# Patient Record
Sex: Male | Born: 1960
Health system: Southern US, Community
[De-identification: ages and names within clinical notes are randomized; demographics above are authoritative.]

## PROBLEM LIST (undated history)

## (undated) DIAGNOSIS — R519 Headache, unspecified: Secondary | ICD-10-CM

## (undated) DIAGNOSIS — Z973 Presence of spectacles and contact lenses: Secondary | ICD-10-CM

## (undated) DIAGNOSIS — R229 Localized swelling, mass and lump, unspecified: Secondary | ICD-10-CM

## (undated) DIAGNOSIS — IMO0002 Reserved for concepts with insufficient information to code with codable children: Secondary | ICD-10-CM

## (undated) DIAGNOSIS — R51 Headache: Secondary | ICD-10-CM

## (undated) DIAGNOSIS — Z923 Personal history of irradiation: Secondary | ICD-10-CM

## (undated) HISTORY — PX: WISDOM TOOTH EXTRACTION: SHX21

---
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
## 2009-01-26 ENCOUNTER — Ambulatory Visit: Payer: Self-pay | Admitting: Licensed Clinical Social Worker

## 2009-02-09 ENCOUNTER — Ambulatory Visit: Payer: Self-pay | Admitting: Licensed Clinical Social Worker

## 2009-02-16 ENCOUNTER — Ambulatory Visit: Payer: Self-pay | Admitting: Licensed Clinical Social Worker

## 2009-02-23 ENCOUNTER — Ambulatory Visit: Payer: Self-pay | Admitting: Licensed Clinical Social Worker

## 2009-03-02 ENCOUNTER — Ambulatory Visit: Payer: Self-pay | Admitting: Licensed Clinical Social Worker

## 2009-03-16 ENCOUNTER — Ambulatory Visit: Payer: Self-pay | Admitting: Licensed Clinical Social Worker

## 2017-03-20 DIAGNOSIS — C109 Malignant neoplasm of oropharynx, unspecified: Secondary | ICD-10-CM

## 2017-03-20 HISTORY — DX: Malignant neoplasm of oropharynx, unspecified: C10.9

## 2017-06-20 DIAGNOSIS — H524 Presbyopia: Secondary | ICD-10-CM | POA: Diagnosis not present

## 2017-10-26 ENCOUNTER — Encounter: Payer: Self-pay | Admitting: Family Medicine

## 2017-10-26 ENCOUNTER — Ambulatory Visit: Payer: BLUE CROSS/BLUE SHIELD | Admitting: Family Medicine

## 2017-10-26 VITALS — BP 120/76 | HR 71 | Temp 97.6°F | Resp 12 | Ht 71.0 in | Wt 174.2 lb

## 2017-10-26 DIAGNOSIS — Z0000 Encounter for general adult medical examination without abnormal findings: Secondary | ICD-10-CM | POA: Diagnosis not present

## 2017-10-26 DIAGNOSIS — J358 Other chronic diseases of tonsils and adenoids: Secondary | ICD-10-CM | POA: Diagnosis not present

## 2017-10-26 DIAGNOSIS — R590 Localized enlarged lymph nodes: Secondary | ICD-10-CM

## 2017-10-26 LAB — CBC WITH DIFFERENTIAL/PLATELET
Basophils Absolute: 0 10*3/uL (ref 0.0–0.1)
Basophils Relative: 0.4 % (ref 0.0–3.0)
Eosinophils Absolute: 0.1 10*3/uL (ref 0.0–0.7)
Eosinophils Relative: 0.9 % (ref 0.0–5.0)
HCT: 45.2 % (ref 39.0–52.0)
Hemoglobin: 15.3 g/dL (ref 13.0–17.0)
Lymphocytes Relative: 18.8 % (ref 12.0–46.0)
Lymphs Abs: 1.1 10*3/uL (ref 0.7–4.0)
MCHC: 33.7 g/dL (ref 30.0–36.0)
MCV: 85 fl (ref 78.0–100.0)
Monocytes Absolute: 0.4 10*3/uL (ref 0.1–1.0)
Monocytes Relative: 7.2 % (ref 3.0–12.0)
Neutro Abs: 4.4 10*3/uL (ref 1.4–7.7)
Neutrophils Relative %: 72.7 % (ref 43.0–77.0)
Platelets: 180 10*3/uL (ref 150.0–400.0)
RBC: 5.32 Mil/uL (ref 4.22–5.81)
RDW: 13.8 % (ref 11.5–15.5)
WBC: 6.1 10*3/uL (ref 4.0–10.5)

## 2017-10-26 LAB — POCT RAPID STREP A (OFFICE): Rapid Strep A Screen: NEGATIVE

## 2017-10-26 MED ORDER — AMOXICILLIN 500 MG PO CAPS: 500.0000 mg | ORAL_CAPSULE | Freq: Two times a day (BID) | ORAL | 0 refills | Status: AC

## 2017-10-26 NOTE — Patient Instructions (Addendum)
A few things to remember from today's visit:   Cervical adenopathy - Plan: amoxicillin (AMOXIL) 500 MG capsule, CBC with Differential/Platelet  Tonsillar erythema - Plan: POC Rapid Strep A, Culture, Group A Strep   Please be sure medication list is accurate. If a new problem present, please set up appointment sooner than planned today.

## 2017-10-26 NOTE — Progress Notes (Signed)
HPI:   Thomas Mccall is a 57 y.o. male, who is here today to establish care.  Former PCP: N/A Last preventive routine visit: Has not had one in many years.  Chronic medical problems: Otherwise healthy.Denies Hx of DM,HLD,or HTN. He takes no medications.   Concerns today: "Lump" in right side of neck, which he noted about 2 weeks ago. It has been the same size since he noted lesion. He states that he has not had significant discomfort. Initially it was mildly tender.  He thinks he may have some sore throat 2 weeks ago but has resolved.  He denies fever, chills, night sweats, changes in appetite, stridor, wheezing, cough, or abnormal weight loss.  He has not taken OTC medication.    Review of Systems  Constitutional: Negative for appetite change, chills, fatigue and fever.  HENT: Negative for congestion, ear pain, mouth sores, nosebleeds, trouble swallowing and voice change.   Eyes: Negative for photophobia and pain.  Respiratory: Negative for cough, shortness of breath and wheezing.   Cardiovascular: Negative for chest pain and leg swelling.  Gastrointestinal: Negative for abdominal pain, nausea and vomiting.       No changes in bowel habits.   Genitourinary: Negative for decreased urine volume and hematuria.  Musculoskeletal: Negative for gait problem, myalgias and neck pain.  Skin: Negative for rash and wound.  Allergic/Immunologic: Negative for environmental allergies.  Neurological: Negative for syncope, weakness and headaches.  Hematological: Positive for adenopathy. Does not bruise/bleed easily.      No current outpatient medications on file prior to visit.   No current facility-administered medications on file prior to visit.      History reviewed. No pertinent past medical history. No Known Allergies  Family History  Problem Relation Age of Onset  . Heart attack Maternal Grandmother   . Heart attack Maternal Grandfather   . Heart  attack Paternal Grandmother   . Heart attack Paternal Grandfather     Social History   Socioeconomic History  . Marital status: Married    Spouse name: Not on file  . Number of children: Not on file  . Years of education: Not on file  . Highest education level: Not on file  Occupational History  . Not on file  Social Needs  . Financial resource strain: Not on file  . Food insecurity:    Worry: Not on file    Inability: Not on file  . Transportation needs:    Medical: Not on file    Non-medical: Not on file  Tobacco Use  . Smoking status: Never Smoker  . Smokeless tobacco: Never Used  Substance and Sexual Activity  . Alcohol use: Not Currently  . Drug use: Never  . Sexual activity: Yes    Partners: Female  Lifestyle  . Physical activity:    Days per week: Not on file    Minutes per session: Not on file  . Stress: Not on file  Relationships  . Social connections:    Talks on phone: Not on file    Gets together: Not on file    Attends religious service: Not on file    Active member of club or organization: Not on file    Attends meetings of clubs or organizations: Not on file    Relationship status: Not on file  Other Topics Concern  . Not on file  Social History Narrative  . Not on file    Vitals:   10/26/17 1419  BP: 120/76  Pulse: 71  Resp: 12  Temp: 97.6 F (36.4 C)  SpO2: 96%    Body mass index is 24.3 kg/m.   Physical Exam  Nursing note and vitals reviewed. Constitutional: He is oriented to person, place, and time. He appears well-developed and well-nourished. He does not appear ill. No distress.  HENT:  Head: Normocephalic and atraumatic.  Right Ear: Tympanic membrane, external ear and ear canal normal.  Left Ear: Tympanic membrane, external ear and ear canal normal.  Nose: Rhinorrhea present. Right sinus exhibits no maxillary sinus tenderness and no frontal sinus tenderness. Left sinus exhibits no maxillary sinus tenderness and no frontal  sinus tenderness.  Mouth/Throat: Uvula is midline and mucous membranes are normal. No posterior oropharyngeal edema or tonsillar abscesses.    Right tonsil hypertrophic, erythematous, and with exudate.  Eyes: Pupils are equal, round, and reactive to light. Conjunctivae are normal.  Neck: No tracheal tenderness present. No tracheal deviation present.    Cardiovascular: Normal rate and regular rhythm.  No murmur heard. Respiratory: Effort normal and breath sounds normal. No stridor. No respiratory distress.  GI: Soft. He exhibits no mass. There is no hepatomegaly. There is no tenderness.  Musculoskeletal: He exhibits no edema.  Lymphadenopathy:       Head (right side): Submandibular adenopathy present. No preauricular and no posterior auricular adenopathy present.       Head (left side): No submandibular, no preauricular and no posterior auricular adenopathy present.    He has no cervical adenopathy.  About 3-3.5 cm nodular lesion in right submandibular area. Lesion is not very mobile, not tender. No skin erythema or thrush.   Neurological: He is alert and oriented to person, place, and time. He has normal strength. Gait normal.  Skin: Skin is warm. No rash noted. No erythema.  Psychiatric:  Flat affect,well groomed, good eye contact.     ASSESSMENT AND PLAN:   Thomas Mccall was seen today for establish care and mass.  Diagnoses and all orders for this visit:  Lab Results  Component Value Date   WBC 6.1 10/26/2017   HGB 15.3 10/26/2017   HCT 45.2 10/26/2017   MCV 85.0 10/26/2017   PLT 180.0 10/26/2017    Cervical adenopathy  Possible etiologies discussed, infectious vs neoplastic process.  Because right tonsillar changes, most likely related to infection. We discussed options: Abx treatment and monitoring Vs abx + neck imaging. He prefers to hold on neck CT for now. Some side effects of abx discussed.   -     amoxicillin (AMOXIL) 500 MG capsule; Take 1 capsule (500 mg  total) by mouth 2 (two) times daily for 10 days.  -     CBC with Differential/Platelet  Tonsillar erythema  Rapid strep here in office negative, it was sent for Cx. Will treat as strep tonsillitis. Further recommendations will be given according to lab results.  -     POC Rapid Strep A -     Culture, Group A Strep  Healthcare maintenance  He has not had colon cancer screening anf vaccines are not up to date. He is not interested in colonoscopy for now. Recommend scheduling a CPE.     Betty G. Martinique, MD  [facility]. [place] office.

## 2017-10-28 LAB — CULTURE, GROUP A STREP
MICRO NUMBER:: 90945870
SPECIMEN QUALITY:: ADEQUATE

## 2017-11-05 ENCOUNTER — Other Ambulatory Visit (HOSPITAL_COMMUNITY): Payer: Self-pay | Admitting: Otolaryngology

## 2017-11-05 DIAGNOSIS — R220 Localized swelling, mass and lump, head: Secondary | ICD-10-CM | POA: Diagnosis not present

## 2017-11-05 DIAGNOSIS — R591 Generalized enlarged lymph nodes: Secondary | ICD-10-CM | POA: Diagnosis not present

## 2017-11-05 DIAGNOSIS — J358 Other chronic diseases of tonsils and adenoids: Secondary | ICD-10-CM

## 2017-11-07 ENCOUNTER — Ambulatory Visit (HOSPITAL_COMMUNITY)
Admission: RE | Admit: 2017-11-07 | Discharge: 2017-11-07 | Disposition: A | Discharge status: Home / Self Care | Payer: BLUE CROSS/BLUE SHIELD | Source: Ambulatory Visit | Attending: Otolaryngology | Admitting: Otolaryngology

## 2017-11-07 DIAGNOSIS — R590 Localized enlarged lymph nodes: Secondary | ICD-10-CM | POA: Insufficient documentation

## 2017-11-07 DIAGNOSIS — J358 Other chronic diseases of tonsils and adenoids: Secondary | ICD-10-CM

## 2017-11-07 DIAGNOSIS — D104 Benign neoplasm of tonsil: Secondary | ICD-10-CM | POA: Diagnosis not present

## 2017-11-07 DIAGNOSIS — R220 Localized swelling, mass and lump, head: Secondary | ICD-10-CM

## 2017-11-07 MED ORDER — IOHEXOL 300 MG/ML  SOLN
100.0000 mL | Freq: Once | INTRAMUSCULAR | Status: AC | PRN
  Administered 2017-11-07: 75 mL via INTRAVENOUS

## 2017-11-08 ENCOUNTER — Other Ambulatory Visit: Payer: Self-pay | Admitting: Otolaryngology

## 2017-11-09 ENCOUNTER — Other Ambulatory Visit: Payer: Self-pay

## 2017-11-09 ENCOUNTER — Encounter (HOSPITAL_COMMUNITY): Payer: Self-pay | Admitting: *Deleted

## 2017-11-09 NOTE — Progress Notes (Signed)
Pt denies SOB, chest pain, and being under the care of a cardiologist. Pt denies having a stress test, echo and cardiac cath. Pt denies having an EKG and chest x ray within the last year. Pt made aware to stop taking Aspirin, vitamins, fish oil and herbal medications. Do not take any NSAIDs ie: Ibuprofen, Advil, Naproxen (Aleve), Motrin, BC and Goody Powder. Pt verbalized understanding of all pre-op instructions.

## 2017-11-12 ENCOUNTER — Ambulatory Visit (HOSPITAL_COMMUNITY): Payer: BLUE CROSS/BLUE SHIELD | Admitting: Certified Registered Nurse Anesthetist

## 2017-11-12 ENCOUNTER — Ambulatory Visit (HOSPITAL_COMMUNITY)
Admission: RE | Admit: 2017-11-12 | Discharge: 2017-11-12 | Disposition: A | Discharge status: Home / Self Care | Payer: BLUE CROSS/BLUE SHIELD | Source: Ambulatory Visit | Attending: Otolaryngology | Admitting: Otolaryngology

## 2017-11-12 ENCOUNTER — Encounter (HOSPITAL_COMMUNITY)
Admission: RE | Disposition: A | Discharge status: Home / Self Care | Payer: Self-pay | Source: Ambulatory Visit | Attending: Otolaryngology

## 2017-11-12 ENCOUNTER — Other Ambulatory Visit: Payer: Self-pay

## 2017-11-12 ENCOUNTER — Encounter (HOSPITAL_COMMUNITY): Payer: Self-pay | Admitting: Surgery

## 2017-11-12 ENCOUNTER — Other Ambulatory Visit: Payer: Self-pay | Admitting: Otolaryngology

## 2017-11-12 DIAGNOSIS — Z8249 Family history of ischemic heart disease and other diseases of the circulatory system: Secondary | ICD-10-CM | POA: Diagnosis not present

## 2017-11-12 DIAGNOSIS — J359 Chronic disease of tonsils and adenoids, unspecified: Secondary | ICD-10-CM | POA: Diagnosis present

## 2017-11-12 DIAGNOSIS — J358 Other chronic diseases of tonsils and adenoids: Secondary | ICD-10-CM | POA: Diagnosis not present

## 2017-11-12 DIAGNOSIS — C099 Malignant neoplasm of tonsil, unspecified: Secondary | ICD-10-CM | POA: Diagnosis not present

## 2017-11-12 HISTORY — DX: Reserved for concepts with insufficient information to code with codable children: IMO0002

## 2017-11-12 HISTORY — DX: Presence of spectacles and contact lenses: Z97.3

## 2017-11-12 HISTORY — DX: Headache: R51

## 2017-11-12 HISTORY — DX: Localized swelling, mass and lump, unspecified: R22.9

## 2017-11-12 HISTORY — PX: DIRECT LARYNGOSCOPY: SHX5326

## 2017-11-12 HISTORY — DX: Headache, unspecified: R51.9

## 2017-11-12 LAB — HEMOGLOBIN: Hemoglobin: 15.4 g/dL (ref 13.0–17.0)

## 2017-11-12 SURGERY — LARYNGOSCOPY, DIRECT
Anesthesia: General | Laterality: Right

## 2017-11-12 MED ORDER — EPINEPHRINE HCL (NASAL) 0.1 % NA SOLN
NASAL | Status: AC
  Filled 2017-11-12: qty 30

## 2017-11-12 MED ORDER — PROPOFOL 10 MG/ML IV BOLUS
INTRAVENOUS | Status: AC
  Filled 2017-11-12: qty 20

## 2017-11-12 MED ORDER — PROPOFOL 10 MG/ML IV BOLUS
INTRAVENOUS | Status: DC | PRN
  Administered 2017-11-12: 200 mg via INTRAVENOUS

## 2017-11-12 MED ORDER — ONDANSETRON HCL 4 MG/2ML IJ SOLN
INTRAMUSCULAR | Status: DC | PRN
  Administered 2017-11-12: 4 mg via INTRAVENOUS

## 2017-11-12 MED ORDER — CHLORHEXIDINE GLUCONATE CLOTH 2 % EX PADS: 6.0000 | MEDICATED_PAD | Freq: Once | CUTANEOUS | Status: DC

## 2017-11-12 MED ORDER — PHENYLEPHRINE 40 MCG/ML (10ML) SYRINGE FOR IV PUSH (FOR BLOOD PRESSURE SUPPORT)
PREFILLED_SYRINGE | INTRAVENOUS | Status: AC
  Filled 2017-11-12: qty 10

## 2017-11-12 MED ORDER — MIDAZOLAM HCL 2 MG/2ML IJ SOLN
INTRAMUSCULAR | Status: AC
  Filled 2017-11-12: qty 2

## 2017-11-12 MED ORDER — ROCURONIUM BROMIDE 50 MG/5ML IV SOSY
PREFILLED_SYRINGE | INTRAVENOUS | Status: AC
  Filled 2017-11-12: qty 5

## 2017-11-12 MED ORDER — SUGAMMADEX SODIUM 500 MG/5ML IV SOLN
INTRAVENOUS | Status: AC
  Filled 2017-11-12: qty 5

## 2017-11-12 MED ORDER — MIDAZOLAM HCL 5 MG/5ML IJ SOLN
INTRAMUSCULAR | Status: DC | PRN
  Administered 2017-11-12: 2 mg via INTRAVENOUS

## 2017-11-12 MED ORDER — PHENYLEPHRINE HCL 10 MG/ML IJ SOLN
INTRAMUSCULAR | Status: AC
  Filled 2017-11-12: qty 1

## 2017-11-12 MED ORDER — FENTANYL CITRATE (PF) 250 MCG/5ML IJ SOLN
INTRAMUSCULAR | Status: AC
  Filled 2017-11-12: qty 5

## 2017-11-12 MED ORDER — CELECOXIB 200 MG PO CAPS
ORAL_CAPSULE | ORAL | Status: AC
  Administered 2017-11-12: 200 mg via ORAL
  Filled 2017-11-12: qty 1

## 2017-11-12 MED ORDER — SUCCINYLCHOLINE CHLORIDE 20 MG/ML IJ SOLN
INTRAMUSCULAR | Status: DC | PRN
  Administered 2017-11-12: 100 mg via INTRAVENOUS

## 2017-11-12 MED ORDER — DEXAMETHASONE SODIUM PHOSPHATE 10 MG/ML IJ SOLN
INTRAMUSCULAR | Status: DC | PRN
  Administered 2017-11-12: 10 mg via INTRAVENOUS

## 2017-11-12 MED ORDER — ACETAMINOPHEN 500 MG PO TABS
1000.0000 mg | ORAL_TABLET | Freq: Once | ORAL | Status: AC
  Administered 2017-11-12: 1000 mg via ORAL

## 2017-11-12 MED ORDER — HYDROMORPHONE HCL 1 MG/ML IJ SOLN: 0.2500 mg | INTRAMUSCULAR | Status: DC | PRN

## 2017-11-12 MED ORDER — OXYCODONE HCL 5 MG/5ML PO SOLN: 5.0000 mg | Freq: Once | ORAL | Status: DC | PRN

## 2017-11-12 MED ORDER — SUCCINYLCHOLINE CHLORIDE 200 MG/10ML IV SOSY
PREFILLED_SYRINGE | INTRAVENOUS | Status: AC
  Filled 2017-11-12: qty 10

## 2017-11-12 MED ORDER — ACETAMINOPHEN 160 MG/5ML PO SOLN: 960.0000 mg | Freq: Once | ORAL | Status: AC

## 2017-11-12 MED ORDER — 0.9 % SODIUM CHLORIDE (POUR BTL) OPTIME
TOPICAL | Status: DC | PRN
  Administered 2017-11-12: 1000 mL

## 2017-11-12 MED ORDER — LACTATED RINGERS IV SOLN
INTRAVENOUS | Status: DC
  Administered 2017-11-12: 11:00:00 via INTRAVENOUS

## 2017-11-12 MED ORDER — OXYMETAZOLINE HCL 0.05 % NA SOLN
NASAL | Status: AC
  Filled 2017-11-12: qty 15

## 2017-11-12 MED ORDER — PROMETHAZINE HCL 25 MG/ML IJ SOLN: 6.2500 mg | INTRAMUSCULAR | Status: DC | PRN

## 2017-11-12 MED ORDER — CELECOXIB 200 MG PO CAPS
200.0000 mg | ORAL_CAPSULE | Freq: Once | ORAL | Status: AC | PRN
  Administered 2017-11-12: 200 mg via ORAL

## 2017-11-12 MED ORDER — FENTANYL CITRATE (PF) 100 MCG/2ML IJ SOLN
INTRAMUSCULAR | Status: DC | PRN
  Administered 2017-11-12: 100 ug via INTRAVENOUS

## 2017-11-12 MED ORDER — ONDANSETRON HCL 4 MG/2ML IJ SOLN
INTRAMUSCULAR | Status: AC
  Filled 2017-11-12: qty 2

## 2017-11-12 MED ORDER — OXYCODONE HCL 5 MG PO TABS: 5.0000 mg | ORAL_TABLET | Freq: Once | ORAL | Status: DC | PRN

## 2017-11-12 MED ORDER — EPINEPHRINE HCL (NASAL) 0.1 % NA SOLN
NASAL | Status: DC | PRN
  Administered 2017-11-12: 1 mL via TOPICAL

## 2017-11-12 MED ORDER — EPHEDRINE 5 MG/ML INJ
INTRAVENOUS | Status: AC
  Filled 2017-11-12: qty 10

## 2017-11-12 MED ORDER — DEXAMETHASONE SODIUM PHOSPHATE 10 MG/ML IJ SOLN
INTRAMUSCULAR | Status: AC
  Filled 2017-11-12: qty 2

## 2017-11-12 MED ORDER — LIDOCAINE 2% (20 MG/ML) 5 ML SYRINGE
INTRAMUSCULAR | Status: AC
  Filled 2017-11-12: qty 25

## 2017-11-12 MED ORDER — ACETAMINOPHEN 500 MG PO TABS
ORAL_TABLET | ORAL | Status: AC
  Administered 2017-11-12: 1000 mg via ORAL
  Filled 2017-11-12: qty 2

## 2017-11-12 MED ORDER — LIDOCAINE HCL (CARDIAC) PF 100 MG/5ML IV SOSY
PREFILLED_SYRINGE | INTRAVENOUS | Status: DC | PRN
  Administered 2017-11-12: 25 mg via INTRAVENOUS
  Administered 2017-11-12: 25 mg via INTRATRACHEAL

## 2017-11-12 SURGICAL SUPPLY — 20 items
CANISTER SUCT 3000ML PPV (MISCELLANEOUS) ×2 IMPLANT
COVER BACK TABLE 60X90IN (DRAPES) ×2 IMPLANT
COVER MAYO STAND STRL (DRAPES) ×2 IMPLANT
CRADLE DONUT ADULT HEAD (MISCELLANEOUS) IMPLANT
DRAPE HALF SHEET 40X57 (DRAPES) ×2 IMPLANT
DRSG TELFA 3X8 NADH (GAUZE/BANDAGES/DRESSINGS) ×2 IMPLANT
GAUZE 4X4 16PLY RFD (DISPOSABLE) IMPLANT
GLOVE ECLIPSE 7.5 STRL STRAW (GLOVE) ×2 IMPLANT
GUARD TEETH (MISCELLANEOUS) ×2 IMPLANT
KIT BASIN OR (CUSTOM PROCEDURE TRAY) ×2 IMPLANT
KIT TURNOVER KIT B (KITS) ×2 IMPLANT
NEEDLE HYPO 25GX1X1/2 BEV (NEEDLE) IMPLANT
NS IRRIG 1000ML POUR BTL (IV SOLUTION) ×2 IMPLANT
PAD ARMBOARD 7.5X6 YLW CONV (MISCELLANEOUS) ×4 IMPLANT
PATTIES SURGICAL .5 X3 (DISPOSABLE) ×2 IMPLANT
SOLUTION ANTI FOG 6CC (MISCELLANEOUS) IMPLANT
SPECIMEN JAR SMALL (MISCELLANEOUS) IMPLANT
SURGILUBE 2OZ TUBE FLIPTOP (MISCELLANEOUS) IMPLANT
TOWEL OR 17X24 6PK STRL BLUE (TOWEL DISPOSABLE) ×4 IMPLANT
TUBE CONNECTING 12X1/4 (SUCTIONS) ×2 IMPLANT

## 2017-11-12 NOTE — Anesthesia Preprocedure Evaluation (Signed)
Anesthesia Evaluation  Patient identified by MRN, date of birth, ID band Patient awake    Reviewed: Allergy & Precautions, NPO status , Patient's Chart, lab work & pertinent test results  Airway Mallampati: II  TM Distance: >3 FB Neck ROM: Full    Dental no notable dental hx.    Pulmonary neg pulmonary ROS,    Pulmonary exam normal breath sounds clear to auscultation       Cardiovascular negative cardio ROS Normal cardiovascular exam Rhythm:Regular Rate:Normal     Neuro/Psych  Headaches, negative psych ROS   GI/Hepatic negative GI ROS, Neg liver ROS,   Endo/Other  negative endocrine ROS  Renal/GU negative Renal ROS     Musculoskeletal negative musculoskeletal ROS (+)   Abdominal   Peds  Hematology negative hematology ROS (+)   Anesthesia Other Findings  Right tonsil mass  Reproductive/Obstetrics                             Anesthesia Physical Anesthesia Plan  ASA: II  Anesthesia Plan: General   Post-op Pain Management:    Induction: Intravenous  PONV Risk Score and Plan: 2 and Ondansetron, Dexamethasone, Midazolam and Treatment may vary due to age or medical condition  Airway Management Planned: Oral ETT  Additional Equipment:   Intra-op Plan:   Post-operative Plan: Extubation in OR  Informed Consent: I have reviewed the patients History and Physical, chart, labs and discussed the procedure including the risks, benefits and alternatives for the proposed anesthesia with the patient or authorized representative who has indicated his/her understanding and acceptance.   Dental advisory given  Plan Discussed with: CRNA  Anesthesia Plan Comments:         Anesthesia Quick Evaluation

## 2017-11-12 NOTE — Transfer of Care (Signed)
Immediate Anesthesia Transfer of Care Note  Patient: Thomas Mccall  Procedure(s) Performed: DIRECT LARYNGOSCOPY (Right )  Patient Location: PACU  Anesthesia Type:General  Level of Consciousness: awake, alert , oriented and patient cooperative  Airway & Oxygen Therapy: Patient Spontanous Breathing and Patient connected to nasal cannula oxygen  Post-op Assessment: Report given to RN and Post -op Vital signs reviewed and stable  Post vital signs: Reviewed and stable  Last Vitals:  Vitals Value Taken Time  BP 124/76 11/12/2017 12:09 PM  Temp    Pulse 82 11/12/2017 12:13 PM  Resp 28 11/12/2017 12:13 PM  SpO2 94 % 11/12/2017 12:13 PM  Vitals shown include unvalidated device data.  Last Pain:  Vitals:   11/12/17 0915  TempSrc:   PainSc: 0-No pain      Patients Stated Pain Goal: 4 (03/88/82 8003)  Complications: No apparent anesthesia complications

## 2017-11-12 NOTE — Op Note (Signed)
Preop/postop diagnosis: Right tonsil and neck mass Procedure: Direct laryngoscopy with biopsy of right tonsil Anesthesia: Gen. Estimated : Less than  10 mL Indications: 57 year old with a right tonsil/neck mass. CT scan showed a 2-1/2 cm mass in the tonsil along with a 3.9 cm mass in the right neck. All this is consistent with a carcinoma. He was informed risks and benefits of the procedure and options were discussed all questions are answered and consent was obtained.  Procedure: Patient is taken the operating room placed in the supine addition after general endotracheal tube anesthesia was examined with the Dedo scope. The hypopharynx and larynx were without evidence of any lesions or masses.The post cricoid area did not appear to have any lesions. Lateral pharyngeal wall was clear. The base of tongue did not appear to have any mass. The left tonsil looked within normal limits. The right tonsil had a obvious irregular exophytic mass that was bloody apparently injured from the intubation. The biopsy was taken from the superior pole and medial pole of the tonsil and sent for pathology. Bleeding was controlled with a adrenaline soaked pledget.there was good hemostasis. The patient was awake and brought to recovery in stable condition counts correct      t

## 2017-11-12 NOTE — Anesthesia Procedure Notes (Signed)
Procedure Name: Intubation Date/Time: 11/12/2017 11:33 AM Performed by: Shirlyn Goltz, CRNA Pre-anesthesia Checklist: Patient identified, Emergency Drugs available, Suction available and Patient being monitored Patient Re-evaluated:Patient Re-evaluated prior to induction Oxygen Delivery Method: Circle system utilized Preoxygenation: Pre-oxygenation with 100% oxygen Induction Type: IV induction Ventilation: Mask ventilation without difficulty and Oral airway inserted - appropriate to patient size Laryngoscope Size: Mac and 4 Grade View: Grade II Tube type: Oral Tube size: 7.0 mm Number of attempts: 1 Airway Equipment and Method: Stylet and LTA kit utilized Placement Confirmation: ETT inserted through vocal cords under direct vision,  positive ETCO2 and breath sounds checked- equal and bilateral Secured at: 24 cm Tube secured with: Tape Dental Injury: Teeth and Oropharynx as per pre-operative assessment

## 2017-11-12 NOTE — H&P (Signed)
Thomas Mccall is an 57 y.o. male.   Chief Complaint: right neck mass HPI: hx of mass in the right neck, he has treated with abx and has a persistent, he is here for biosy  Past Medical History:  Diagnosis Date  . Headache   . Mass    right tonsil  . Wears glasses     Past Surgical History:  Procedure Laterality Date  . TONSILLECTOMY AND ADENOIDECTOMY  1970  . WISDOM TOOTH EXTRACTION      Family History  Problem Relation Age of Onset  . Heart attack Maternal Grandmother   . Heart attack Maternal Grandfather   . Heart attack Paternal Grandmother   . Heart attack Paternal Grandfather    Social History:  reports that he has never smoked. He has never used smokeless tobacco. He reports that he drank alcohol. He reports that he does not use drugs.  Allergies: No Known Allergies  No medications prior to admission.    Results for orders placed or performed during the hospital encounter of 11/12/17 (from the past 48 hour(s))  Hemoglobin     Status: None   Collection Time: 11/12/17  9:29 AM  Result Value Ref Range   Hemoglobin 15.4 13.0 - 17.0 g/dL    Comment: Performed at [facility], [place] [address] [place], [place] [address]   No results found.  Review of Systems  Constitutional: Negative.   HENT: Negative.   Eyes: Negative.   Respiratory: Negative.   Cardiovascular: Negative.   Skin: Negative.     Blood pressure 137/81, pulse 72, temperature 97.9 F (36.6 C), temperature source Oral, resp. rate 18, height 5\' 10"  (1.778 m), weight 77.1 kg, SpO2 99 %. Physical Exam  Constitutional: He appears well-developed and well-nourished.  HENT:  Head: Normocephalic and atraumatic.  Nose: Nose normal.  Eyes: Pupils are equal, round, and reactive to light. Conjunctivae are normal.  Neck: Normal range of motion. Neck supple.  Cardiovascular: Normal rate.  Respiratory: Effort normal.  GI: Soft.  Musculoskeletal: Normal range of motion.  Lymphadenopathy:    He has  cervical adenopathy.     Assessment/Plan Right tonsil/neck mass- discussed direct laryngoscopy and ready to proceed  Melissa Montane, MD 11/12/2017, 11:04 AM

## 2017-11-12 NOTE — Anesthesia Postprocedure Evaluation (Signed)
Anesthesia Post Note  Patient: Thomas Mccall  Procedure(s) Performed: DIRECT LARYNGOSCOPY (Right )     Patient location during evaluation: PACU Anesthesia Type: General Level of consciousness: awake and alert Pain management: pain level controlled Vital Signs Assessment: post-procedure vital signs reviewed and stable Respiratory status: spontaneous breathing, nonlabored ventilation, respiratory function stable and patient connected to nasal cannula oxygen Cardiovascular status: blood pressure returned to baseline and stable Postop Assessment: no apparent nausea or vomiting Anesthetic complications: no    Last Vitals:  Vitals:   11/12/17 1255 11/12/17 1333  BP: 125/77 128/81  Pulse: 66 66  Resp: 18 17  Temp: 37 C   SpO2: 98% 98%    Last Pain:  Vitals:   11/12/17 1333  TempSrc:   PainSc: 0-No pain                 Hadlei Stitt P Ezana Hubbert

## 2017-11-13 ENCOUNTER — Encounter (HOSPITAL_COMMUNITY): Payer: Self-pay | Admitting: Otolaryngology

## 2017-11-13 DIAGNOSIS — R220 Localized swelling, mass and lump, head: Secondary | ICD-10-CM | POA: Diagnosis not present

## 2017-11-13 DIAGNOSIS — R591 Generalized enlarged lymph nodes: Secondary | ICD-10-CM | POA: Diagnosis not present

## 2017-11-16 ENCOUNTER — Telehealth: Payer: Self-pay | Admitting: *Deleted

## 2017-11-16 NOTE — Telephone Encounter (Signed)
Oncology Nurse Navigator Documentation  Rec'd forwarded call from pt's wife.    She indicated her husband rec'd diagnosis of HPV positive tonsil cancer from ENT Janace Hoard, was told yesterday by his office referral to [facility] being issued.  I answered her questions re tmt, prognosis.  She indicated husband saw dentist last week for first time in many years, was told needed root canal, deep cleaning.  I explained role of pre-radiotherapy dental evaluation, explained will be scheduled in conjunction with pending appt with Dr. Isidore Moos.  She acknowledged PET to be scheduled by Dr. Janace Hoard' office.  I explained RadOnc and MedOnc appts to be scheduled s/p PET, I encouraged consideration of [facility] to optimize scheduling.  She indicated she was going to follow-up with Dr. Janace Hoard' office.  She voiced understanding I will be tracking referral progress.  I provided my contact information, encouraged her/husband to call me. She voiced appreciation for information and availability of direct contact.  Gayleen Orem, RN, BSN Head & Neck Oncology Nurse [place] at [place] [phone]

## 2017-11-20 ENCOUNTER — Telehealth: Payer: Self-pay | Admitting: *Deleted

## 2017-11-20 NOTE — Telephone Encounter (Signed)
Oncology Nurse Navigator Documentation  Pt wife called indicating 9/10 0930 appt scheduled earlier conflicts with appt same day with endodontist to discuss root canal.  I provided 9/13 0730 appt, informed her appt with Dental Medicine to be scheduled same morning.  I explained above appts contingent on PET completion.  She indicated pre-authorization for PET has been requested for [facility] for more timely scheduling.  I indicated appt with Medical Oncology to be scheduled upon confirmation of PET appt.  Gayleen Orem, RN, BSN Head & Neck Oncology Nurse [place] at [place] [phone]

## 2017-11-21 ENCOUNTER — Other Ambulatory Visit: Payer: Self-pay | Admitting: Radiation Oncology

## 2017-11-21 ENCOUNTER — Encounter: Payer: BLUE CROSS/BLUE SHIELD | Admitting: Family Medicine

## 2017-11-21 DIAGNOSIS — C090 Malignant neoplasm of tonsillar fossa: Secondary | ICD-10-CM

## 2017-11-21 DIAGNOSIS — M899 Disorder of bone, unspecified: Secondary | ICD-10-CM

## 2017-11-21 NOTE — Progress Notes (Signed)
Head and Neck Cancer Location of Tumor / Histology:  11/12/17 Diagnosis Tonsil, biopsy, Right - INVASIVE SQUAMOUS CELL CARCINOMA. SEE NOTE. Immunohistochemical stain for p16 is diffusely positive  Patient presented months ago with symptoms of: He reported a sore throat for 3 months and right neck mass to Dr. Redmond Baseman on 10/30/17.    Biopsies of right tonsil revealed: Invasive squamous cell carcinoma  Nutrition Status Yes No Comments  Weight changes? [x]  []  10 pounds per wife  Swallowing concerns? []  [x]    PEG? []  [x]     Referrals Yes No Comments  Social Work? []  [x]    Dentistry? []  []    Swallowing therapy? []  []    Nutrition? []  []    Med/Onc? [x]  []  Dr. Maylon Peppers 12/05/17   Safety Issues Yes No Comments  Prior radiation? []  [x]    Pacemaker/ICD? []  [x]    Possible current pregnancy? []  [x]    Is the patient on methotrexate? []  [x]     Tobacco/Marijuana/Snuff/ETOH use: He has never smoked. He does not drink alcohol.   Past/Anticipated interventions by otolaryngology, if any:  11/12/17 Preop/postop diagnosis: Right tonsil and neck mass Procedure: Direct laryngoscopy with biopsy of right tonsil Dr. Janace Hoard  Past/Anticipated interventions by medical oncology, if any:  12/05/17 Dr. Maylon Peppers    Current Complaints / other details:  Pt presents today for initial consult with Dr. Isidore Moos for Radiation Oncology. Pt is accompanied by wife. Pt offered SW consult; pt declined. Pt is anxious for upcoming treatment, and adjusting to illness.  Loma Sousa, RN BSN

## 2017-11-22 ENCOUNTER — Other Ambulatory Visit: Payer: Self-pay | Admitting: *Deleted

## 2017-11-22 ENCOUNTER — Encounter: Payer: Self-pay | Admitting: Hematology

## 2017-11-22 ENCOUNTER — Telehealth: Payer: Self-pay | Admitting: *Deleted

## 2017-11-22 DIAGNOSIS — C099 Malignant neoplasm of tonsil, unspecified: Secondary | ICD-10-CM

## 2017-11-22 NOTE — Telephone Encounter (Signed)
Oncology Nurse Navigator Documentation  Confirmed via email appt dates/times for 9/11 PET, 9/13 Dental Medicine and 9/16 Dr. Maylon Peppers, [place].  Gayleen Orem, RN, BSN Head & Neck Oncology Nurse [place] at [place] [phone]

## 2017-11-23 ENCOUNTER — Telehealth: Payer: Self-pay | Admitting: *Deleted

## 2017-11-23 ENCOUNTER — Telehealth: Payer: Self-pay | Admitting: Hematology

## 2017-11-23 NOTE — Telephone Encounter (Signed)
Oncology Nurse Navigator Documentation  Notified patient and wife via email his MedOnc appt rescheduled for 9/18 1:00 from 9/16 1:00.  Acknowledgement  Received.  Gayleen Orem, RN, BSN Head & Neck Oncology Nurse [place] at [place] [phone]

## 2017-11-23 NOTE — Telephone Encounter (Signed)
Pt's appointment w/Dr. Maylon Peppers has been rescheduled to 9/18 at 1pm.

## 2017-11-27 ENCOUNTER — Telehealth: Payer: Self-pay | Admitting: *Deleted

## 2017-11-27 ENCOUNTER — Ambulatory Visit: Payer: BLUE CROSS/BLUE SHIELD | Admitting: Radiation Oncology

## 2017-11-27 NOTE — Telephone Encounter (Signed)
Oncology Nurse Navigator Documentation  Called [name] to confirm understanding of 10:00 arrival/location for tomorrow morning's PET at [facility].   We discussed also Friday's appts starting 0730/0800 Dr. Isidore Moos, 1000 Dr. Enrique Sack. I answered his questions re likely treatment plan. I encouraged him to call me with questions/concerns.  Gayleen Orem, RN, BSN Head & Neck Oncology Nurse [place] at [place] [phone]

## 2017-11-28 ENCOUNTER — Ambulatory Visit
Admission: RE | Admit: 2017-11-28 | Discharge: 2017-11-28 | Disposition: A | Discharge status: Home / Self Care | Payer: BLUE CROSS/BLUE SHIELD | Source: Ambulatory Visit | Attending: Radiation Oncology | Admitting: Radiation Oncology

## 2017-11-28 ENCOUNTER — Telehealth: Payer: Self-pay | Admitting: *Deleted

## 2017-11-28 DIAGNOSIS — C090 Malignant neoplasm of tonsillar fossa: Secondary | ICD-10-CM | POA: Diagnosis not present

## 2017-11-28 DIAGNOSIS — M899 Disorder of bone, unspecified: Secondary | ICD-10-CM | POA: Diagnosis present

## 2017-11-28 LAB — GLUCOSE, CAPILLARY: Glucose-Capillary: 89 mg/dL (ref 70–99)

## 2017-11-28 MED ORDER — FLUDEOXYGLUCOSE F - 18 (FDG) INJECTION
8.8100 | Freq: Once | INTRAVENOUS | Status: AC | PRN
  Administered 2017-11-28: 8.81 via INTRAVENOUS

## 2017-11-28 NOTE — Telephone Encounter (Signed)
Oncology Nurse Navigator Documentation  Called pt and his wife with results of today's PET, reported confirmation of tonsil and single L neck LN involvement, no evidence of metastatic disease elsewhere.  The voiced appreciation for call.  Gayleen Orem, RN, BSN Head & Neck Oncology Nurse [place] at [place] [phone]

## 2017-11-29 NOTE — Telephone Encounter (Signed)
A user error has taken place: encounter opened in error, closed for administrative reasons.

## 2017-11-30 ENCOUNTER — Ambulatory Visit
Admission: RE | Admit: 2017-11-30 | Discharge: 2017-11-30 | Disposition: A | Discharge status: Home / Self Care | Payer: BLUE CROSS/BLUE SHIELD | Source: Ambulatory Visit | Attending: Radiation Oncology | Admitting: Radiation Oncology

## 2017-11-30 ENCOUNTER — Encounter: Payer: Self-pay | Admitting: *Deleted

## 2017-11-30 ENCOUNTER — Other Ambulatory Visit: Payer: Self-pay

## 2017-11-30 ENCOUNTER — Ambulatory Visit (HOSPITAL_COMMUNITY): Payer: Self-pay | Admitting: Dentistry

## 2017-11-30 ENCOUNTER — Encounter: Payer: Self-pay | Admitting: Radiation Oncology

## 2017-11-30 ENCOUNTER — Telehealth: Payer: Self-pay | Admitting: *Deleted

## 2017-11-30 ENCOUNTER — Encounter (HOSPITAL_COMMUNITY): Payer: Self-pay | Admitting: Dentistry

## 2017-11-30 ENCOUNTER — Other Ambulatory Visit: Payer: Self-pay | Admitting: Hematology

## 2017-11-30 VITALS — BP 124/77 | HR 67 | Temp 97.8°F

## 2017-11-30 VITALS — BP 122/80 | HR 63 | Temp 97.8°F | Resp 18 | Ht 70.0 in | Wt 173.2 lb

## 2017-11-30 DIAGNOSIS — M264 Malocclusion, unspecified: Secondary | ICD-10-CM

## 2017-11-30 DIAGNOSIS — K08409 Partial loss of teeth, unspecified cause, unspecified class: Secondary | ICD-10-CM

## 2017-11-30 DIAGNOSIS — M278 Other specified diseases of jaws: Secondary | ICD-10-CM

## 2017-11-30 DIAGNOSIS — M270 Developmental disorders of jaws: Secondary | ICD-10-CM

## 2017-11-30 DIAGNOSIS — K06010 Localized gingival recession, unspecified: Secondary | ICD-10-CM

## 2017-11-30 DIAGNOSIS — Z01818 Encounter for other preprocedural examination: Secondary | ICD-10-CM

## 2017-11-30 DIAGNOSIS — C090 Malignant neoplasm of tonsillar fossa: Secondary | ICD-10-CM | POA: Insufficient documentation

## 2017-11-30 DIAGNOSIS — J392 Other diseases of pharynx: Secondary | ICD-10-CM

## 2017-11-30 DIAGNOSIS — M2632 Excessive spacing of fully erupted teeth: Secondary | ICD-10-CM

## 2017-11-30 DIAGNOSIS — K036 Deposits [accretions] on teeth: Secondary | ICD-10-CM

## 2017-11-30 DIAGNOSIS — F40232 Fear of other medical care: Secondary | ICD-10-CM

## 2017-11-30 DIAGNOSIS — K0530 Chronic periodontitis, unspecified: Secondary | ICD-10-CM

## 2017-11-30 MED ORDER — SODIUM FLUORIDE 1.1 % DT CREA: TOPICAL_CREAM | DENTAL | 99 refills | Status: DC

## 2017-11-30 MED FILL — SF 5000 PLUS CREAM: 1.1 | 30 days supply | Qty: 51 | Fill #0

## 2017-11-30 NOTE — Patient Instructions (Signed)

## 2017-11-30 NOTE — Progress Notes (Signed)
Radiation Oncology         [phone] ________________________________  Initial outpatient Consultation  Name: Thomas Mccall MRN: [id number]  Date: 11/30/2017  DOB: 01-Oct-1960  [id number], Malka So, MD  Melissa Montane, MD   REFERRING PHYSICIAN: Melissa Montane, MD  DIAGNOSIS:    ICD-10-CM   1. Malignant neoplasm of tonsillar fossa ([place]) C09.0 Ambulatory referral to ENT  Cancer Staging Malignant neoplasm of tonsillar fossa ([place]) Staging form: Pharynx - HPV-Mediated Oropharynx, AJCC 8th Edition - Clinical stage from 11/30/2017: Stage I (cT2, cN1, cM0, p16+) - Signed by Eppie Gibson, MD on 11/30/2017   CHIEF COMPLAINT: Here to discuss management of tonsillar cancer  HISTORY OF PRESENT ILLNESS::Thomas Mccall is a 57 y.o. male who presented with sore throat for 3 months and right neck mass to Dr. Redmond Baseman on 10/30/2017.  Subsequently, the patient saw Dr. Janace Hoard. Biopsy of right tonsil on 11/12/2017 revealed: Invasive squamous cell carcinoma. Immunohistochemical stain for p16 is diffusely positive.  Pertinent imaging thus far includes CT Neck performed on 11/07/2017 revealing right tonsillar mass measuring up to 2.6 cm, highly concerning for squamous cell carcinoma. Also seen was an enlarged homogenous right level 2 node measuring up to 3.9 cm, consistent with metastatic disease. Adjacent right level 3 lymph node measures 1.1 cm. Lucency at C4 on the left is likely degenerative but metastatic disease is not excluded.    As well as PET scan performed on 11/28/2017 revealing right tonsillar fossa mass is intensely hypermetabolic compatible with primary neoplasm. Solitary enlarged and hypermetabolic right level 2 cervical lymph node compatible with metastatic adenopathy. No evidence for FDG avid hypermetabolic distant metastasis.  I personally reviewed his images.  Swallowing issues, if any: None. Although the patient does note a recent itching feeling with swallowing but he is not sure if this  is just him being hyper-aware of his cancer. Patient states this feeling does not affect his ability to swallow.  Weight Changes: The patient's wife reports 10 lbs weight loss. However the patient states he was actively trying to lose weight.  Pain status: He denies any pain at this time.   Tobacco history, if any: None  ETOH abuse, if any: None  Prior cancers, if any: None  PREVIOUS RADIATION THERAPY: No  PAST MEDICAL HISTORY:  has a past medical history of Headache, Mass, and Wears glasses.    PAST SURGICAL HISTORY: Past Surgical History:  Procedure Laterality Date  . DIRECT LARYNGOSCOPY Right 11/12/2017   Procedure: DIRECT LARYNGOSCOPY;  Surgeon: Melissa Montane, MD;  Location: [facility];  Service: ENT;  Laterality: Right;  Direct laryngoscopy and biopy of right tonsil mass  . TONSILLECTOMY AND ADENOIDECTOMY  1970  . WISDOM TOOTH EXTRACTION      FAMILY HISTORY: family history includes Heart attack in his maternal grandfather, maternal grandmother, paternal grandfather, and paternal grandmother.  SOCIAL HISTORY:  reports that he has never smoked. He has never used smokeless tobacco. He reports that he drank alcohol. He reports that he does not use drugs. Lives in [place]. Works with [employer]. Has two children, a 91 and 18 year-old.   ALLERGIES: Patient has no known allergies.  MEDICATIONS:  Current Outpatient Medications  Medication Sig Dispense Refill  . sodium fluoride (PREVIDENT 5000 PLUS) 1.1 % CREA dental cream Apply cream to tooth brush. Brush teeth for 2 minutes. Spit out excess. DO NOT rinse afterwards. Repeat nightly. 1 Tube prn   No current facility-administered medications for this encounter.     REVIEW OF SYSTEMS:  A 10+ POINT REVIEW OF SYSTEMS WAS OBTAINED including neurology, dermatology, psychiatry, cardiac, respiratory, lymph, extremities, GI, GU, Musculoskeletal, constitutional, breasts, reproductive, HEENT.  All pertinent positives are noted in the HPI.  All  others are negative.  PHYSICAL EXAM:  height is 5\' 10"  (1.778 m) and weight is 178 lb 6.4 oz (80.9 kg). His oral temperature is 97.8 F (36.6 C). His blood pressure is 122/80 and his pulse is 63. His respiration is 18 and oxygen saturation is 99%.   General: Alert and oriented, appears anxious. Accompanied by his wife. HEENT: Head is normocephalic. Extraocular movements are intact. Oropharynx is notable for missing molar in left upper jaw and the socket has not completely filled in. He has some metal fillings in his lower molars. He has an exudative mass arising from the right tonsil that I would estimate to be 3 cm in size. Neck: Neck is notable for a mass in level 2 of the right neck which I would estimate to be about 3.5 cm. No other masses palpated in the neck. No skin involvement noted in the neck by his cancer. Heart: Regular in rate and rhythm with no murmurs, rubs, or gallops. Chest: Clear to auscultation bilaterally, with no rhonchi, wheezes, or rales. Abdomen: Soft, nontender, nondistended, with no rigidity or guarding. Extremities: No cyanosis or edema. Lymphatics: see Neck Exam Skin: No concerning lesions. Musculoskeletal: symmetric strength and muscle tone throughout. Neurologic: Cranial nerves II through XII are grossly intact. No obvious focalities. Speech is fluent. Coordination is intact. Psychiatric: Judgment and insight are intact. Affect is appropriate.  ECOG = 0  0 - Asymptomatic (Fully active, able to carry on all predisease activities without restriction)  1 - Symptomatic but completely ambulatory (Restricted in physically strenuous activity but ambulatory and able to carry out work of a light or sedentary nature. For example, light housework, office work)  2 - Symptomatic, <50% in bed during the day (Ambulatory and capable of all self care but unable to carry out any work activities. Up and about more than 50% of waking hours)  3 - Symptomatic, >50% in bed, but not  bedbound (Capable of only limited self-care, confined to bed or chair 50% or more of waking hours)  4 - Bedbound (Completely disabled. Cannot carry on any self-care. Totally confined to bed or chair)  5 - Death   Eustace Pen MM, Creech RH, Tormey DC, et al. [id number]). "Toxicity and response criteria of the [facility]". [place] Oncol. 5 (6): 649-55   LABORATORY DATA:  Lab Results  Component Value Date   WBC 6.1 10/26/2017   HGB 15.4 11/12/2017   HCT 45.2 10/26/2017   MCV 85.0 10/26/2017   PLT 180.0 10/26/2017   CMP  No results found for: NA, K, CL, CO2, GLUCOSE, BUN, CREATININE, CALCIUM, PROT, ALBUMIN, AST, ALT, ALKPHOS, BILITOT, GFRNONAA, GFRAA    No results found for: TSH   RADIOGRAPHY: Ct Soft Tissue Neck W Contrast  Result Date: 11/07/2017 CLINICAL DATA:  Tonsillar mass. EXAM: CT NECK WITH CONTRAST TECHNIQUE: Multidetector CT imaging of the neck was performed using the standard protocol following the bolus administration of intravenous contrast. CONTRAST:  31mL OMNIPAQUE IOHEXOL 300 MG/ML  SOLN COMPARISON:  None. FINDINGS: Pharynx and larynx: A right tonsillar mass measures 2.2 x 1.5 x 2.6 cm. There is partial effacement of the parapharyngeal fat. Tongue base is otherwise within normal limits. The valleculae is clear. Epiglottis is normal. Hypopharynx is within normal limits. Vocal cords are midline  and symmetric. Salivary glands: The submandibular and parotid glands are within normal limits bilaterally. Thyroid: Normal Lymph nodes: Enlarged homogeneous right level 2 lymph node measures 3.9 x 2.8 x 2.2 cm. Adjacent 1.1 cm node is present just below the dominant node. No significant left-sided adenopathy is present. Vascular: No significant stenosis or vascular lesion is present. Limited intracranial: Within normal limits. Visualized orbits: Globes and orbits are within normal limits. Mastoids and visualized paranasal sinuses: The paranasal sinuses and mastoid air cells  are clear. Skeleton: Vertebral body heights alignment are normal. Endplate sclerotic changes are present C5-6 and C6-7. 5 mm lucent lesion subjacent to the superior endplate of C4 on the left raises some concern for metastatic disease to the bone. Upper chest: The lung apices are clear. Focal nodule, mass, or airspace disease is present. Thoracic inlet is within normal limits. Aorta is unremarkable. IMPRESSION: 1. Right tonsillar mass measures up to 2.6 cm, highly concerning for a squamous cell carcinoma. 2. Enlarged homogeneous right level 2 lymph node measures up to 3.9 cm, consistent with metastatic disease. 3. Adjacent right level 3 lymph node measures 1.1 cm. 4. Lucency at C4 on the left is likely degenerative. Metastatic disease is not excluded. Please correlate with findings on PET scan if used for further evaluation. Electronically Signed   By: San Morelle M.D.   On: 11/07/2017 17:00   Nm Pet Image Initial (pi) Skull Base To Thigh  Result Date: 11/28/2017 CLINICAL DATA:  Initial treatment strategy for right tonsillar fossa neoplasm. EXAM: NUCLEAR MEDICINE PET SKULL BASE TO THIGH TECHNIQUE: 8.81 mCi F-18 FDG was injected intravenously. Full-ring PET imaging was performed from the skull base to thigh after the radiotracer. CT data was obtained and used for attenuation correction and anatomic localization. Fasting blood glucose: 89 mg/dl COMPARISON:  None. FINDINGS: Mediastinal blood pool activity: SUV max 2.92 NECK: Hypermetabolic lesion within the right tonsillar fossa measures approximately 2.6 cm and has an SUV max equal to 11.03. Enlarged and hypermetabolic right level 2 lymph node measures 2.4 cm and has an SUV max of 10.14. No additional hypermetabolic cervical lymph nodes identified. Incidental CT findings: none CHEST: No hypermetabolic mediastinal or hilar nodes. No suspicious pulmonary nodules on the CT scan. No suspicious pulmonary nodules or masses identified. Incidental CT findings:  none ABDOMEN/PELVIS: No abnormal hypermetabolic activity within the liver, pancreas, adrenal glands, or spleen. No hypermetabolic lymph nodes in the abdomen or pelvis. Incidental CT findings: none SKELETON: No focal hypermetabolic activity to suggest skeletal metastasis. Incidental CT findings: none IMPRESSION: 1. Right tonsillar fossa mass is intensely hypermetabolic compatible with primary neoplasm. 2. Solitary enlarged and hypermetabolic right level 2 cervical lymph node compatible with metastatic adenopathy. 3. No evidence for FDG avid hypermetabolic distant metastasis. Electronically Signed   By: Kerby Moors M.D.   On: 11/28/2017 15:05      IMPRESSION/PLAN: This is a delightful patient with head and neck cancer - right tonsil, HPV+, Stage I.  We discussed the possibility of TORS surgery, understanding that adjuvant RT may still be needed.  This might, however, cure him without adjuvant RT, and be likely to help him avoid chemotherapy.  Or, he could undergo definitive RT with concurrent chemotherapy. I acknowledged that RT alone might cure him, but be less likely to cure him than ChRT or surgery +/- RT  We discussed that the need for a PEG tube would be almost definite with ChRT vs less likely with TORS +/- RT.  We discussed the potential risks, benefits,  and side effects of radiotherapy. We talked in detail about acute and late effects. We discussed that some of the most bothersome acute effects may be mucositis, dysgeusia, salivary changes, skin irritation, hair loss, dehydration, weight loss and fatigue. We talked about late effects which include but are not necessarily limited to dysphagia, hypothyroidism, nerve injury, spinal cord injury, xerostomia, trismus, and neck edema. No guarantees of treatment were given. A consent form was signed and placed in the patient's medical record. The patient is enthusiastic about proceeding with treatment. I look forward to participating in the patient's  care.    The patient is interested in a referral to discuss surgical options at [facility]. Referral made.  Simulation (treatment planning) will take place pending surgical decision.  We also discussed that the treatment of head and neck cancer is a multidisciplinary process to maximize treatment outcomes and quality of life. For this reasons the following referrals have been or will be made:  The patient is scheduled to meet with Dr. Maylon Peppers on 12/05/2017.  Medical oncology to discuss chemotherapy   The patient is scheduled to meet with Dr. Enrique Sack later today.  Dentistry for dental evaluation, possible extractions in the radiation fields, and /or advice on reducing risk of cavities, osteoradionecrosis, or other oral issues.  __________________________________________   Eppie Gibson, MD  This document serves as a record of services personally performed by Eppie Gibson, MD. It was created on her behalf by Arlyce Harman, a trained medical scribe. The creation of this record is based on the scribe's personal observations and the provider's statements to them. This document has been checked and approved by the attending provider.

## 2017-11-30 NOTE — Progress Notes (Signed)
Oncology Nurse Navigator Documentation  Met with Thomas Mccall during initial consult with Dr. Isidore Moos.  [name].   . Further introduced myself as his Navigator, explained my role as a member of the Care Team.   . Provided New Patient Information packet, discussed contents: o Contact information for physician(s), myself, other members of the Care Team. o Advance Directive information ([place] blue pamphlet with LCSW contact info); provided them [facility] ADL booklet. o Fall Prevention Patient Safety Plan o Appointment Guideline o [facility] map with highlight of [place] o SLP information sheet o Symptom Management Clinic information . Provided introductory explanation of radiation treatment including SIM planning and purpose of Aquaplast head and shoulder mask, showed them example.   . Provided and discussed education handouts for PEG and PAC.  Marland Kitchen They voiced understanding of her discussion of RT and likelihood of concurrent chemotherapy, expected SEs. . They voiced understanding TORS is a viable option for his dx of Stage I disease, agreed to referral to [facility] by Dr. Isidore Moos. . Discussed opportunity to attend [facility] to meet with other members of clinical team, indicated he will be scheduled pending decision re surgery. Escorted them to Dental Medicine following consult. I encouraged them to contact me with questions/concerns as treatments/procedures begin.  They verbalized understanding of information provided.    Gayleen Orem, RN, BSN Head & Neck Oncology Nurse [place] [facility] [place] [phone]

## 2017-11-30 NOTE — Progress Notes (Signed)
DENTAL CONSULTATION  Date of Consultation:  11/30/2017 Patient Name:   Thomas Mccall Date of Birth:   [birth year] Medical Record Number: [id number]  VITALS: BP 124/77 (BP Location: Right Arm)   Pulse 67   Temp 97.8 F (36.6 C)   CHIEF COMPLAINT: The patient was referred by Dr. Isidore Moos for a dental consultation.  HPI: Thomas Mccall is a 57 year old male recently diagnosed with squamous cell carcinoma of the right tonsillar fossa. Patient with possible TORS procedure, radiation therapy, and chemotherapy. Patient is therefore seen as part of a medically necessary pre-chemotherapy radiation therapy dental protocol examination.  The patient currently denies acute toothaches, swellings, or abscesses. Patient was seen by his primary dentist, Dr. Jetty Duhamel, for initial examination, radiographs, and scaling and root planing of the right side on 11/21/2017. Patient then had scaling and root planing of the left side of his mouth on 11/27/2017 along with an occlusal buccal resin on tooth #31. Patient was also seen by an oral surgeon, Dr. Olena Heckle, for extraction of tooth #14. Prior to that, the patient had not been seen by a dentist for '"a long time". Patient denies having partial dentures. Patient indicates that he is a dental and needle phobic.    PROBLEM LIST: Patient Active Problem List   Diagnosis Date Noted  . Malignant neoplasm of tonsillar fossa ([place]) 11/30/2017    Priority: High    PMH: Past Medical History:  Diagnosis Date  . Headache   . Mass    right tonsil  . Wears glasses     PSH: Past Surgical History:  Procedure Laterality Date  . DIRECT LARYNGOSCOPY Right 11/12/2017   Procedure: DIRECT LARYNGOSCOPY;  Surgeon: Melissa Montane, MD;  Location: [place];  Service: ENT;  Laterality: Right;  Direct laryngoscopy and biopy of right tonsil mass  . TONSILLECTOMY AND ADENOIDECTOMY  1970  . WISDOM TOOTH EXTRACTION      ALLERGIES: No Known Allergies  MEDICATIONS: Current  Outpatient Medications  Medication Sig Dispense Refill  . sodium fluoride (PREVIDENT 5000 PLUS) 1.1 % CREA dental cream Apply cream to tooth brush. Brush teeth for 2 minutes. Spit out excess. DO NOT rinse afterwards. Repeat nightly. 1 Tube prn   No current facility-administered medications for this visit.     LABS: Lab Results  Component Value Date   WBC 6.1 10/26/2017   HGB 15.4 11/12/2017   HCT 45.2 10/26/2017   MCV 85.0 10/26/2017   PLT 180.0 10/26/2017   No results found for: NA, K, CL, CO2, GLUCOSE, BUN, CREATININE, CALCIUM, GFRNONAA, GFRAA No results found for: INR, PROTIME No results found for: PTT  SOCIAL HISTORY: Social History   Socioeconomic History  . Marital status: Married    Spouse name: Not on file  . Number of children: Not on file  . Years of education: Not on file  . Highest education level: Not on file  Occupational History  . Not on file  Social Needs  . Financial resource strain: Not on file  . Food insecurity:    Worry: Not on file    Inability: Not on file  . Transportation needs:    Medical: Not on file    Non-medical: Not on file  Tobacco Use  . Smoking status: Never Smoker  . Smokeless tobacco: Never Used  Substance and Sexual Activity  . Alcohol use: Not Currently  . Drug use: Never  . Sexual activity: Yes    Partners: Female  Lifestyle  . Physical activity:    Days  per week: Not on file    Minutes per session: Not on file  . Stress: Not on file  Relationships  . Social connections:    Talks on phone: Not on file    Gets together: Not on file    Attends religious service: Not on file    Active member of club or organization: Not on file    Attends meetings of clubs or organizations: Not on file    Relationship status: Not on file  . Intimate partner violence:    Fear of current or ex partner: Not on file    Emotionally abused: Not on file    Physically abused: Not on file    Forced sexual activity: Not on file  Other Topics  Concern  . Not on file  Social History Narrative  . Not on file    FAMILY HISTORY: Family History  Problem Relation Age of Onset  . Heart attack Maternal Grandmother   . Heart attack Maternal Grandfather   . Heart attack Paternal Grandmother   . Heart attack Paternal Grandfather     REVIEW OF SYSTEMS: Reviewed with the patient as per History of present illness. Psych: Patient denies having dental phobia.  DENTAL HISTORY: CHIEF COMPLAINT: The patient was referred by Dr. Isidore Moos for a dental consultation.  HPI: Thomas Mccall is a 57 year old male recently diagnosed with squamous cell carcinoma of the right tonsillar fossa. Patient with possible TORS procedure, radiation therapy, and chemotherapy. Patient is therefore seen as part of a medically necessary pre-chemotherapy radiation therapy dental protocol examination.  The patient currently denies acute toothaches, swellings, or abscesses. Patient was seen by his primary dentist, Dr. Jetty Duhamel, for initial examination, radiographs, and scaling and root planing of the right side on 11/21/2017. Patient then had scaling and root planing of the left side of his mouth on 11/27/2017 along with an occlusal buccal resin on tooth #31. Patient was also seen by an oral surgeon, Dr. Olena Heckle, for extraction of tooth #14. Prior to that, the patient had not been seen by a dentist for '"a long time". Patient denies having partial dentures. Patient indicates that he is a dental and needle phobic.     DENTAL EXAMINATION: GENERAL:  The patient is a well-developed, well-nourished male in no acute distress. HEAD AND NECK:  Patient has right neck lymphadenopathy. There is no left neck lymphadenopathy palpated. Patient denies having acute TMJ symptoms  The patient has a maximum interincisal opening of 40 mm. INTRAORAL EXAM: patient has normal saliva. Patient has bilateral, multilobular mandibular lingual tori. The patient has maxillary left and maxillary  right buccal exostoses in the area of tooth numbers 1 through 5, and 12 through 16  There is no evidence of oral abscess formation.  There is evidence of a healing extraction site #14 that is healing in by secondary intention. DENTITION: patient is missing tooth numbers 1, 14, 16, 17, and 32. There are multiple diastemas noted. PERIODONTAL: The patient has chronic periodontitis with plaque accumulations, gingival recession,and incipient mandibular anterior tooth mobility. There is incipient to moderate bone loss noted. DENTAL CARIES/SUBOPTIMAL RESTORATIONS: no obvious dental caries are noted. ENDODONTIC:  The patient denies acute pulpitis symptoms. I do not see any evidence of periapical pathology. CROWN AND BRIDGE:  There are no crown or bridge restorations noted. PROSTHODONTIC:  There are no partial dentures. OCCLUSION:  The patient has a poor occlusal scheme secondary to missing teeth, malpositioned teeth, and lack of replacement of all missing teeth with dental  prostheses.  RADIOGRAPHIC INTERPRETATION: An orthopantogram was taken today and supplemented with 6 upper periapical radiographs. There are multiple missing teeth. Multiple diastemas are noted. Multiple malpositioned teeth are noted.there is incipient to moderate bone loss noted. Multiple amalgam restorations are noted. There are no obvious periapical radiolucencies noted. There is evidence of recent extraction the area #14.   ASSESSMENTS: 1. Squamous cell carcinoma of the right tonsillar fossa 2. Pre-chemoradiation therapy dental protocol  3. Chronic periodontitis of bone loss 4. Accretions 5. Gingival recession 6. Mandibular anterior incipient tooth mobility 7. Bilateral mandibular lingual tori 8. Bilateral maxillary buccal exostoses in the posterior premolar/molar areas 9. Multiple missing teeth 10. Multiple diastemas 11. Significant anterior overjet 12. Poor occlusal scheme and malocclusion 13. Dental phobia and needle  phobia   PLAN/RECOMMENDATIONS: 1. I discussed the risks, benefits, and complications of various treatment options with the patient and his wife in relationship to his medical and dental conditions, anticipated TORS procedure, and possible chemotherapy and radiation therapy. We discussed various treatment options to include no treatment, multiple extractions with alveoloplasty of tooth numbers 2 and 31, pre-prosthetic surgery as indicated, periodontal therapy, dental restorations, root canal therapy, crown and bridge therapy, implant therapy, and replacement of missing teeth as indicated. We also discussed fabrication of fluoride trays and scatter protection devices.The patient currently wishes to think about whether he wishes to proceed with referral to an oral surgeon for extraction of tooth numbers 2 and 31 until he has further consultation with the medical oncologist and ENT surgeon at [facility].  We also decided not to proceed with impressions today for future fabrication of fluoride trays and scatter protection devices due to the extensive exostoses and mandibular tori and the risk for significant trauma to these bony areas with impression procedures. I , therefore, prescribed PreviDent 5000 Plus to use on the toothbrush at bedtime. This prescription was sent [facility] at the request of the patient.  The patient and wife will contact dental medicine if they wish to be referred to an oral surgeon for extraction procedures.   2. Discussion of findings with medical team and coordination of future medical and dental care as needed.  I spent in excess of  120 minutes during the conduct of this consultation and >50% of this time involved direct face-to-face encounter for counseling and/or coordination of the patient's care.    Lenn Cal, DDS

## 2017-11-30 NOTE — Telephone Encounter (Signed)
Oncology Nurse Navigator Documentation  Returned call to Mr. Splawn, explained referral to [facility] was placed by Dr. Isidore Moos today, I will follow-up on Tuesday if he has not received call Monday.  He voiced understanding.  Gayleen Orem, RN, BSN Head & Neck Oncology Nurse [place] at [place] [phone]

## 2017-12-01 ENCOUNTER — Other Ambulatory Visit: Payer: Self-pay | Admitting: Hematology

## 2017-12-03 ENCOUNTER — Ambulatory Visit: Payer: Self-pay | Admitting: Hematology

## 2017-12-04 ENCOUNTER — Telehealth: Payer: Self-pay | Admitting: *Deleted

## 2017-12-04 NOTE — Telephone Encounter (Signed)
A user error has taken place: encounter opened in error, closed for administrative reasons.

## 2017-12-04 NOTE — Telephone Encounter (Signed)
Oncology Nurse Navigator Documentation  Spoke with Amy and then Helene Kelp [facility] Patient Referral, coordinated consult with Dr. Macky Lower for this Friday, 9/20 10:30.  Called patient to inform, he voiced understanding.  Later called [facility], spoke with Varney Biles, requested slides for 8/26 biopsy be sent to [facility] per Amy; arranged with Cecille Rubin [place] Secretary, to fax documentation (path report, CT and PET reports, Dr. Pearlie Oyster recent consult note) to [facility]; spoke with [facility], [facility], requested faxing of Dr. Janace Hoard' recent notes to [facility]; emailed [facility] with request for recent CT Neck and staging PET to be pushed to [facility] [employer].  Gayleen Orem, RN, BSN Head & Neck Oncology Nurse [place] at [place] [phone]

## 2017-12-05 ENCOUNTER — Inpatient Hospital Stay: Payer: BLUE CROSS/BLUE SHIELD | Attending: Hematology | Admitting: Hematology

## 2017-12-05 ENCOUNTER — Encounter: Payer: Self-pay | Admitting: Hematology

## 2017-12-05 ENCOUNTER — Telehealth: Payer: Self-pay | Admitting: Hematology

## 2017-12-05 ENCOUNTER — Encounter: Payer: Self-pay | Admitting: *Deleted

## 2017-12-05 DIAGNOSIS — C090 Malignant neoplasm of tonsillar fossa: Secondary | ICD-10-CM | POA: Insufficient documentation

## 2017-12-05 DIAGNOSIS — Z79899 Other long term (current) drug therapy: Secondary | ICD-10-CM | POA: Diagnosis not present

## 2017-12-05 DIAGNOSIS — C770 Secondary and unspecified malignant neoplasm of lymph nodes of head, face and neck: Secondary | ICD-10-CM | POA: Diagnosis not present

## 2017-12-05 NOTE — Assessment & Plan Note (Addendum)
Patient's oncologic history was reviewed extensively and collaborated with the patient. I discussed with the patient at length about the rationale for surgery versus chemoradiation.  In addition, I explained to the patient benefits and some of the common toxicities associated with radiation and chemoradiation, such as mucositis,  xerostomia, risk of infection, and dehydration. Given the patient's young age, absence of significant comorbidities, and early stage tonsillar cancer, upfront surgery followed by adjuvant radiation or chemoradiation as indicated, most likely offers the best chance of long-term disease control. Patient has an appointment scheduled with Dr. Owens Shark of ENT at [facility] on December 07, 2017.  Based on the decision, we will determine the next step (upfront surgery vs. Definitive chemoradiation).

## 2017-12-05 NOTE — Progress Notes (Signed)
Oncology Nurse Navigator Documentation  Met with patient during initial consult with Dr. Maylon Peppers. [name].  Previously met with them during 9/13 consult with Dr. Isidore Moos. They voiced understanding of his overview of chemoRT vs TORS with possibility of adjuvant RT. They reiterated preference for TORS pending further conversation with Dr. Vicie Mutters [facility] this Friday.  They agreed to call me s/p appt to provide update re tmt decision.    Gayleen Orem, RN, BSN Head & Neck Oncology Nurse [place] at [place] [phone]

## 2017-12-05 NOTE — Telephone Encounter (Signed)
No return appt to be scheduled yet. Rick (navigator) to coordinate follow-up pending surgical evaluation at [facility]. Per 9/18 los

## 2017-12-05 NOTE — Progress Notes (Signed)
[place] NOTE  Patient Care Team: Martinique, Betty G, MD as PCP - General (Family Medicine) Eppie Gibson, MD as Attending Physician (Radiation Oncology) Tish Men, MD as Consulting Physician (Hematology) Leota Sauers, RN as Oncology Nurse Navigator (Oncology)  ASSESSMENT & PLAN:  Malignant neoplasm of tonsillar fossa [facility]) Patient's oncologic history was reviewed extensively and collaborated with the patient. I discussed with the patient at length about the rationale for surgery versus chemoradiation.  In addition, I explained to the patient benefits and some of the common toxicities associated with radiation and chemoradiation, such as mucositis,  xerostomia, risk of infection, and dehydration. Given the patient's young age, absence of significant comorbidities, and early stage tonsillar cancer, upfront surgery followed by adjuvant radiation or chemoradiation as indicated, most likely offers the best chance of long-term disease control. Patient has an appointment scheduled with Dr. Owens Shark of ENT at [facility] on December 07, 2017.  Based on the decision, we will determine the next step (upfront surgery vs. Definitive chemoradiation).   A total of more than 60 minutes were spent face-to-face with the patient during this encounter and over half of that time was spent on counseling and coordination of care as outlined above.   All questions were answered. The patient knows to call the clinic with any problems, questions or concerns.  Return to clinic appt not yet scheduled, pending surgical evaluation at [facility], MD 12/05/2017 2:29 PM   CHIEF COMPLAINTS/PURPOSE OF CONSULTATION:  "I am here for my recently diagnosed tonsil cancer"  HISTORY OF PRESENTING ILLNESS:  Thomas Mccall 57 y.o. male is here because of Stage I, HPV positive tonsillar carcinoma.   Patient reports that he first noticed a scratchy throat and a small lump under his right  chin in July 2019, which he thought was due to a strep throat.  However after 2 months, the sore throat continued to persist and he also felt that the lump under the right chin had gotten slightly larger, which prompted him to seek further care.  Patient currently reports intermittent sore throat, but denies any fever, chill, weight loss, night sweats, dysphagia, or odynophagia.  He has an appointment with Dr. Owens Shark of ENT at [facility] on Friday.  I have reviewed his chart and materials related to his cancer extensively and collaborated history with the patient. Summary of oncologic history is as follows:   Malignant neoplasm of tonsillar fossa ([place])   10/30/2017 Miscellaneous    Presented to Dr. Redmond Baseman with 3 months of R neck mass    11/07/2017 Imaging    CT neck w/ contrast:  1. Right tonsillar mass measures up to 2.6 cm, highly concerning for a squamous cell carcinoma. 2. Enlarged homogeneous right level 2 lymph node measures up to 3.9 cm, consistent with metastatic disease. 3. Adjacent right level 3 lymph node measures 1.1 cm. 4. Lucency at C4 on the left is likely degenerative. Metastatic disease is not excluded. Please correlate with findings on PET scan if used for further evaluation.    11/12/2017 Procedure    R tonsillar biopsy by Dr. Janace Hoard    11/12/2017 Pathology Results    Accession: [id number] Invasive squamous cell carcinoma; p16 diffusely positive    11/28/2017 Imaging    PET scan: 1. Right tonsillar fossa mass is intensely hypermetabolic compatible with primary neoplasm. 2. Solitary enlarged and hypermetabolic right level 2 cervical lymph node compatible with metastatic adenopathy. 3. No evidence for FDG avid  hypermetabolic distant metastasis.    11/30/2017 Cancer Staging    Staging form: Pharynx - HPV-Mediated Oropharynx, AJCC 8th Edition - Clinical stage from 11/30/2017: Stage I (cT2, cN1, cM0, p16+) - Signed by Eppie Gibson, MD on 11/30/2017     MEDICAL HISTORY:   Past Medical History:  Diagnosis Date  . Headache   . Mass    right tonsil  . Wears glasses     SURGICAL HISTORY: Past Surgical History:  Procedure Laterality Date  . DIRECT LARYNGOSCOPY Right 11/12/2017   Procedure: DIRECT LARYNGOSCOPY;  Surgeon: Melissa Montane, MD;  Location: [place];  Service: ENT;  Laterality: Right;  Direct laryngoscopy and biopy of right tonsil mass  . TONSILLECTOMY AND ADENOIDECTOMY  1970  . WISDOM TOOTH EXTRACTION      SOCIAL HISTORY: Social History   Socioeconomic History  . Marital status: Married    Spouse name: Not on file  . Number of children: Not on file  . Years of education: Not on file  . Highest education level: Not on file  Occupational History  . Not on file  Social Needs  . Financial resource strain: Not on file  . Food insecurity:    Worry: Not on file    Inability: Not on file  . Transportation needs:    Medical: Not on file    Non-medical: Not on file  Tobacco Use  . Smoking status: Never Smoker  . Smokeless tobacco: Never Used  Substance and Sexual Activity  . Alcohol use: Not Currently  . Drug use: Never  . Sexual activity: Yes    Partners: Female  Lifestyle  . Physical activity:    Days per week: Not on file    Minutes per session: Not on file  . Stress: Not on file  Relationships  . Social connections:    Talks on phone: Not on file    Gets together: Not on file    Attends religious service: Not on file    Active member of club or organization: Not on file    Attends meetings of clubs or organizations: Not on file    Relationship status: Not on file  . Intimate partner violence:    Fear of current or ex partner: Not on file    Emotionally abused: Not on file    Physically abused: Not on file    Forced sexual activity: Not on file  Other Topics Concern  . Not on file  Social History Narrative  . Not on file    FAMILY HISTORY: Family History  Problem Relation Age of Onset  . Heart attack Maternal  Grandmother   . Heart attack Maternal Grandfather   . Heart attack Paternal Grandmother   . Heart attack Paternal Grandfather     ALLERGIES:  has No Known Allergies.  MEDICATIONS:  Current Outpatient Medications  Medication Sig Dispense Refill  . sodium fluoride (PREVIDENT 5000 PLUS) 1.1 % CREA dental cream Apply cream to tooth brush. Brush teeth for 2 minutes. Spit out excess. DO NOT rinse afterwards. Repeat nightly. 1 Tube prn   No current facility-administered medications for this visit.     REVIEW OF SYSTEMS:   Constitutional: ( - ) fevers, ( - )  chills , ( - ) night sweats Eyes: ( - ) blurriness of vision, ( - ) double vision, ( - ) watery eyes Respiratory: ( - ) cough, ( - ) dyspnea, ( - ) wheezes Cardiovascular: ( - ) palpitation, ( - ) chest discomfort, ( - )  lower extremity swelling Gastrointestinal:  ( - ) nausea, ( - ) heartburn, ( - ) change in bowel habits Skin: ( - ) abnormal skin rashes Neurological: ( - ) numbness, ( - ) tingling, ( - ) new weaknesses Behavioral/Psych: ( - ) mood change, ( - ) new changes  All other systems were reviewed with the patient and are negative.  PHYSICAL EXAMINATION: ECOG PERFORMANCE STATUS: 0 - Asymptomatic  Vitals:   12/05/17 1302  BP: 125/82  Pulse: 73  Resp: 18  Temp: 97.9 F (36.6 C)  SpO2: 100%   Filed Weights   12/05/17 1302  Weight: 171 lb 8 oz (77.8 kg)    GENERAL: alert, no distress and comfortable SKIN: skin color, texture, turgor are normal, no rashes or significant lesions EYES: conjunctiva are pink and non-injected, sclera clear OROPHARYNX: no exudate, no erythema; lips, buccal mucosa, and tongue normal  NECK: supple, non-tender LYMPH:  ~3x2 cm, firm, mobile Level II cervical LN in the right neck, non-tender; no other cervical or axillary lymphadenopathy LUNGS: clear to auscultation and percussion with normal breathing effort HEART: regular rate & rhythm and no murmurs and no lower extremity edema ABDOMEN:  soft, non-tender, non-distended, normal bowel sounds Musculoskeletal: no cyanosis of digits and no clubbing  PSYCH: alert & oriented x 3, fluent speech NEURO: no focal motor/sensory deficits  LABORATORY DATA:  I have reviewed the data as listed Lab Results  Component Value Date   WBC 6.1 10/26/2017   HGB 15.4 11/12/2017   HCT 45.2 10/26/2017   MCV 85.0 10/26/2017   PLT 180.0 10/26/2017   No results found for: NA, K, CL, CO2  RADIOGRAPHIC STUDIES: I have personally reviewed the radiological images as listed and agreed with the findings in the report. Ct Soft Tissue Neck W Contrast  Result Date: 11/07/2017 CLINICAL DATA:  Tonsillar mass. EXAM: CT NECK WITH CONTRAST TECHNIQUE: Multidetector CT imaging of the neck was performed using the standard protocol following the bolus administration of intravenous contrast. CONTRAST:  51mL OMNIPAQUE IOHEXOL 300 MG/ML  SOLN COMPARISON:  None. FINDINGS: Pharynx and larynx: A right tonsillar mass measures 2.2 x 1.5 x 2.6 cm. There is partial effacement of the parapharyngeal fat. Tongue base is otherwise within normal limits. The valleculae is clear. Epiglottis is normal. Hypopharynx is within normal limits. Vocal cords are midline and symmetric. Salivary glands: The submandibular and parotid glands are within normal limits bilaterally. Thyroid: Normal Lymph nodes: Enlarged homogeneous right level 2 lymph node measures 3.9 x 2.8 x 2.2 cm. Adjacent 1.1 cm node is present just below the dominant node. No significant left-sided adenopathy is present. Vascular: No significant stenosis or vascular lesion is present. Limited intracranial: Within normal limits. Visualized orbits: Globes and orbits are within normal limits. Mastoids and visualized paranasal sinuses: The paranasal sinuses and mastoid air cells are clear. Skeleton: Vertebral body heights alignment are normal. Endplate sclerotic changes are present C5-6 and C6-7. 5 mm lucent lesion subjacent to the superior  endplate of C4 on the left raises some concern for metastatic disease to the bone. Upper chest: The lung apices are clear. Focal nodule, mass, or airspace disease is present. Thoracic inlet is within normal limits. Aorta is unremarkable. IMPRESSION: 1. Right tonsillar mass measures up to 2.6 cm, highly concerning for a squamous cell carcinoma. 2. Enlarged homogeneous right level 2 lymph node measures up to 3.9 cm, consistent with metastatic disease. 3. Adjacent right level 3 lymph node measures 1.1 cm. 4. Lucency at C4 on the  left is likely degenerative. Metastatic disease is not excluded. Please correlate with findings on PET scan if used for further evaluation. Electronically Signed   By: San Morelle M.D.   On: 11/07/2017 17:00   Nm Pet Image Initial (pi) Skull Base To Thigh  Result Date: 11/28/2017 CLINICAL DATA:  Initial treatment strategy for right tonsillar fossa neoplasm. EXAM: NUCLEAR MEDICINE PET SKULL BASE TO THIGH TECHNIQUE: 8.81 mCi F-18 FDG was injected intravenously. Full-ring PET imaging was performed from the skull base to thigh after the radiotracer. CT data was obtained and used for attenuation correction and anatomic localization. Fasting blood glucose: 89 mg/dl COMPARISON:  None. FINDINGS: Mediastinal blood pool activity: SUV max 2.92 NECK: Hypermetabolic lesion within the right tonsillar fossa measures approximately 2.6 cm and has an SUV max equal to 11.03. Enlarged and hypermetabolic right level 2 lymph node measures 2.4 cm and has an SUV max of 10.14. No additional hypermetabolic cervical lymph nodes identified. Incidental CT findings: none CHEST: No hypermetabolic mediastinal or hilar nodes. No suspicious pulmonary nodules on the CT scan. No suspicious pulmonary nodules or masses identified. Incidental CT findings: none ABDOMEN/PELVIS: No abnormal hypermetabolic activity within the liver, pancreas, adrenal glands, or spleen. No hypermetabolic lymph nodes in the abdomen or pelvis.  Incidental CT findings: none SKELETON: No focal hypermetabolic activity to suggest skeletal metastasis. Incidental CT findings: none IMPRESSION: 1. Right tonsillar fossa mass is intensely hypermetabolic compatible with primary neoplasm. 2. Solitary enlarged and hypermetabolic right level 2 cervical lymph node compatible with metastatic adenopathy. 3. No evidence for FDG avid hypermetabolic distant metastasis. Electronically Signed   By: Kerby Moors M.D.   On: 11/28/2017 15:05    PATHOLOGY: I have reviewed the pathology reports as listed above in the oncologic history.

## 2017-12-07 ENCOUNTER — Telehealth: Payer: Self-pay | Admitting: *Deleted

## 2017-12-07 NOTE — Telephone Encounter (Signed)
Oncology Nurse Navigator Documentation  Received call from Mr. Mehring wife following this morning's surgical consultation with Dr. Vicie Mutters, [facility]  He has decided against surgery and wishes to move forward with chemoRT including tooth extractions per recent consult with Dr. Enrique Sack.  I indicated I would update Drs. Erasmo Downer and [place].  Gayleen Orem, RN, BSN Head & Neck Oncology Nurse [place] at [place] [phone]

## 2017-12-07 NOTE — Telephone Encounter (Signed)
Oncology Nurse Navigator Documentation  In context of pt's decision relayed this morning not to have surgery, called him to inform him Dr. Maylon Peppers wants to schedule 9/25 appt to discuss chemo, PAC/PEG. Offered several options, he stated preference for 2:30.  I indicated he will be notified of lab appt to be conducted prior to this appt.  He voiced understanding of information provided.  Gayleen Orem, RN, BSN Head & Neck Oncology Nurse [place] at [place] [phone]

## 2017-12-10 ENCOUNTER — Telehealth: Payer: Self-pay | Admitting: *Deleted

## 2017-12-10 ENCOUNTER — Other Ambulatory Visit: Payer: Self-pay | Admitting: Radiation Oncology

## 2017-12-10 ENCOUNTER — Telehealth: Payer: Self-pay | Admitting: Hematology

## 2017-12-10 ENCOUNTER — Other Ambulatory Visit: Payer: Self-pay | Admitting: Hematology

## 2017-12-10 DIAGNOSIS — C090 Malignant neoplasm of tonsillar fossa: Secondary | ICD-10-CM

## 2017-12-10 NOTE — Telephone Encounter (Signed)
Oncology Nurse Navigator Documentation  Spoke with Mr. Yanke, provided him 0800 arrival for tomorrow morning's H&N [facility], explained procedures for registration and arrival to Radiation Waiting.  He voiced understanding.  Gayleen Orem, RN, BSN Head & Neck Oncology Nurse [place] at [place] [phone]

## 2017-12-10 NOTE — Telephone Encounter (Signed)
Scheduled appt per 9/23 los - Patient is aware of apt date and time.

## 2017-12-11 ENCOUNTER — Ambulatory Visit: Payer: BLUE CROSS/BLUE SHIELD

## 2017-12-11 ENCOUNTER — Encounter: Payer: Self-pay | Admitting: *Deleted

## 2017-12-11 ENCOUNTER — Ambulatory Visit
Admission: RE | Admit: 2017-12-11 | Discharge: 2017-12-11 | Disposition: A | Discharge status: Home / Self Care | Payer: BLUE CROSS/BLUE SHIELD | Source: Ambulatory Visit | Attending: Radiation Oncology | Admitting: Radiation Oncology

## 2017-12-11 ENCOUNTER — Other Ambulatory Visit: Payer: Self-pay

## 2017-12-11 ENCOUNTER — Inpatient Hospital Stay: Payer: BLUE CROSS/BLUE SHIELD | Admitting: Nutrition

## 2017-12-11 ENCOUNTER — Ambulatory Visit: Payer: BLUE CROSS/BLUE SHIELD | Attending: Radiation Oncology | Admitting: Physical Therapy

## 2017-12-11 DIAGNOSIS — C090 Malignant neoplasm of tonsillar fossa: Secondary | ICD-10-CM | POA: Insufficient documentation

## 2017-12-11 DIAGNOSIS — R1310 Dysphagia, unspecified: Secondary | ICD-10-CM | POA: Diagnosis present

## 2017-12-11 DIAGNOSIS — Z510 Encounter for antineoplastic radiation therapy: Secondary | ICD-10-CM

## 2017-12-11 DIAGNOSIS — Z9189 Other specified personal risk factors, not elsewhere classified: Secondary | ICD-10-CM

## 2017-12-11 DIAGNOSIS — R2689 Other abnormalities of gait and mobility: Secondary | ICD-10-CM

## 2017-12-11 DIAGNOSIS — C099 Malignant neoplasm of tonsil, unspecified: Secondary | ICD-10-CM

## 2017-12-11 NOTE — Patient Instructions (Signed)
SWALLOWING EXERCISES Do these until 6 months after your last day of radiation, then 2 times per week afterwards  1. Effortful Swallows - Press your tongue against the roof of your mouth for 3 seconds, then squeeze          the muscles in your neck while you swallow your saliva or a sip of water - Repeat 15-20 times, 2-3 times a day, and use whenever you eat or drink  2. Masako Swallow - swallow with your tongue sticking out - Stick tongue out past your teeth and gently bite tongue with your teeth - Swallow, while holding your tongue with your teeth - Repeat 15-20 times, 2-3 times a day *use a wet spoon if your mouth gets dry*  3. Pitch Raise - Repeat "he", once per second in as high of a pitch as you can - Repeat 20 times, 2-3 times a day  4. Shaker Exercise - head lift - Lie flat on your back in your bed or on a couch without pillows - Raise your head and look at your feet - KEEP YOUR SHOULDERS DOWN - HOLD FOR 45-60 SECONDS, then lower your head back down - Repeat 3 times, 2-3 times a day  5. Mendelsohn Maneuver - "half swallow" exercise - Start to swallow, and keep your Adam's apple up by squeezing hard with the            muscles of the throat - Hold the squeeze for 5-7 seconds and then relax - Repeat 15 times, 2-3 times a day *use a wet spoon if your mouth gets dry*  6. Breath Hold - Say "HUH!" loudly, then hold your breath for 3 seconds at your voice box - Repeat 20 times, 2-3 times a day  7. Chin pushback - Place your fist UNDER your chin near your neck, and push back with your fist for 5 seconds - Repeat 10 times, 2-3 times a day

## 2017-12-11 NOTE — Therapy (Signed)
[place], [place], [address] Phone: [phone]   Fax:  [phone]  Physical Therapy Evaluation  Patient Details  Name: Thomas Mccall MRN: [id number] Date of Birth: 13-Feb-1961 Referring Provider: Dr. Eppie Gibson   Encounter Date: 12/11/2017  PT End of Session - 12/11/17 0958    Visit Number  1    Number of Visits  1    PT Start Time  0840    PT Stop Time  0908    PT Time Calculation (min)  28 min    Activity Tolerance  Patient tolerated treatment well    Behavior During Therapy  [facility] for tasks assessed/performed       Past Medical History:  Diagnosis Date  . Headache   . Mass    right tonsil  . Wears glasses     Past Surgical History:  Procedure Laterality Date  . DIRECT LARYNGOSCOPY Right 11/12/2017   Procedure: DIRECT LARYNGOSCOPY;  Surgeon: Melissa Montane, MD;  Location: [place];  Service: ENT;  Laterality: Right;  Direct laryngoscopy and biopy of right tonsil mass  . TONSILLECTOMY AND ADENOIDECTOMY  1970  . WISDOM TOOTH EXTRACTION      There were no vitals filed for this visit.   Subjective Assessment - 12/11/17 0913    Subjective  "It's a lot of information to take in. I figure if I'm quiet, I'll hear more of it."    Patient is accompained by:  Family member   wife, Amy   Pertinent History  Diagnosis is right tonsil squamous cell carcinoma, stage I (cT2, cN1, cM0, p16+). Had TORS consult with [facility] surgeon, but decided against that. Will have concurrent chemoRT: 7 weeks RT to tumor and bilateral neck and high dose cisplatin q 21 days.  Still pending tooth extractions and CT SIM, so RT treatment not yet scheduled.    Patient Stated Goals  get info from all clinic providers    Currently in Pain?  No/denies         [facility] PT Assessment - 12/11/17 0001      Assessment   Medical Diagnosis  stage I right tonsil squamous cell carcinoma    Referring Provider  Dr. Eppie Gibson    Onset Date/Surgical Date  11/18/17   approx.   Hand Dominance  Right    Prior Therapy  none      Precautions   Precautions  Other (comment)    Precaution Comments  active cancer currently      Restrictions   Weight Bearing Restrictions  No      Balance Screen   Has the patient fallen in the past 6 months  No    Has the patient had a decrease in activity level because of a fear of falling?   No    Is the patient reluctant to leave their home because of a fear of falling?   No      Home Environment   Living Environment  Private residence    Living Arrangements  Spouse/significant other    Type of [place]  Two level      Prior Function   Level of Independence  Independent    Vocation Requirements  works in IT   wife is Marine scientist at [employer]  no regular exercise      Cognition   Overall Cognitive Status  Within Functional Limits for tasks assessed    Attention  Focused      Observation/Other Assessments   Observations  Both patient and wife appear very stoic/flat affect      Coordination   Gross Motor Movements are Fluid and Coordinated  Yes      Functional Tests   Functional tests  Sit to Stand      Sit to Stand   Comments  11 times in 30 seconds, poor for his age      Posture/Postural Control   Posture/Postural Control  No significant limitations      ROM / Strength   AROM / PROM / Strength  AROM      AROM   Overall AROM Comments  neck and shoulder AROM all WFL; right neck mass is visible with movement of his neck      Ambulation/Gait   Ambulation/Gait  Yes    Ambulation/Gait Assistance  7: Independent        LYMPHEDEMA/ONCOLOGY QUESTIONNAIRE - 12/11/17 0921      Type   Cancer Type  right tonsil squamous cell, stage I, p16+      Treatment   Active Chemotherapy Treatment  --   will have concurrent chemorad   Active Radiation Treatment  --   will have 7 weeks; no start date  yet     Lymphedema Assessments   Lymphedema  Assessments  Head and Neck      Head and Neck   4 cm superior to sternal notch around neck  39.2 cm    6 cm superior to sternal notch around neck  38.5 cm    8 cm superior to sternal notch around neck  38.7 cm    Other  10 cm. superior to sternal notch, 40.7             Objective measurements completed on examination: See above findings.              PT Education - 12/11/17 0958    Education Details  neck ROM, posture, breathing, walking or other exercise, CURE article on staying active, "Why exercise?" flyer, PT and lymphedema info    Person(s) Educated  Patient;Spouse    Methods  Explanation;Handout    Comprehension  Verbalized understanding              Head and Neck Clinic Goals - 12/11/17 1008      Patient will be able to verbalize understanding of a home exercise program for cervical range of motion, posture, and walking.    Status  Achieved      Patient will be able to verbalize understanding of proper sitting and standing posture.    Status  Achieved      Patient will be able to verbalize understanding of lymphedema risk and availability of treatment for this condition.    Status  Achieved         Plan - 12/11/17 0959    Clinical Impression Statement  Pt. with new diagnosis of right tonsil cancer who will have chemoradiation starting sometime soon. He and his wife were both very stoic during our session today. He made reference to the large amount of information he is given at his appointments. He has good posture and neck ROM currently, with right neck mass visible when he performs active neck ROM. He scored poorly for his age on 3 second sit to stand. He does have a significant risk for lymphedema with RT planned to tumor bed and bilateral neck. He doesnot do regular exercise.  History and Personal Factors relevant to plan of care:  none    Clinical Presentation  Evolving    Clinical Presentation due to:  about to start  chemoRT    Clinical Decision Making  Low    Rehab Potential  Excellent    PT Frequency  One time visit    PT Treatment/Interventions  Patient/family education    PT Next Visit Plan  No follow-up planned, but patient is at high risk for lymphedema so will need follow-up should this develop.    PT Home Exercise Plan  neck ROM, walking    Consulted and Agree with Plan of Care  Patient       Patient will benefit from skilled therapeutic intervention in order to improve the following deficits and impairments:  Other (comment), Decreased mobility, Decreased knowledge of precautions(high risk for lymphedema)  Visit Diagnosis: Tonsil cancer ([place]) - Plan: PT plan of care cert/re-cert  At risk for lymphedema - Plan: PT plan of care cert/re-cert  Other abnormalities of gait and mobility - Plan: PT plan of care cert/re-cert     Problem List Patient Active Problem List   Diagnosis Date Noted  . Malignant neoplasm of tonsillar fossa ([place]) 11/30/2017    SALISBURY,DONNA 12/11/2017, 10:10 AM  [place], [place], [address] Phone: [phone]   Fax:  [phone]  Name: Gerhard Rappaport MRN: [id number] Date of Birth: [birth year]  Serafina Royals, PT 12/11/17 10:10 AM

## 2017-12-11 NOTE — Progress Notes (Signed)
Oncology Nurse Navigator Documentation  Met with Thomas Mccall upon his arrival for H&N [place].  [name].  Provided verbal and written overview of [place], the clinicians who will be seeing him, encouraged him to ask questions during his time with them.  He was seen by Nutrition, SLP, PT and SW.  Spoke with him at end of [facility], addressed questions.  Acknowledged their expressed interest relayed by SW G[place]a [place] to have mentors, indicated I will follow-up with them.  Indicated I will be coordinating baseline audiology for later this week/early next week.  I encouraged them to contact me with needs/concerns, noted I will be joining them tomorrow during appt with Dr. Maylon Peppers.  Gayleen Orem, RN, BSN Head & Neck Oncology [place] at [place] [phone]

## 2017-12-11 NOTE — Therapy (Signed)
[facility] [address] [place], [place], [address] Phone: [phone]   Fax:  [phone]  Speech Language Pathology Evaluation  Patient Details  Name: Thomas Mccall MRN: [id number] Date of Birth: July 07, 1960 Referring Provider: Eppie Gibson, MD   Encounter Date: 12/11/2017  End of Session - 12/11/17 1123    Visit Number  1    Number of Visits  7    Date for SLP Re-Evaluation  06/11/18    SLP Start Time  0945    SLP Stop Time   8756    SLP Time Calculation (min)  50 min    Activity Tolerance  Patient tolerated treatment well       Past Medical History:  Diagnosis Date  . Headache   . Mass    right tonsil  . Wears glasses     Past Surgical History:  Procedure Laterality Date  . DIRECT LARYNGOSCOPY Right 11/12/2017   Procedure: DIRECT LARYNGOSCOPY;  Surgeon: Melissa Montane, MD;  Location: [facility];  Service: ENT;  Laterality: Right;  Direct laryngoscopy and biopy of right tonsil mass  . TONSILLECTOMY AND ADENOIDECTOMY  1970  . WISDOM TOOTH EXTRACTION      There were no vitals filed for this visit.  Subjective Assessment - 12/11/17 0952    Subjective  Pt denies issues with swallowing currently. Irritation/discomfort with sparkling water was ID'd.     Patient is accompained by:  Family member   wife        SLP Evaluation [facility] - 12/11/17 0953      SLP Visit Information   SLP Received On  12/11/17    Referring Provider  Eppie Gibson, MD    Onset Date  June 2019    Medical Diagnosis  tonsillar CA      Pain Assessment   Currently in Pain?  No/denies      General Information   HPI  Pt with rt tonsillar SCC with initial TORS consult, Pt decided against surgical option and therefore chemo/RT will begin after pt has extractions on rt side. PEG placement is planned, as pt will have cisplatin every 3 weeks.    Behavioral/Cognition  Pt with very flat affect today. Remarked three times to tell SLP that his  cancer surprised him completely.       Prior Functional Status   Cognitive/Linguistic Baseline  Within functional limits      Cognition   Overall Cognitive Status  Within Functional Limits for tasks assessed      Auditory Comprehension   Overall Auditory Comprehension  Appears within functional limits for tasks assessed      Verbal Expression   Overall Verbal Expression  Appears within functional limits for tasks assessed      Oral Motor/Sensory Function   Overall Oral Motor/Sensory Function  Appears within functional limits for tasks assessed    Labial ROM  --    Velum  Within Functional Limits      Motor Speech   Overall Motor Speech  Appears within functional limits for tasks assessed    Phonation  Low vocal intensity        Pt currently tolerates regular diet and thin liquids. Pt with adequate dentition and cough/throat clear. POs: Pt ate [place] sandwich and drank H2O without overt s/s aspiration. Pt reported he could not chew on lt side due to recent extraction on upper molar. Thyroid elevation appeared adequate, and swallows appeared timely. Oral residue noted as minimal/WNL. Pt's swallow deemed  WNL at this time.   Because data states the risk for dysphagia during and after radiation treatment is high due to undergoing radiation and chemo tx, SLP taught pt about the possibility of reduced/limited ability for PO intake during rad tx. SLP encouraged pt to ingest POs and/or completing HEP shortly after administration of pain meds.   SLP educated pt re: changes to swallowing musculature after rad tx, and why adherence to dysphagia HEP provided today and PO consumption was necessary to inhibit muscle fibrosis following rad tx. Pt demonstrated understanding of these things to SLP.    SLP then developed a HEP for pt and pt was instructed how to perform exercises involving lingual, vocal, and pharyngeal strengthening. SLP performed each exercise and pt return demonstrated each exercise.  SLP ensured pt performance was correct prior to moving on to next exercise. Pt was instructed to complete this program 2-3 times a day,until 6 months after his last rad tx, then x2 a week after that.                SLP Education - 12/11/17 1122    Education Details  late effects head/neck radiation on swallow function, HEP procedure    Person(s) Educated  Patient;Spouse    Methods  Explanation;Demonstration;Verbal cues;Handout    Comprehension  Verbalized understanding;Need further instruction;Returned demonstration;Verbal cues required       SLP Short Term Goals - 12/11/17 1141      SLP SHORT TERM GOAL #1   Title  pt will complete HEP with rare min A over two sessions    Time  2    Period  --   visits   Status  New      SLP SHORT TERM GOAL #2   Title  pt will tell SLP why he is completing HEP     Time  2    Period  --   visits   Status  New      SLP SHORT TERM GOAL #3   Title  pt will tell SLP how a food journal can foster a quicker return to a more-normalized diet    Time  3    Period  --   visits   Status  New       SLP Long Term Goals - 12/11/17 1142      SLP LONG TERM GOAL #1   Title  pt will tell SLP 3 overt s/s of aspiration PNA with modified independence    Time  4    Period  --   visits   Status  New      SLP LONG TERM GOAL #2   Title  pt will complete HEP with modified independence over two sessions    Time  4    Period  --   visits   Status  New      SLP LONG TERM GOAL #3   Title  pt will tell SLP how to access information about life post-head/neck radiation with modified independence (classes, online info, etc)    Time  6    Period  --   visits   Status  New       Plan - 12/11/17 1123    Clinical Impression Statement  Pt appears hypersensitive to oral stimuli at this time -pt winced upon SLP touching tongue depressor to anterior tongue and lightly grazing lower incisors. Pt with oropharyngeal swallowing essentially WNL, however  the probability of swallowing difficulty increases dramatically with the initiation  of chemo and radiation therapy. Pt will need to be followed by SLP for regular assessment of accurate HEP completion as well as for safety with POs both during and following treatment/s.    Speech Therapy Frequency  --   approx once every 4 weeks   Duration  --   7 total visits   Treatment/Interventions  Aspiration precaution training;Pharyngeal strengthening exercises;Diet toleration management by SLP;Trials of upgraded texture/liquids;Internal/external aids;Patient/family education;Compensatory strategies;SLP instruction and feedback;Cueing hierarchy;Environmental controls    Potential to Achieve Goals  Good    Potential Considerations  --   anxiety(?)   SLP Home Exercise Plan  provided today    Consulted and Agree with Plan of Care  Patient       Patient will benefit from skilled therapeutic intervention in order to improve the following deficits and impairments:   Dysphagia, unspecified type    Problem List Patient Active Problem List   Diagnosis Date Noted  . Malignant neoplasm of tonsillar fossa ([facility]) 11/30/2017    Thomas Mccall ,[place], [place]  12/11/2017, 11:54 AM  [facility] [address] [place], [place], [address] Phone: [phone]   Fax:  [phone]  Name: Thomas Mccall MRN: [id number] Date of Birth: 1960/05/29

## 2017-12-11 NOTE — Progress Notes (Signed)
Patient was seen during head neck clinic.  57 year old male diagnosed with right tonsil SCC, stage I. He is a patient of Dr. Talbert Cage and Dr. Isidore Moos. He is expected to complete 7 weeks radiation therapy along with high-dose cisplatin every 21 days. Extractions and CT Sim are pending. Patient will likely receive PEG.  Past medical history is nonsignificant.  Medications are not significant.  Labs were reviewed.  Height: 5 feet 10 inches. Weight: 170 pounds. Usual body weight: 170 pounds. BMI: 24.48.  Patient presents to head neck clinic with his wife. Patient appears very anxious and speaks very softly. He verbalizes frustration that treatment affects everyone differently.  He would like to know specifically what he will experience.  Nutrition diagnosis:  Predicted suboptimal energy intake related to concurrent chemoradiation therapy for right tonsil cancer as evidenced by history or presence of a condition for which research shows increased incidence of suboptimal energy intake.  Intervention: I educated patient on the importance of smaller more frequent meals and snacks to provide adequate calories and protein. Encourage patient to attempt weight maintenance. Reviewed importance of adequate fluid intake. Recommended patient try oral nutrition supplements.  Samples and coupons were provided. Questions were answered.  Teach back method used.  Contact information provided.  Fact sheets were given.  Monitoring, evaluation, goals: Patient will tolerate adequate calories and protein to minimize weight loss throughout treatment.    Next visit: To be scheduled weekly with treatment.  **Disclaimer: This note was dictated with voice recognition software. Similar sounding words can inadvertently be transcribed and this note may contain transcription errors which may not have been corrected upon publication of note.**

## 2017-12-12 ENCOUNTER — Telehealth: Payer: Self-pay | Admitting: Hematology

## 2017-12-12 ENCOUNTER — Encounter: Payer: Self-pay | Admitting: *Deleted

## 2017-12-12 ENCOUNTER — Inpatient Hospital Stay: Payer: BLUE CROSS/BLUE SHIELD

## 2017-12-12 ENCOUNTER — Encounter: Payer: Self-pay | Admitting: Hematology

## 2017-12-12 ENCOUNTER — Inpatient Hospital Stay (HOSPITAL_BASED_OUTPATIENT_CLINIC_OR_DEPARTMENT_OTHER): Payer: BLUE CROSS/BLUE SHIELD | Admitting: Hematology

## 2017-12-12 ENCOUNTER — Telehealth: Payer: Self-pay | Admitting: *Deleted

## 2017-12-12 VITALS — BP 137/83 | HR 89 | Temp 98.1°F | Resp 18 | Ht 70.0 in | Wt 172.0 lb

## 2017-12-12 DIAGNOSIS — C090 Malignant neoplasm of tonsillar fossa: Secondary | ICD-10-CM | POA: Diagnosis not present

## 2017-12-12 DIAGNOSIS — C770 Secondary and unspecified malignant neoplasm of lymph nodes of head, face and neck: Secondary | ICD-10-CM | POA: Diagnosis not present

## 2017-12-12 DIAGNOSIS — Z79899 Other long term (current) drug therapy: Secondary | ICD-10-CM

## 2017-12-12 LAB — CMP (CANCER CENTER ONLY)
ALT: 20 U/L (ref 0–44)
AST: 13 U/L — ABNORMAL LOW (ref 15–41)
Albumin: 4.2 g/dL (ref 3.5–5.0)
Alkaline Phosphatase: 75 U/L (ref 38–126)
Anion gap: 8 (ref 5–15)
BUN: 11 mg/dL (ref 6–20)
CO2: 28 mmol/L (ref 22–32)
Calcium: 9.7 mg/dL (ref 8.9–10.3)
Chloride: 106 mmol/L (ref 98–111)
Creatinine: 1.17 mg/dL (ref 0.61–1.24)
GFR, Est AFR Am: >60 mL/min (ref >60)
GFR, Estimated: >60 mL/min (ref >60)
Glucose, Bld: 97 mg/dL (ref 70–99)
Potassium: 3.9 mmol/L (ref 3.5–5.1)
Sodium: 142 mmol/L (ref 135–145)
Total Bilirubin: 0.5 mg/dL (ref 0.3–1.2)
Total Protein: 7.3 g/dL (ref 6.5–8.1)

## 2017-12-12 LAB — CBC WITH DIFFERENTIAL (CANCER CENTER ONLY)
Basophils Absolute: 0 10*3/uL (ref 0.0–0.1)
Basophils Relative: 1 %
Eosinophils Absolute: 0 10*3/uL (ref 0.0–0.5)
Eosinophils Relative: 1 %
HCT: 43.8 % (ref 38.4–49.9)
Hemoglobin: 14.4 g/dL (ref 13.0–17.1)
Lymphocytes Relative: 17 %
Lymphs Abs: 0.6 10*3/uL — ABNORMAL LOW (ref 0.9–3.3)
MCH: 27.8 pg (ref 27.2–33.4)
MCHC: 32.8 g/dL (ref 32.0–36.0)
MCV: 84.9 fL (ref 79.3–98.0)
Monocytes Absolute: 0.6 10*3/uL (ref 0.1–0.9)
Monocytes Relative: 15 %
Neutro Abs: 2.5 10*3/uL (ref 1.5–6.5)
Neutrophils Relative %: 66 %
Platelet Count: 157 10*3/uL (ref 140–400)
RBC: 5.16 MIL/uL (ref 4.20–5.82)
RDW: 13.8 % (ref 11.0–14.6)
WBC Count: 3.8 10*3/uL — ABNORMAL LOW (ref 4.0–10.3)

## 2017-12-12 LAB — MAGNESIUM: Magnesium: 2.2 mg/dL (ref 1.7–2.4)

## 2017-12-12 MED ORDER — ONDANSETRON HCL 8 MG PO TABS: 8.0000 mg | ORAL_TABLET | Freq: Two times a day (BID) | ORAL | 1 refills | Status: DC | PRN

## 2017-12-12 MED ORDER — LORAZEPAM 0.5 MG PO TABS: 0.5000 mg | ORAL_TABLET | Freq: Four times a day (QID) | ORAL | 0 refills | Status: DC | PRN

## 2017-12-12 MED ORDER — LIDOCAINE-PRILOCAINE 2.5-2.5 % EX CREA: TOPICAL_CREAM | CUTANEOUS | 3 refills | Status: DC

## 2017-12-12 MED ORDER — DEXAMETHASONE 4 MG PO TABS: ORAL_TABLET | ORAL | 1 refills | Status: DC

## 2017-12-12 MED ORDER — MORPHINE SULFATE (CONCENTRATE) 10 MG /0.5 ML PO SOLN: 10.0000 mg | ORAL | 0 refills | Status: DC | PRN

## 2017-12-12 MED ORDER — PROCHLORPERAZINE MALEATE 10 MG PO TABS: 10.0000 mg | ORAL_TABLET | Freq: Four times a day (QID) | ORAL | 1 refills | Status: DC | PRN

## 2017-12-12 MED FILL — PROCHLORPERAZINE 10 MG TAB: 10 | 8 days supply | Qty: 30 | Fill #0

## 2017-12-12 MED FILL — DEXAMETHASONE 4 MG TABLET: 4 | 22 days supply | Qty: 30 | Fill #0

## 2017-12-12 MED FILL — LORazepam 0.5 MG TABS: 0.5 | 8 days supply | Qty: 30 | Fill #0

## 2017-12-12 MED FILL — LIDOCAINE-PRILOCAINE CREAM: 2.5-2.5 | 7 days supply | Qty: 30 | Fill #0

## 2017-12-12 MED FILL — ONDANSETRON HCL 8 MG TABLET: 8 | 15 days supply | Qty: 30 | Fill #0

## 2017-12-12 NOTE — Telephone Encounter (Signed)
Oncology Nurse Navigator Documentation  Spoke with [facility], requested patient be contacted for baseline audiology appt within in the next week.  She voiced understanding, agreed to call me when appt established.  Gayleen Orem, RN, BSN Head & Neck Oncology Nurse [place] [facility] [phone]

## 2017-12-12 NOTE — Telephone Encounter (Signed)
Gave pt avs and calendar  °

## 2017-12-12 NOTE — Progress Notes (Signed)
[name] OFFICE PROGRESS NOTE  Patient Care Team: Martinique, Betty G, MD as PCP - General (Family Medicine) Eppie Gibson, MD as Attending Physician (Radiation Oncology) Tish Men, MD as Consulting Physician (Hematology) Leota Sauers, RN as Oncology Nurse Navigator (Oncology)  ASSESSMENT & PLAN:  Malignant neoplasm of tonsillar fossa [name]) The patient was evaluated by ENT at [facility] last week.  Due to size of the tumor, he will require extensive resection of the soft palate and nasopharyngeal wall, which would cause significant functional impact.  Therefore, ENT recommended against surgical resection and to proceed with concurrent chemoradiation.  We discussed some of the risks, benefits, side-effects of cisplatin. The intent is for cure.  Some of the short term side-effects included, though not limited to, including weight loss, life threatening infections, risk of allergic reactions, need for transfusions of blood products, nausea, vomiting, change in bowel habits, loss of hair, admission to hospital for various reasons, and risks of death.   Long term side-effects are also discussed including risks of infertility, permanent damage to nerve function, hearing loss, chronic fatigue, kidney damage with possibility needing hemodialysis, and rare secondary malignancy including bone marrow disorders.  The patient is aware that the response rates discussed earlier is not guaranteed.  After a long discussion, patient made an informed decision to proceed with the prescribed plan of care and went ahead to sign the consent form today.   Patient education material was dispensed.  Treatment decision is based on NCCN guidelines, 100 mg/m2 every 3 weeks for total of 3 cycles with concurrent radiation treatment. References as follows:  Marykay Lex GL, et al. An intergroup phase III comparison of standard radiation therapy and two schedules of concurrent  chemoradiotherapy in patients with unresectable squamous cell head and neck cancer. J. Clin. Oncol. 2003;21(1):92-98.  PURPOSE:  The Head and Neck Intergroup conducted a phase III randomized trial to test the benefit of adding chemotherapy to radiation in patients with unresectable squamous cell head and neck cancer. PATIENTS AND METHODS:  Eligible patients were randomly assigned between arm A (the control), single daily fractionated radiation (70 Gy at 2 Gy/d); arm B, identical radiation therapy with concurrent bolus cisplatin, given on days 1, 22, and 43; and arm C, a split course of single daily fractionated radiation and three cycles of concurrent infusional fluorouracil and bolus cisplatin chemotherapy, 30 Gy given with the first cycle and 30 to 40 Gy given with the third cycle. Surgical resection was encouraged if possible after the second chemotherapy cycle on arm C and, if necessary, as salvage therapy on all three treatment arms. Survival data were compared between each experimental arm and the control arm using a one-sided log-rank test. RESULTS:  Between 1992 and 1999, 295 patients were entered on this trial. This did not meet the accrual goal of 362 patients and resulted in premature study closure. Grade 3 or worse toxicity occurred in 52% of patients enrolled in arm A, compared with 89% enrolled in arm B (P <.0001) and 77% enrolled in arm C (P <.001). With a median follow-up of 41 months, the 3-year projected overall survival for patients enrolled in arm A is 23%, compared with 37% for arm B (P =.014) and 27% for arm C (P = not significant). CONCLUSION:  The addition of concurrent high-dose, single-agent cisplatin to conventional single daily fractionated radiation significantly improves survival, although it also increases toxicity. The loss of efficacy resulting from split-course radiation was not offset by  either multiagent chemotherapy or the possibility of midcourse surgery.  The patient  is scheduled to meet with OMFS for dental extraction.  Once he completes dental procedure, then he will proceed with port and feeding tube placement.  We will coordinate the start date of chemotherapy with radiation once he is cleared from the dental standpoint.  We will tentatively schedule chemotherapy 2 weeks from today (ie. 12/28/2017). The patient is scheduled for CT simulation next week.   Pain Control I have sent oral morphine concentrate, 10mg /0.63mL q3hrs PRN x 30 days, to the pt's pharmacy in anticipation of treatment-related mucositis.   Anti-emetics I have sent PRN Zofran, Compazine, Ativan and dexamethasone to the patient's pharmacy in anticipation of chemotherapy-associated nausea and emesis.  Vaccination We discussed the importance of preventive care and reviewed the vaccination programs. He does not have any prior allergic reactions to influenza vaccination. He agrees to proceed with influenza vaccination and will receive it at a local [facility] or [facility].   Orders Placed This Encounter  Procedures  . CBC with Differential (Cancer Center Only)    Standing Status:   Standing    Number of Occurrences:   20    Standing Expiration Date:   12/13/2018  . Basic Metabolic Panel - [place] Only    Standing Status:   Standing    Number of Occurrences:   20    Standing Expiration Date:   12/13/2018  . PHYSICIAN COMMUNICATION ORDER    A baseline Audiogram is recommended prior to initiation of cisplatin chemotherapy.    All questions were answered. The patient knows to call the clinic with any problems, questions or concerns. No barriers to learning was detected.  A total of more than 25 minutes were spent face-to-face with the patient during this encounter and over half of that time was spent on counseling and coordination of care as outlined above.   Return to clinic tentatively on 12/27/2017 (chemotherapy tentatively scheduled on 12/28/2017).   Tish Men, MD 12/12/2017 3:56  PM  CHIEF COMPLAINT: "I am here to discuss chemotherapy"  INTERVAL HISTORY: Thomas Mccall returns to clinic to discuss chemotherapy in preparation for concurrent chemoradiation for his tonsillar cancer.  [name] today.  Patient reports that he has mild sore throat and has been feeling a bit overwhelmed after meeting with multiple providers.  He denies any fever, chills, weight loss, night sweats, worsening or new cervical adenopathy, chest pain or dyspnea.   Patient and his wife had questions regarding the potential side effects of chemotherapy and radiation, including mucositis, dehydration, and dysphagia.  We also discussed the role of port and feeding tube, and the various forms of enteral feeding (bolus versus continuous).  We also briefly discussed the role of surveillance imaging and follow-up post chemoradiation.   SUMMARY OF ONCOLOGIC HISTORY:   Malignant neoplasm of tonsillar fossa ([place])   10/30/2017 Miscellaneous    Presented to Dr. Redmond Baseman with 3 months of R neck mass    11/07/2017 Imaging    CT neck w/ contrast:  1. Right tonsillar mass measures up to 2.6 cm, highly concerning for a squamous cell carcinoma. 2. Enlarged homogeneous right level 2 lymph node measures up to 3.9 cm, consistent with metastatic disease. 3. Adjacent right level 3 lymph node measures 1.1 cm. 4. Lucency at C4 on the left is likely degenerative. Metastatic disease is not excluded. Please correlate with findings on PET scan if used for further evaluation.    11/12/2017 Procedure  R tonsillar biopsy by Dr. Janace Hoard    11/12/2017 Pathology Results    Accession: [id number] Invasive squamous cell carcinoma; p16 diffusely positive    11/28/2017 Imaging    PET scan: 1. Right tonsillar fossa mass is intensely hypermetabolic compatible with primary neoplasm. 2. Solitary enlarged and hypermetabolic right level 2 cervical lymph node compatible with metastatic adenopathy. 3. No evidence for FDG  avid hypermetabolic distant metastasis.    11/30/2017 Cancer Staging    Staging form: Pharynx - HPV-Mediated Oropharynx, AJCC 8th Edition - Clinical stage from 11/30/2017: Stage I (cT2, cN1, cM0, p16+) - Signed by Eppie Gibson, MD on 11/30/2017    12/07/2017 Miscellaneous    ENT evaluation to [facility] recommended against surgical resection due to the tumor extending into the right inferior nasopharynx and posterior soft palate, which would require resection of almost 1/2 of the soft palate and the right nasopharyngeal wall extending into right posterior/lateral posterior wall.    12/12/2017 -  Chemotherapy    The patient had palonosetron (ALOXI) injection 0.25 mg, 0.25 mg, Intravenous,  Once, 0 of 3 cycles CISplatin (PLATINOL) 196 mg in sodium chloride 0.9 % 500 mL chemo infusion, 100 mg/m2 = 196 mg, Intravenous,  Once, 0 of 3 cycles fosaprepitant (EMEND) 150 mg, dexamethasone (DECADRON) 12 mg in sodium chloride 0.9 % 145 mL IVPB, , Intravenous,  Once, 0 of 3 cycles  for chemotherapy treatment.      REVIEW OF SYSTEMS:   Constitutional: ( - ) fevers, ( - )  chills , ( - ) night sweats Eyes: ( - ) blurriness of vision, ( - ) double vision, ( - ) watery eyes Ears, nose, mouth, throat, and face: ( - ) mucositis, ( + ) sore throat Respiratory: ( - ) cough, ( - ) dyspnea, ( - ) wheezes Cardiovascular: ( - ) palpitation, ( - ) chest discomfort, ( - ) lower extremity swelling Gastrointestinal:  ( - ) nausea, ( - ) heartburn, ( - ) change in bowel habits Skin: ( - ) abnormal skin rashes Lymphatics: ( - ) new lymphadenopathy, ( - ) easy bruising Neurological: ( - ) numbness, ( - ) tingling, ( - ) new weaknesses Behavioral/Psych: ( - ) mood change, ( - ) new changes  All other systems were reviewed with the patient and are negative.  I have reviewed the past medical history, past surgical history, social history and family history with the patient and they are unchanged from previous  note.  ALLERGIES:  has No Known Allergies.  MEDICATIONS:  Current Outpatient Medications  Medication Sig Dispense Refill  . dexamethasone (DECADRON) 4 MG tablet Take 2 tablets by mouth once a day on the day after chemotherapy and then take 2 tablets two times a day for 2 days. Take with food. 30 tablet 1  . lidocaine-prilocaine (EMLA) cream Apply to affected area once 30 g 3  . LORazepam (ATIVAN) 0.5 MG tablet Take 1 tablet (0.5 mg total) by mouth every 6 (six) hours as needed (Nausea or vomiting). 30 tablet 0  . [START ON 12/27/2017] Morphine Sulfate (MORPHINE CONCENTRATE) 10 mg / 0.5 ml concentrated solution Take 0.5 mLs (10 mg total) by mouth every 3 (three) hours as needed for severe pain. 120 mL 0  . ondansetron (ZOFRAN) 8 MG tablet Take 1 tablet (8 mg total) by mouth 2 (two) times daily as needed. Start on the third day after chemotherapy. 30 tablet 1  . prochlorperazine (COMPAZINE) 10 MG tablet Take 1 tablet (  10 mg total) by mouth every 6 (six) hours as needed (Nausea or vomiting). 30 tablet 1  . sodium fluoride (PREVIDENT 5000 PLUS) 1.1 % CREA dental cream Apply cream to tooth brush. Brush teeth for 2 minutes. Spit out excess. DO NOT rinse afterwards. Repeat nightly. 1 Tube prn   No current facility-administered medications for this visit.     PHYSICAL EXAMINATION: ECOG PERFORMANCE STATUS: 0 - Asymptomatic  Vitals:   12/12/17 1436  BP: 137/83  Pulse: 89  Resp: 18  Temp: 98.1 F (36.7 C)  SpO2: 97%   Filed Weights   12/12/17 1436  Weight: 172 lb (78 kg)    GENERAL: alert, no distress and comfortable SKIN: skin color, texture, turgor are normal, no rashes or significant lesions EYES: conjunctiva are pink and non-injected, sclera clear OROPHARYNX: no exudate, no erythema; unable to visualize the posterior tonsil NECK: supple, a 3 x 3cm mobile, firm, mildly tender R level II LN palpated  LYMPH:  no palpable lymphadenopathy in the cervical or axillary region  LUNGS: clear  to auscultation and percussion with normal breathing effort HEART: regular rate & rhythm and no murmurs and no lower extremity edema ABDOMEN: soft, non-tender, non-distended, normal bowel sounds Musculoskeletal: no cyanosis of digits and no clubbing  PSYCH: alert & oriented x 3, fluent speech NEURO: no focal motor/sensory deficits  LABORATORY DATA:  I have reviewed the data as listed    Component Value Date/Time   NA 142 12/12/2017 1426   K 3.9 12/12/2017 1426   CL 106 12/12/2017 1426   CO2 28 12/12/2017 1426   GLUCOSE 97 12/12/2017 1426   BUN 11 12/12/2017 1426   CREATININE 1.17 12/12/2017 1426   CALCIUM 9.7 12/12/2017 1426   PROT 7.3 12/12/2017 1426   ALBUMIN 4.2 12/12/2017 1426   AST 13 (L) 12/12/2017 1426   ALT 20 12/12/2017 1426   ALKPHOS 75 12/12/2017 1426   BILITOT 0.5 12/12/2017 1426   GFRNONAA >60 12/12/2017 1426   GFRAA >60 12/12/2017 1426    No results found for: SPEP, UPEP  Lab Results  Component Value Date   WBC 3.8 (L) 12/12/2017   NEUTROABS 2.5 12/12/2017   HGB 14.4 12/12/2017   HCT 43.8 12/12/2017   MCV 84.9 12/12/2017   PLT 157 12/12/2017      Chemistry      Component Value Date/Time   NA 142 12/12/2017 1426   K 3.9 12/12/2017 1426   CL 106 12/12/2017 1426   CO2 28 12/12/2017 1426   BUN 11 12/12/2017 1426   CREATININE 1.17 12/12/2017 1426      Component Value Date/Time   CALCIUM 9.7 12/12/2017 1426   ALKPHOS 75 12/12/2017 1426   AST 13 (L) 12/12/2017 1426   ALT 20 12/12/2017 1426   BILITOT 0.5 12/12/2017 1426       RADIOGRAPHIC STUDIES: I have personally reviewed the radiological images as listed below and agreed with the findings in the report. Nm Pet Image Initial (pi) Skull Base To Thigh  Result Date: 11/28/2017 CLINICAL DATA:  Initial treatment strategy for right tonsillar fossa neoplasm. EXAM: NUCLEAR MEDICINE PET SKULL BASE TO THIGH TECHNIQUE: 8.81 mCi F-18 FDG was injected intravenously. Full-ring PET imaging was performed from  the skull base to thigh after the radiotracer. CT data was obtained and used for attenuation correction and anatomic localization. Fasting blood glucose: 89 mg/dl COMPARISON:  None. FINDINGS: Mediastinal blood pool activity: SUV max 2.92 NECK: Hypermetabolic lesion within the right tonsillar fossa measures approximately  2.6 cm and has an SUV max equal to 11.03. Enlarged and hypermetabolic right level 2 lymph node measures 2.4 cm and has an SUV max of 10.14. No additional hypermetabolic cervical lymph nodes identified. Incidental CT findings: none CHEST: No hypermetabolic mediastinal or hilar nodes. No suspicious pulmonary nodules on the CT scan. No suspicious pulmonary nodules or masses identified. Incidental CT findings: none ABDOMEN/PELVIS: No abnormal hypermetabolic activity within the liver, pancreas, adrenal glands, or spleen. No hypermetabolic lymph nodes in the abdomen or pelvis. Incidental CT findings: none SKELETON: No focal hypermetabolic activity to suggest skeletal metastasis. Incidental CT findings: none IMPRESSION: 1. Right tonsillar fossa mass is intensely hypermetabolic compatible with primary neoplasm. 2. Solitary enlarged and hypermetabolic right level 2 cervical lymph node compatible with metastatic adenopathy. 3. No evidence for FDG avid hypermetabolic distant metastasis. Electronically Signed   By: Kerby Moors M.D.   On: 11/28/2017 15:05

## 2017-12-12 NOTE — Progress Notes (Deleted)
START ON PATHWAY REGIMEN - Head and Neck     A cycle is every 21 days:     Cisplatin   **Always confirm dose/schedule in your pharmacy ordering system**  Patient Characteristics: Oropharynx, HPV Positive, Clinically Staged, T0-4, cN1-3 or T3-4, cN0 Disease Classification: Oropharynx HPV Status: Positive (+) AJCC N Category: cN1 AJCC 8 Stage Grouping: I Current Disease Status: No Distant Metastases and No Recurrent Disease AJCC T Category: T2 AJCC M Category: M0 Intent of Therapy: Curative Intent, Discussed with Patient

## 2017-12-12 NOTE — Progress Notes (Signed)
Oncology Nurse Navigator Documentation  To provide support, encouragement and care continuity, met with Mr. Chavira and his wife during beginning/end of follow-up consult with Dr. Maylon Peppers. Per Dr. Maylon Peppers, plan is for HD cisplatin q21d. Mr. Lightner:  Indicated baseline audiology scheduled for this Friday 1:00 with [facility] ENT Audiology.  Voiced understanding PEG/PAC placement will be scheduled approximately 1 wk s/p dental extractions possibly to be done tomorrow with Dr. Lorenza Chick, Oral surgeon. Received his FMLA/ST Disability paperwork from wife. I encouraged them to call me with questions/concerns.   Navigator Interventions Spoke with Ivin Poot, HIM, informed her of FMLA/STD forms I'm delivering to [place] for her pick-up tomorrow.  Gayleen Orem, RN, BSN Head & Neck Oncology Nurse [place] at [place] [phone]

## 2017-12-12 NOTE — Assessment & Plan Note (Addendum)
The patient was evaluated by ENT at [facility] last week.  Due to size of the tumor, he will require extensive resection of the soft palate and nasopharyngeal wall, which would cause significant functional impact.  Therefore, ENT recommended against surgical resection and to proceed with concurrent chemoradiation.  We discussed some of the risks, benefits, side-effects of cisplatin. The intent is for cure.  Some of the short term side-effects included, though not limited to, including weight loss, life threatening infections, risk of allergic reactions, need for transfusions of blood products, nausea, vomiting, change in bowel habits, loss of hair, admission to hospital for various reasons, and risks of death.   Long term side-effects are also discussed including risks of infertility, permanent damage to nerve function, hearing loss, chronic fatigue, kidney damage with possibility needing hemodialysis, and rare secondary malignancy including bone marrow disorders.  The patient is aware that the response rates discussed earlier is not guaranteed.  After a long discussion, patient made an informed decision to proceed with the prescribed plan of care and went ahead to sign the consent form today.   Patient education material was dispensed.  Treatment decision is based on NCCN guidelines, 100 mg/m2 every 3 weeks for total of 3 cycles with concurrent radiation treatment. References as follows:  Marykay Lex GL, et al. An intergroup phase III comparison of standard radiation therapy and two schedules of concurrent chemoradiotherapy in patients with unresectable squamous cell head and neck cancer. J. Clin. Oncol. 2003;21(1):92-98.  PURPOSE:  The Head and Neck Intergroup conducted a phase III randomized trial to test the benefit of adding chemotherapy to radiation in patients with unresectable squamous cell head and neck cancer. PATIENTS AND METHODS:  Eligible patients were randomly assigned  between arm A (the control), single daily fractionated radiation (70 Gy at 2 Gy/d); arm B, identical radiation therapy with concurrent bolus cisplatin, given on days 1, 22, and 43; and arm C, a split course of single daily fractionated radiation and three cycles of concurrent infusional fluorouracil and bolus cisplatin chemotherapy, 30 Gy given with the first cycle and 30 to 40 Gy given with the third cycle. Surgical resection was encouraged if possible after the second chemotherapy cycle on arm C and, if necessary, as salvage therapy on all three treatment arms. Survival data were compared between each experimental arm and the control arm using a one-sided log-rank test. RESULTS:  Between 1992 and 1999, 295 patients were entered on this trial. This did not meet the accrual goal of 362 patients and resulted in premature study closure. Grade 3 or worse toxicity occurred in 52% of patients enrolled in arm A, compared with 89% enrolled in arm B (P <.0001) and 77% enrolled in arm C (P <.001). With a median follow-up of 41 months, the 3-year projected overall survival for patients enrolled in arm A is 23%, compared with 37% for arm B (P =.014) and 27% for arm C (P = not significant). CONCLUSION:  The addition of concurrent high-dose, single-agent cisplatin to conventional single daily fractionated radiation significantly improves survival, although it also increases toxicity. The loss of efficacy resulting from split-course radiation was not offset by either multiagent chemotherapy or the possibility of midcourse surgery.  The patient is scheduled to meet with OMFS for dental extraction.  Once he completes dental procedure, then he will proceed with port and feeding tube placement.  We will coordinate the start date of chemotherapy with radiation once he is cleared from the dental standpoint.  We will tentatively schedule chemotherapy 2 weeks from today (ie. 12/28/2017). The patient is scheduled for CT simulation  next week.

## 2017-12-13 ENCOUNTER — Telehealth: Payer: Self-pay | Admitting: *Deleted

## 2017-12-13 ENCOUNTER — Other Ambulatory Visit: Payer: Self-pay | Admitting: Hematology

## 2017-12-13 DIAGNOSIS — C090 Malignant neoplasm of tonsillar fossa: Secondary | ICD-10-CM

## 2017-12-13 LAB — THYROID PANEL WITH TSH
Free Thyroxine Index: 2.1 (ref 1.2–4.9)
T3 Uptake Ratio: 26 % (ref 24–39)
T4, Total: 8.1 ug/dL (ref 4.5–12.0)
TSH: 0.55 u[IU]/mL (ref 0.450–4.500)

## 2017-12-13 NOTE — Telephone Encounter (Signed)
Received call from [place] @ Dr Lebron Quam DDS where patient received 2 dental extractions today.  Dr Marylene Land states that patient needs to wait atleast 10-14 days prior to portacath placement or feeding tube placement.    Message relayed to Dr Maylon Peppers and Head and neck nagivator.

## 2017-12-13 NOTE — Telephone Encounter (Signed)
Correction to previous note: per Dr Olena Heckle must weight 7-14 days prior to Chemo radiation. Ok to proceed with portacath placement and feeding tube.

## 2017-12-13 NOTE — Telephone Encounter (Signed)
I spoke with Dr. Olena Heckle. He said it's okay to place port and PEG but he wants to wait at least 10 to 14 days before treatment. It was a miscommunication from his staff re: port and PEG.   Thanks.

## 2017-12-13 NOTE — Telephone Encounter (Signed)
Oncology Nurse Navigator Documentation  Received call from patient's wife indicating he had 2 teeth extracted today with Dr. Olena Heckle, his office to call Dr. Maylon Peppers with guidance OK to place [facility] and PEG in 10-14 days.  Dr. Isidore Moos informed.  Gayleen Orem, RN, BSN Head & Neck Oncology Nurse [place] at [place] [phone]

## 2017-12-14 ENCOUNTER — Telehealth: Payer: Self-pay | Admitting: *Deleted

## 2017-12-14 NOTE — Progress Notes (Signed)
Has armband been applied?  Yes  Does patient have an allergy to IV contrast dye?: No   Has patient ever received premedication for IV contrast dye?: N/A  Does patient take metformin?: No  If patient does take metformin when was the last dose: N/A  Date of lab work: 12/12/17 BUN: 11 CR: 1.17 EGFR: >60  IV site: right AC  Has IV site been added to flowsheet?  Yes

## 2017-12-14 NOTE — Telephone Encounter (Signed)
Yes, I just confirmed that he has chemo appt on 10/11, and see me on 10/10 and for chemo ed.   Thanks.   Eulas Post

## 2017-12-14 NOTE — Telephone Encounter (Signed)
Oncology Nurse Navigator Documentation  Called patient to inform him of CT SIM appt on Monday, 1:45 IV Start, 2:30 SIM.  He voiced understanding.  Gayleen Orem, RN, BSN Head & Neck Oncology Nurse [place] at [place] [phone]

## 2017-12-17 ENCOUNTER — Ambulatory Visit
Admission: RE | Admit: 2017-12-17 | Discharge: 2017-12-17 | Disposition: A | Discharge status: Home / Self Care | Payer: BLUE CROSS/BLUE SHIELD | Source: Ambulatory Visit | Attending: Radiation Oncology | Admitting: Radiation Oncology

## 2017-12-17 ENCOUNTER — Telehealth (HOSPITAL_COMMUNITY): Payer: Self-pay | Admitting: Dentistry

## 2017-12-17 ENCOUNTER — Encounter: Payer: Self-pay | Admitting: *Deleted

## 2017-12-17 ENCOUNTER — Telehealth: Payer: Self-pay | Admitting: *Deleted

## 2017-12-17 ENCOUNTER — Encounter: Payer: Self-pay | Admitting: Hematology

## 2017-12-17 VITALS — BP 128/83 | HR 69 | Temp 97.6°F

## 2017-12-17 DIAGNOSIS — R1310 Dysphagia, unspecified: Secondary | ICD-10-CM | POA: Diagnosis not present

## 2017-12-17 DIAGNOSIS — C090 Malignant neoplasm of tonsillar fossa: Secondary | ICD-10-CM

## 2017-12-17 DIAGNOSIS — C099 Malignant neoplasm of tonsil, unspecified: Secondary | ICD-10-CM | POA: Diagnosis not present

## 2017-12-17 DIAGNOSIS — R2689 Other abnormalities of gait and mobility: Secondary | ICD-10-CM | POA: Diagnosis not present

## 2017-12-17 DIAGNOSIS — Z9189 Other specified personal risk factors, not elsewhere classified: Secondary | ICD-10-CM | POA: Diagnosis not present

## 2017-12-17 MED ORDER — SODIUM CHLORIDE 0.9% FLUSH
10.0000 mL | Freq: Once | INTRAVENOUS | Status: AC
  Administered 2017-12-17: 10 mL via INTRAVENOUS

## 2017-12-17 NOTE — Progress Notes (Signed)
Disability successfully faxed to [facility] [employer] at [phone], as well as to [place] at [phone]. Mailed copies to patient address on file.

## 2017-12-17 NOTE — Telephone Encounter (Signed)
Oncology Nurse Navigator Documentation  Spoke with Thomas Mccall, confirmed his understanding of 10/9 PAC/PEG placement with 1:30 arrival to [facility] Radiology. He agreed to [facility] education following this afternoon's CT SIM.  Gayleen Orem, RN, BSN Head & Neck Oncology Nurse [place] at [place] [phone]

## 2017-12-17 NOTE — Telephone Encounter (Signed)
12/17/2017  Patient:            Thomas Mccall Date of Birth:  1960-10-20 MRN:                [id number]    I called and had a discussion with Dr. Sula Soda (oral surgery)office. Patient was seen on 9/26 and had tooth numbers 2 and 31 extracted. Patient is not scheduled for follow-up appointment at this time. Patient is to proceed with simulation appointment with Dr. Isidore Moos. Dr. Isidore Moos to contact Dr. Olena Heckle at [phone] to see when patient is cleared for start of radiation therapy.   Lenn Cal, DDS

## 2017-12-18 ENCOUNTER — Telehealth: Payer: Self-pay | Admitting: *Deleted

## 2017-12-18 ENCOUNTER — Encounter: Payer: Self-pay | Admitting: *Deleted

## 2017-12-18 ENCOUNTER — Other Ambulatory Visit: Payer: Self-pay | Admitting: Radiation Oncology

## 2017-12-18 NOTE — Progress Notes (Signed)
Oncology Nurse Navigator Documentation  To provide support, encouragement and care continuity, met with Thomas Mccall during his CT SIM. [name].   He tolerated procedure without difficulty, denied questions/concerns.   I toured them to [facility] 2 treatment area, explained procedures for lobby registration, arrival to Radiation Waiting, arrival to tmt area and preparation for tmt.  They voiced understanding.   We proceeded to [place] for PEG education.  Gayleen Orem, RN, BSN Head & Neck Oncology Nurse [place] at [place] [phone]

## 2017-12-18 NOTE — Progress Notes (Signed)
Oncology Nurse Navigator Documentation  [place] forms with copy of pathology report to [facility] for processing by [facility] HIM.  Gayleen Orem, RN, BSN Head & Neck Oncology Nurse [place] at [place] [phone]

## 2017-12-18 NOTE — Progress Notes (Addendum)
Oncology Nurse Navigator Documentation  Mr. Hakim and his wife arrived s/p CT SIM for PEG education.  Using  PEG teaching device   and Teach Back, provided education for PEG use and care, including: hand hygiene, gravity bolus administration of daily water flushes and nutritional supplement, fluids and medications; care of tube insertion site including daily dressing change and cleaning; S&S of infection.  Mr. Panther correctly verbalized dressing change and cleaning procedures, provided correct return demonstration of gravity administration of water and dressing change.  I provided written instructions for PEG flushing/dressing change in support of verbal instruction.  Described contents of Start of Care Bolus Feeding Kit to be provided by [place] day of PEG placement.  He voiced understanding I will be available for ongoing PEG support.  Reviewed instructions for barium sulfate pre-procedure prep rec'd earlier today by pt's wife.  In follow-up to his expressed interest, provide him name/background information/phone number for patient mentor, noted mentor plans to call him this evening.  Wife provided claim form for cancer insurance policy.  I explained I would forward to HIM for processing.  Encouraged them to contact me with questions/concerns prior to PEG/PAC placement scheduled for next Wed and/or New Start RT the following day.  Gayleen Orem, RN, BSN Head & Neck Oncology Nurse [place] at [place] [phone]

## 2017-12-18 NOTE — Telephone Encounter (Signed)
Oncology Nurse Navigator Documentation  Spoke with Edwinna Areola, [facility], requested delivery of Start of Care Bolus Feeding Kit to [place] Stay in support of patient's 10/9 PEG placement.  She voiced understanding.  Gayleen Orem, RN, BSN Head & Neck Oncology Nurse [place] at [place] [phone]

## 2017-12-18 NOTE — Progress Notes (Signed)
Head and Neck Cancer Simulation, IMRT treatment planning, and Special treatment procedure note   Outpatient  Diagnosis:    ICD-10-CM   1. 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 An Aquaplast head and shoulder mask was custom fitted to the patient's anatomy. High-resolution CT axial imaging was obtained of the head and neck with contrast. I verified that the quality of the imaging is good for treatment planning. 1 Medically Necessary Treatment Device was fabricated and supervised by me: Aquaplast mask.  Treatment planning note I plan to treat the patient with IMRT. I plan to treat the patient's tumor and bilateral neck nodes. I plan to treat to a total dose of 70 Gray in 35  fractions. Dose calculation was ordered from dosimetry.  IMRT planning Note  IMRT is medically necessary and an important modality to deliver adequate dose to the patient's at risk tissues while sparing the patient's normal structures, including the: esophagus, parotid tissue, mandible, brain stem, spinal cord, oral cavity, brachial plexus.  This justifies the use of IMRT in the patient's treatment.   Special Treatment Procedure Note:  The patient will be receiving chemotherapy concurrently. Chemotherapy heightens the risk of side effects. I have considered this during the patient's treatment planning process and will monitor the patient accordingly for side effects on a weekly basis. Concurrent chemotherapy increases the complexity of this patient's treatment and therefore this constitutes a special treatment procedure.  -----------------------------------  Eppie Gibson, MD

## 2017-12-19 ENCOUNTER — Encounter: Payer: Self-pay | Admitting: *Deleted

## 2017-12-19 NOTE — Progress Notes (Signed)
[place] Work  Clinical Social Work received Associate Professor from patient's spouse requesting support for patients symptoms of depression.  CSW attempted to contact patient spouse, left voicemail to return call.    Gwinda Maine, LCSW  Clinical Social Worker [facility]

## 2017-12-20 ENCOUNTER — Other Ambulatory Visit: Payer: Self-pay | Admitting: *Deleted

## 2017-12-20 ENCOUNTER — Telehealth: Payer: Self-pay | Admitting: Hematology

## 2017-12-20 NOTE — Telephone Encounter (Signed)
YZ schedule change from [facility] to [facility] 10/10 - moved 10/10 appointments to 10/9 @ 8 am. Spoke with patient. Patient will have ched 1st, then lab/fu to stay on schedule for arrival to [facility] for port placement.

## 2017-12-21 ENCOUNTER — Other Ambulatory Visit: Payer: Self-pay | Admitting: Hematology

## 2017-12-21 DIAGNOSIS — C090 Malignant neoplasm of tonsillar fossa: Secondary | ICD-10-CM | POA: Insufficient documentation

## 2017-12-24 ENCOUNTER — Telehealth: Payer: Self-pay | Admitting: *Deleted

## 2017-12-24 NOTE — Progress Notes (Signed)
FMLA successfully faxed to [place] at [phone]. Mailed copy to patient address on file.

## 2017-12-24 NOTE — Assessment & Plan Note (Addendum)
He met with chemo education nurse earlier this morning. Patient has PEG and port placement scheduled later this afternoon. His radiation is scheduled to start on 12/27/2017. He is scheduled for q3week cisplatin 100mg/m2, 1st dose on 12/28/2017. I reinforced with the patient and his wife at length regarding some of the potential short and long term toxicities from chemotherapy.  Patient has been provided PRN Zofran, Compazine, Ativan, dexamethasone, and liquid morphine concentrate. We will see the patient weekly for electrolyte monitoring, and to determine if he would needed any additional fluid support based on weight and electrolyte abnormalities. 

## 2017-12-24 NOTE — Telephone Encounter (Signed)
Great! Thanks for the update.

## 2017-12-24 NOTE — Telephone Encounter (Signed)
Oncology Nurse Navigator Documentation  Called oral surgeon Dr. Sula Soda office, spoke with Suanne Marker.  I acknowledged Dr. Sula Soda request to hold procedures/tmt for 10-14 days s/p 9/26 extraction of teeth #2 and #31, noted pt scheduled for 10/9 PEG/PAC placement (13th day post-extractions), RT start 10/10, chemo start 10/11 (14th and 15th days post-extraction).  She indicated Mr. Blomquist had post-9/26 extraction follow-up 10/1, was cleared to proceed with tmts/procedures.     Drs. Erasmo Downer and [facility] notified.  Gayleen Orem, RN, BSN Head & Neck Oncology Nurse [place] at [place] [phone]

## 2017-12-25 ENCOUNTER — Encounter: Payer: Self-pay | Admitting: Physical Therapy

## 2017-12-25 ENCOUNTER — Other Ambulatory Visit: Payer: Self-pay | Admitting: Student

## 2017-12-26 ENCOUNTER — Ambulatory Visit (HOSPITAL_COMMUNITY)
Admission: RE | Admit: 2017-12-26 | Discharge: 2017-12-26 | Disposition: A | Discharge status: Home / Self Care | Payer: BLUE CROSS/BLUE SHIELD | Source: Ambulatory Visit | Attending: Hematology | Admitting: Hematology

## 2017-12-26 ENCOUNTER — Other Ambulatory Visit: Payer: Self-pay

## 2017-12-26 ENCOUNTER — Inpatient Hospital Stay: Payer: BLUE CROSS/BLUE SHIELD

## 2017-12-26 ENCOUNTER — Encounter: Payer: Self-pay | Admitting: Hematology

## 2017-12-26 ENCOUNTER — Encounter (HOSPITAL_COMMUNITY): Payer: Self-pay

## 2017-12-26 ENCOUNTER — Inpatient Hospital Stay (HOSPITAL_BASED_OUTPATIENT_CLINIC_OR_DEPARTMENT_OTHER): Payer: BLUE CROSS/BLUE SHIELD | Admitting: Hematology

## 2017-12-26 ENCOUNTER — Telehealth: Payer: Self-pay

## 2017-12-26 ENCOUNTER — Inpatient Hospital Stay: Payer: BLUE CROSS/BLUE SHIELD | Attending: Hematology

## 2017-12-26 VITALS — BP 133/83 | HR 71 | Temp 98.2°F | Resp 18 | Ht 70.0 in | Wt 172.4 lb

## 2017-12-26 DIAGNOSIS — R140 Abdominal distension (gaseous): Secondary | ICD-10-CM | POA: Insufficient documentation

## 2017-12-26 DIAGNOSIS — C090 Malignant neoplasm of tonsillar fossa: Secondary | ICD-10-CM

## 2017-12-26 DIAGNOSIS — Z23 Encounter for immunization: Secondary | ICD-10-CM | POA: Insufficient documentation

## 2017-12-26 DIAGNOSIS — Z79899 Other long term (current) drug therapy: Secondary | ICD-10-CM

## 2017-12-26 DIAGNOSIS — R438 Other disturbances of smell and taste: Secondary | ICD-10-CM | POA: Insufficient documentation

## 2017-12-26 DIAGNOSIS — Z931 Gastrostomy status: Secondary | ICD-10-CM | POA: Diagnosis not present

## 2017-12-26 DIAGNOSIS — F419 Anxiety disorder, unspecified: Secondary | ICD-10-CM | POA: Diagnosis not present

## 2017-12-26 DIAGNOSIS — K1231 Oral mucositis (ulcerative) due to antineoplastic therapy: Secondary | ICD-10-CM | POA: Insufficient documentation

## 2017-12-26 DIAGNOSIS — R634 Abnormal weight loss: Secondary | ICD-10-CM | POA: Insufficient documentation

## 2017-12-26 DIAGNOSIS — R590 Localized enlarged lymph nodes: Secondary | ICD-10-CM

## 2017-12-26 DIAGNOSIS — G4709 Other insomnia: Secondary | ICD-10-CM

## 2017-12-26 DIAGNOSIS — R5383 Other fatigue: Secondary | ICD-10-CM | POA: Diagnosis not present

## 2017-12-26 DIAGNOSIS — R110 Nausea: Secondary | ICD-10-CM | POA: Diagnosis not present

## 2017-12-26 DIAGNOSIS — M542 Cervicalgia: Secondary | ICD-10-CM | POA: Diagnosis not present

## 2017-12-26 DIAGNOSIS — D701 Agranulocytosis secondary to cancer chemotherapy: Secondary | ICD-10-CM | POA: Insufficient documentation

## 2017-12-26 DIAGNOSIS — Z9889 Other specified postprocedural states: Secondary | ICD-10-CM | POA: Diagnosis not present

## 2017-12-26 DIAGNOSIS — C770 Secondary and unspecified malignant neoplasm of lymph nodes of head, face and neck: Secondary | ICD-10-CM | POA: Insufficient documentation

## 2017-12-26 DIAGNOSIS — E860 Dehydration: Secondary | ICD-10-CM | POA: Insufficient documentation

## 2017-12-26 DIAGNOSIS — N179 Acute kidney failure, unspecified: Secondary | ICD-10-CM | POA: Diagnosis not present

## 2017-12-26 DIAGNOSIS — R6881 Early satiety: Secondary | ICD-10-CM | POA: Insufficient documentation

## 2017-12-26 DIAGNOSIS — T451X5S Adverse effect of antineoplastic and immunosuppressive drugs, sequela: Secondary | ICD-10-CM | POA: Insufficient documentation

## 2017-12-26 DIAGNOSIS — Z8249 Family history of ischemic heart disease and other diseases of the circulatory system: Secondary | ICD-10-CM | POA: Diagnosis not present

## 2017-12-26 DIAGNOSIS — E440 Moderate protein-calorie malnutrition: Secondary | ICD-10-CM | POA: Insufficient documentation

## 2017-12-26 DIAGNOSIS — Z5111 Encounter for antineoplastic chemotherapy: Secondary | ICD-10-CM | POA: Diagnosis not present

## 2017-12-26 DIAGNOSIS — C099 Malignant neoplasm of tonsil, unspecified: Secondary | ICD-10-CM | POA: Diagnosis not present

## 2017-12-26 HISTORY — PX: IR IMAGING GUIDED PORT INSERTION: IMG5740

## 2017-12-26 HISTORY — PX: IR GASTROSTOMY TUBE MOD SED: IMG625

## 2017-12-26 LAB — CBC WITH DIFFERENTIAL (CANCER CENTER ONLY)
Abs Immature Granulocytes: 0.03 10*3/uL (ref 0.00–0.07)
Basophils Absolute: 0 10*3/uL (ref 0.0–0.1)
Basophils Relative: 1 %
Eosinophils Absolute: 0.1 10*3/uL (ref 0.0–0.5)
Eosinophils Relative: 2 %
HCT: 46.6 % (ref 39.0–52.0)
Hemoglobin: 15 g/dL (ref 13.0–17.0)
Immature Granulocytes: 1 %
Lymphocytes Relative: 23 %
Lymphs Abs: 1.4 10*3/uL (ref 0.7–4.0)
MCH: 27.6 pg (ref 26.0–34.0)
MCHC: 32.2 g/dL (ref 30.0–36.0)
MCV: 85.8 fL (ref 80.0–100.0)
Monocytes Absolute: 0.5 10*3/uL (ref 0.1–1.0)
Monocytes Relative: 8 %
Neutro Abs: 4.2 10*3/uL (ref 1.7–7.7)
Neutrophils Relative %: 65 %
Platelet Count: 200 10*3/uL (ref 150–400)
RBC: 5.43 MIL/uL (ref 4.22–5.81)
RDW: 12.6 % (ref 11.5–15.5)
WBC Count: 6.2 10*3/uL (ref 4.0–10.5)
nRBC: 0 % (ref 0.0–0.2)

## 2017-12-26 LAB — CBC
HCT: 48 % (ref 39.0–52.0)
Hemoglobin: 15.4 g/dL (ref 13.0–17.0)
MCH: 27.9 pg (ref 26.0–34.0)
MCHC: 32.1 g/dL (ref 30.0–36.0)
MCV: 87 fL (ref 80.0–100.0)
Platelets: 203 10*3/uL (ref 150–400)
RBC: 5.52 MIL/uL (ref 4.22–5.81)
RDW: 12.4 % (ref 11.5–15.5)
WBC: 6 10*3/uL (ref 4.0–10.5)
nRBC: 0 % (ref 0.0–0.2)

## 2017-12-26 LAB — BASIC METABOLIC PANEL - CANCER CENTER ONLY
Anion gap: 9 (ref 5–15)
BUN: 10 mg/dL (ref 6–20)
CO2: 27 mmol/L (ref 22–32)
Calcium: 10 mg/dL (ref 8.9–10.3)
Chloride: 107 mmol/L (ref 98–111)
Creatinine: 0.94 mg/dL (ref 0.61–1.24)
GFR, Est AFR Am: >60 mL/min (ref >60)
GFR, Est Non Af Am: >60 mL/min (ref >60)
Glucose, Bld: 95 mg/dL (ref 70–99)
Potassium: 4.7 mmol/L (ref 3.5–5.1)
Sodium: 143 mmol/L (ref 135–145)

## 2017-12-26 LAB — MAGNESIUM: Magnesium: 2.2 mg/dL (ref 1.7–2.4)

## 2017-12-26 LAB — PROTIME-INR
INR: 0.87
Prothrombin Time: 11.7 seconds (ref 11.4–15.2)

## 2017-12-26 LAB — APTT: aPTT: 25 seconds (ref 24–36)

## 2017-12-26 MED ORDER — FENTANYL CITRATE (PF) 100 MCG/2ML IJ SOLN
INTRAMUSCULAR | Status: AC | PRN
  Administered 2017-12-26 (×4): 50 ug via INTRAVENOUS

## 2017-12-26 MED ORDER — CEFAZOLIN SODIUM-DEXTROSE 2-4 GM/100ML-% IV SOLN
2.0000 g | INTRAVENOUS | Status: AC
  Administered 2017-12-26: 2 g via INTRAVENOUS

## 2017-12-26 MED ORDER — HEPARIN SOD (PORK) LOCK FLUSH 100 UNIT/ML IV SOLN
INTRAVENOUS | Status: AC
  Filled 2017-12-26: qty 5

## 2017-12-26 MED ORDER — MORPHINE SULFATE (CONCENTRATE) 10 MG /0.5 ML PO SOLN: 10.0000 mg | ORAL | 0 refills | Status: AC | PRN

## 2017-12-26 MED ORDER — IOPAMIDOL (ISOVUE-300) INJECTION 61%
INTRAVENOUS | Status: AC
  Filled 2017-12-26: qty 50

## 2017-12-26 MED ORDER — CEFAZOLIN SODIUM-DEXTROSE 2-4 GM/100ML-% IV SOLN
INTRAVENOUS | Status: AC
  Filled 2017-12-26: qty 100

## 2017-12-26 MED ORDER — GLUCAGON HCL RDNA (DIAGNOSTIC) 1 MG IJ SOLR
INTRAMUSCULAR | Status: AC
  Filled 2017-12-26: qty 1

## 2017-12-26 MED ORDER — LIDOCAINE-EPINEPHRINE (PF) 2 %-1:200000 IJ SOLN
INTRAMUSCULAR | Status: AC
  Filled 2017-12-26: qty 20

## 2017-12-26 MED ORDER — LIDOCAINE-EPINEPHRINE (PF) 2 %-1:200000 IJ SOLN
INTRAMUSCULAR | Status: AC | PRN
  Administered 2017-12-26 (×2): 10 mL
  Administered 2017-12-26: 10 mL via INTRADERMAL

## 2017-12-26 MED ORDER — HEPARIN SOD (PORK) LOCK FLUSH 100 UNIT/ML IV SOLN
INTRAVENOUS | Status: AC | PRN
  Administered 2017-12-26: 500 [IU] via INTRAVENOUS

## 2017-12-26 MED ORDER — HYDROMORPHONE HCL 1 MG/ML IJ SOLN
1.0000 mg | INTRAMUSCULAR | Status: DC | PRN
  Administered 2017-12-26: 1 mg via INTRAVENOUS
  Filled 2017-12-26: qty 1

## 2017-12-26 MED ORDER — HYDROCODONE-ACETAMINOPHEN 5-325 MG PO TABS: 1.0000 | ORAL_TABLET | ORAL | Status: DC | PRN

## 2017-12-26 MED ORDER — MIDAZOLAM HCL 2 MG/2ML IJ SOLN
INTRAMUSCULAR | Status: AC | PRN
  Administered 2017-12-26: 1 mg via INTRAVENOUS
  Administered 2017-12-26: 0.5 mg via INTRAVENOUS
  Administered 2017-12-26: 1 mg via INTRAVENOUS
  Administered 2017-12-26: 0.5 mg via INTRAVENOUS
  Administered 2017-12-26: 1 mg via INTRAVENOUS

## 2017-12-26 MED ORDER — FENTANYL CITRATE (PF) 100 MCG/2ML IJ SOLN
INTRAMUSCULAR | Status: AC
  Filled 2017-12-26: qty 4

## 2017-12-26 MED ORDER — IOPAMIDOL (ISOVUE-300) INJECTION 61%
50.0000 mL | Freq: Once | INTRAVENOUS | Status: AC | PRN
  Administered 2017-12-26: 10 mL

## 2017-12-26 MED ORDER — MIDAZOLAM HCL 2 MG/2ML IJ SOLN
INTRAMUSCULAR | Status: AC
  Filled 2017-12-26: qty 4

## 2017-12-26 MED ORDER — SODIUM CHLORIDE 0.9 % IV SOLN
INTRAVENOUS | Status: DC
  Administered 2017-12-26: 12:00:00 via INTRAVENOUS

## 2017-12-26 MED ORDER — GLUCAGON HCL (RDNA) 1 MG IJ SOLR
INTRAMUSCULAR | Status: AC | PRN
  Administered 2017-12-26: 1 mg via INTRAVENOUS

## 2017-12-26 MED FILL — MORPHINE SULF 100 MG/5 ML S: 100 | 30 days supply | Qty: 120 | Fill #0

## 2017-12-26 MED FILL — OXYCODONE-ACETAMINOPHEN 5-3: 5-325 | 3 days supply | Qty: 20 | Fill #0

## 2017-12-26 NOTE — Sedation Documentation (Signed)
Gastrostomy tube procedure begun. 

## 2017-12-26 NOTE — H&P (Signed)
Chief Complaint: Byrd Hesselbach cancer  Referring Physician(s): Zhao,Yan  Supervising Physician: Sandi Mariscal  Patient Status: [facility] - Out-pt  History of Present Illness: Thomas Mccall is a 57 y.o. male who was seen by Dr. Melissa Montane back in August for evaluation of a neck mass.  CT scan showed = 1. Right tonsillar mass measures up to 2.6 cm, highly concerning for a squamous cell carcinoma. 2. Enlarged homogeneous right level 2 lymph node measures up to 3.9 cm, consistent with metastatic disease. 3. Adjacent right level 3 lymph node measures 1.1 cm.  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  He is here today for placement of a Port A Cath and Gastrostomy tube.  He is NPO. He feels ok today. No fever/chills, no N/V.   Past Medical History:  Diagnosis Date  . Headache   . Mass    right tonsil  . Wears glasses     Past Surgical History:  Procedure Laterality Date  . DIRECT LARYNGOSCOPY Right 11/12/2017   Procedure: DIRECT LARYNGOSCOPY;  Surgeon: Melissa Montane, MD;  Location: [place];  Service: ENT;  Laterality: Right;  Direct laryngoscopy and biopy of right tonsil mass  . TONSILLECTOMY AND ADENOIDECTOMY  1970  . WISDOM TOOTH EXTRACTION      Allergies: Patient has no known allergies.  Medications: Prior to Admission medications   Medication Sig Start Date End Date Taking? Authorizing Provider  dexamethasone (DECADRON) 4 MG tablet Take 2 tablets by mouth once a day on the day after chemotherapy and then take 2 tablets two times a day for 2 days. Take with food. 12/12/17   Tish Men, MD  lidocaine-prilocaine (EMLA) cream Apply to affected area once 12/12/17   Tish Men, MD  LORazepam (ATIVAN) 0.5 MG tablet Take 1 tablet (0.5 mg total) by mouth every 6 (six) hours as needed (Nausea or vomiting). 12/12/17   Tish Men, MD  Morphine Sulfate (MORPHINE CONCENTRATE) 10 mg / 0.5 ml concentrated solution Take 0.5 mLs  (10 mg total) by mouth every 3 (three) hours as needed for severe pain. 12/27/17 01/26/18  Tish Men, MD  ondansetron (ZOFRAN) 8 MG tablet Take 1 tablet (8 mg total) by mouth 2 (two) times daily as needed. Start on the third day after chemotherapy. 12/12/17   Tish Men, MD  prochlorperazine (COMPAZINE) 10 MG tablet Take 1 tablet (10 mg total) by mouth every 6 (six) hours as needed (Nausea or vomiting). 12/12/17   Tish Men, MD  sodium fluoride (PREVIDENT 5000 PLUS) 1.1 % CREA dental cream Apply cream to tooth brush. Brush teeth for 2 minutes. Spit out excess. DO NOT rinse afterwards. Repeat nightly. 11/30/17   Lenn Cal, DDS     Family History  Problem Relation Age of Onset  . Heart attack Maternal Grandmother   . Heart attack Maternal Grandfather   . Heart attack Paternal Grandmother   . Heart attack Paternal Grandfather     Social History   Socioeconomic History  . Marital status: Married    Spouse name: Not on file  . Number of children: Not on file  . Years of education: Not on file  . Highest education level: Not on file  Occupational History  . Not on file  Social Needs  . Financial resource strain: Not on file  . Food insecurity:    Worry: Not on file    Inability: Not on file  . Transportation needs:  Medical: Not on file    Non-medical: Not on file  Tobacco Use  . Smoking status: Never Smoker  . Smokeless tobacco: Never Used  Substance and Sexual Activity  . Alcohol use: Not Currently  . Drug use: Never  . Sexual activity: Yes    Partners: Female  Lifestyle  . Physical activity:    Days per week: Not on file    Minutes per session: Not on file  . Stress: Not on file  Relationships  . Social connections:    Talks on phone: Not on file    Gets together: Not on file    Attends religious service: Not on file    Active member of club or organization: Not on file    Attends meetings of clubs or organizations: Not on file    Relationship status: Not on  file  Other Topics Concern  . Not on file  Social History Narrative  . Not on file     Review of Systems: A 12 point ROS discussed and pertinent positives are indicated in the HPI above.  All other systems are negative.  Review of Systems  Physical Exam  Constitutional: He is oriented to person, place, and time. He appears well-developed.  HENT:  Head: Normocephalic and atraumatic.  Eyes: EOM are normal.  Neck: Normal range of motion.  Cardiovascular: Normal rate, regular rhythm and normal heart sounds.  Pulmonary/Chest: Effort normal and breath sounds normal.  Abdominal: Soft.  Musculoskeletal: Normal range of motion.  Neurological: He is alert and oriented to person, place, and time.  Skin: Skin is warm and dry.  Psychiatric: He has a normal mood and affect. His behavior is normal. Judgment and thought content normal.    Imaging: Nm Pet Image Initial (pi) Skull Base To Thigh  Result Date: 11/28/2017 CLINICAL DATA:  Initial treatment strategy for right tonsillar fossa neoplasm. EXAM: NUCLEAR MEDICINE PET SKULL BASE TO THIGH TECHNIQUE: 8.81 mCi F-18 FDG was injected intravenously. Full-ring PET imaging was performed from the skull base to thigh after the radiotracer. CT data was obtained and used for attenuation correction and anatomic localization. Fasting blood glucose: 89 mg/dl COMPARISON:  None. FINDINGS: Mediastinal blood pool activity: SUV max 2.92 NECK: Hypermetabolic lesion within the right tonsillar fossa measures approximately 2.6 cm and has an SUV max equal to 11.03. Enlarged and hypermetabolic right level 2 lymph node measures 2.4 cm and has an SUV max of 10.14. No additional hypermetabolic cervical lymph nodes identified. Incidental CT findings: none CHEST: No hypermetabolic mediastinal or hilar nodes. No suspicious pulmonary nodules on the CT scan. No suspicious pulmonary nodules or masses identified. Incidental CT findings: none ABDOMEN/PELVIS: No abnormal hypermetabolic  activity within the liver, pancreas, adrenal glands, or spleen. No hypermetabolic lymph nodes in the abdomen or pelvis. Incidental CT findings: none SKELETON: No focal hypermetabolic activity to suggest skeletal metastasis. Incidental CT findings: none IMPRESSION: 1. Right tonsillar fossa mass is intensely hypermetabolic compatible with primary neoplasm. 2. Solitary enlarged and hypermetabolic right level 2 cervical lymph node compatible with metastatic adenopathy. 3. No evidence for FDG avid hypermetabolic distant metastasis. Electronically Signed   By: Kerby Moors M.D.   On: 11/28/2017 15:05    Labs:  CBC: Recent Labs    10/26/17 1524 11/12/17 0929 12/12/17 1426 12/26/17 1026 12/26/17 1224  WBC 6.1  --  3.8* 6.2 6.0  HGB 15.3 15.4 14.4 15.0 15.4  HCT 45.2  --  43.8 46.6 48.0  PLT 180.0  --  157 200  203    COAGS: No results for input(s): INR, APTT in the last 8760 hours.  BMP: Recent Labs    12/12/17 1426 12/26/17 1026  NA 142 143  K 3.9 4.7  CL 106 107  CO2 28 27  GLUCOSE 97 95  BUN 11 10  CALCIUM 9.7 10.0  CREATININE 1.17 0.94  GFRNONAA >60 >60  GFRAA >60 >60    LIVER FUNCTION TESTS: Recent Labs    12/12/17 1426  BILITOT 0.5  AST 13*  ALT 20  ALKPHOS 75  PROT 7.3  ALBUMIN 4.2    TUMOR MARKERS: No results for input(s): AFPTM, CEA, CA199, CHROMGRNA in the last 8760 hours.  Assessment and Plan:  SCC of the tonsillar fossa with need for chemotherapy and radiation.  Will proceed with placement of Port A Cath and Gastrostomy tube today by Dr. Pascal Lux.  Risks and benefits of image guided port-a-catheter placement was discussed with the patient including, but not limited to bleeding, infection, pneumothorax, or fibrin sheath development and need for additional procedures.  All of the patient's questions were answered, patient is agreeable to proceed. Consent signed and in chart.  Risks and benefits discussed with the patient including, but not limited to  the need for a barium enema during the procedure, bleeding, infection, peritonitis, or damage to adjacent structures.  All of the patient's questions were answered, patient is agreeable to proceed. Consent signed and in chart.  Thank you for this interesting consult.  I greatly enjoyed meeting Espiridion Supinski and look forward to participating in their care.  A copy of this report was sent to the requesting provider on this date.  Electronically Signed: Murrell Redden, PA-C   12/26/2017, 12:40 PM      I spent a total of  30 Minutes in face to face in clinical consultation, greater than 50% of which was counseling/coordinating care for [place] and [place].

## 2017-12-26 NOTE — Sedation Documentation (Signed)
Port a cath procedure completed.

## 2017-12-26 NOTE — Discharge Instructions (Addendum)
Please continue clear liquids for the rest of today.  Implanted Port Insertion, Care After This sheet gives you information about how to care for yourself after your procedure. Your health care provider may also give you more specific instructions. If you have problems or questions, contact your health care provider. What can I expect after the procedure? After your procedure, it is common to have:  Discomfort at the port insertion site.  Bruising on the skin over the port. This should improve over 3-4 days.  Follow these instructions at home: [facility] care  After your port is placed, you will get a manufacturer's information card. The card has information about your port. Keep this card with you at all times.  Take care of the port as told by your health care provider. Ask your health care provider if you or a family member can get training for taking care of the port at home. A home health care nurse may also take care of the port.  Make sure to remember what type of port you have. Incision care  Follow instructions from your health care provider about how to take care of your port insertion site. Make sure you: ? Wash your hands with soap and water before you change your bandage (dressing). If soap and water are not available, use hand sanitizer. ? Change your dressing as told by your health care provider.   ? Leave skin glue in place. These skin closures may need to stay in place for 2 weeks or longer. If adhesive strip edges start to loosen and curl up, you may trim the loose edges. Do not remove adhesive strips completely unless your health care provider tells you to do that.  DO NOT use EMLA cream for 2 weeks after port placement as this cream will remove surgical glue on your incision.  Check your port insertion site every day for signs of infection. Check for: ? More redness, swelling, or pain. ? More fluid or blood. ? Warmth. ? Pus or a bad smell. General instructions  Do not  take baths, swim, or use a hot tub until your health care provider approves.  You may shower tomorrow.  Do not lift anything that is heavier than 10 lb (4.5 kg) for a week, or as told by your health care provider.  Ask your health care provider when it is okay to: ? Return to work or school. ? Resume usual physical activities or sports.  Do not drive for 24 hours if you were given a medicine to help you relax (sedative).  Take over-the-counter and prescription medicines only as told by your health care provider.  Wear a medical alert bracelet in case of an emergency. This will tell any health care providers that you have a port.  Keep all follow-up visits as told by your health care provider. This is important. Contact a health care provider if:  You cannot flush your port with saline as directed, or you cannot draw blood from the port.  You have a fever or chills.  You have more redness, swelling, or pain around your port insertion site.  You have more fluid or blood coming from your port insertion site.  Your port insertion site feels warm to the touch.  You have pus or a bad smell coming from the port insertion site. Get help right away if:  You have chest pain or shortness of breath.  You have bleeding from your port that you cannot control. Summary  Take  care of the port as told by your health care provider.  Change your dressing as told by your health care provider.  Keep all follow-up visits as told by your health care provider. This information is not intended to replace advice given to you by your health care provider. Make sure you discuss any questions you have with your health care provider. Document Released: 12/25/2012 Document Revised: 01/26/2016 Document Reviewed: 01/26/2016 Elsevier Interactive Patient Education  2017 Elsevier Inc.   PEG Tube Home Guide A PEG tube is used to put food and fluids into the stomach. Before you leave the hospital, make sure  that you know:  How to care for your PEG tube.  How to care for the opening (stoma) in your belly.  How to give yourself feedings and medicines.  When to call your doctor for help.  How do I care for my peg tube? Check your PEG tube every day. Make sure:  It is not too tight.  It is in the right place. There is a mark on the tube that shows when the tube is in the right place. Adjust the tube if you need to.  How do I care for my stoma? Clean your stoma every day. Follow these steps: 1. Wash your hands with soap and water. 2. Wash the stoma gently with warm, soapy water. 3. Rinse the stoma with warm water. 4. Pat the stoma area dry.  Check your stoma for:  Redness.  Leaking.  Skin irritation.  How do I give a feeding? Your doctor will tell you:  How much nutrition and fluid you will need for each feeding.  How often to have a feeding.  Whether you should take medicine in the tube by itself or with a feeding.  To give yourself a feeding, follow these steps. 1. Lay out all of the things that you will need. 2. Make sure that the nutritional formula is at room temperature. 3. Wash your hands with soap and water. 4. Sit up or stand up straight. You will need to stay straight while you give yourself a feeding. 5. Make sure the syringe plunger is pushed in. Place the tip of the syringe in clear water, and slowly pull the plunger to bring (draw up) the water into the syringe. 6. Remove the clamp and the cap from the PEG tube. 7. Push the water out of the syringe to clean (flush) the tube. 8. If the tube is clear, draw up the formula into the syringe. Make sure to use the right amount for each feeding. Add water if you need to. 9. Slowly push the formula from the syringe through the tube. 10. After the feeding, flush the tube with water. 11. Put the clamp and the cap on the tube. 12. Stay sitting up or standing up straight for at least 30 minutes.  How do I give  medicine? To give yourself medicine, follow these steps: 1. Lay out all of the things that you will need. 2. If your medicine is in tablet form, crush it and dissolve it in water. 3. Wash your hands with soap and water. 4. Sit up or stand up straight. You will need to stay straight while you give yourself medicine. 5. Make sure the syringe plunger is pushed in. Place the tip of the syringe in clear water, and slowly pull the plunger to bring (draw up) the water into the syringe. 6. Remove the clamp and the cap from the PEG tube. 7. Push  the water out of the syringe to clean (flush) the tube. 8. If the tube is clear, draw up the medicine into the syringe. 9. Slowly push the medicine from the syringe through the tube. 10. Flush the tube with water. 11. Put the clamp and the cap on the tube. 12. Stay sitting up or standing up straight for at least 30 minutes.  You should not take sustained release (SR) medicines through your tube. If you are not sure if your medicine is a SR medicine, ask your doctor or pharmacist. Get help if:  The area around your stoma is sore, irritated, or red.  You have belly pain or bloating while you are feeding or afterward.  You have a feeling of being sick to your stomach (nausea) that does not go away.  You cannot poop (constipated) or you have watery poop (diarrhea) for a long time.  You have a fever.  You have problems with your PEG tube. Get help right away if:  Your tube is blocked.  Your tube falls out.  You have pain around your tube.  You are bleeding from your tube.  Your tube is leaking.  You choke or you have trouble breathing while you are feeding or afterward. This information is not intended to replace advice given to you by your health care provider. Make sure you discuss any questions you have with your health care provider. Document Released: 04/08/2010 Document Revised: 08/12/2015 Document Reviewed: 03/11/2014 Elsevier Interactive  Patient Education  2018 [place]. Moderate Conscious Sedation, Adult, Care After These instructions provide you with information about caring for yourself after your procedure. Your health care provider may also give you more specific instructions. Your treatment has been planned according to current medical practices, but problems sometimes occur. Call your health care provider if you have any problems or questions after your procedure. What can I expect after the procedure? After your procedure, it is common:  To feel sleepy for several hours.  To feel clumsy and have poor balance for several hours.  To have poor judgment for several hours.  To vomit if you eat too soon.  Follow these instructions at home: For at least 24 hours after the procedure:   Do not: ? Participate in activities where you could fall or become injured. ? Drive. ? Use heavy machinery. ? Drink alcohol. ? Take sleeping pills or medicines that cause drowsiness. ? Make important decisions or sign legal documents. ? Take care of children on your own.  Rest. Eating and drinking  Follow the diet recommended by your health care provider.  If you vomit: ? Drink water, juice, or soup when you can drink without vomiting. ? Make sure you have little or no nausea before eating solid foods. General instructions  Have a responsible adult stay with you until you are awake and alert.  Take over-the-counter and prescription medicines only as told by your health care provider.  If you smoke, do not smoke without supervision.  Keep all follow-up visits as told by your health care provider. This is important. Contact a health care provider if:  You keep feeling nauseous or you keep vomiting.  You feel light-headed.  You develop a rash.  You have a fever. Get help right away if:  You have trouble breathing. This information is not intended to replace advice given to you by your health care provider. Make  sure you discuss any questions you have with your health care provider. Document Released: 12/25/2012 Document  Revised: 08/09/2015 Document Reviewed: 06/26/2015 Elsevier Interactive Patient Education  [employer].

## 2017-12-26 NOTE — Telephone Encounter (Signed)
Left a voice messagel of the upcoming appointment that was scheduled by (H.P scheduler) from the book for November 1st for Cisplatin 6.5 hr. Per 10/9 los

## 2017-12-26 NOTE — Progress Notes (Signed)
[facility] OFFICE PROGRESS NOTE  Patient Care Team: Martinique, Betty G, MD as PCP - General (Family Medicine) Eppie Gibson, MD as Attending Physician (Radiation Oncology) Tish Men, MD as Consulting Physician (Hematology) Leota Sauers, RN as Oncology Nurse Navigator (Oncology) Karie Mainland, RD as Dietitian (Nutrition) Sharen Counter, CCC-SLP as Speech Language Pathologist (Speech Pathology) Wynelle Beckmann, Melodie Bouillon, PT as Physical Therapist (Physical Therapy)  BRIEF ONCOLOGIC SUMMARY: 1. 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 PEG and port placement on 12/26/2017  ASSESSMENT & PLAN:  Malignant neoplasm of tonsillar fossa [facility]) He met with chemo education nurse earlier this morning. Patient has PEG and port placement scheduled later this afternoon. His radiation is scheduled to start on 12/27/2017. He is scheduled for q3week cisplatin '100mg'$ /m2, 1st dose on 12/28/2017. I discussed with the patient and his wife at length regarding some of the potential short and long term toxicities from chemotherapy.  Patient has been provided PRN Zofran, Compazine, Ativan, dexamethasone, and liquid morphine concentrate. We will see the patient weekly for electrolyte monitoring, and to determine if he would needed any additional fluid support based on weight and electrolyte abnormalities.  Right Neck/Tonsillar Pain -Mild; OTC Tylenol PRN -Patient has been prescribed PRN liquid morphine concentrate for severe pain related to chemotherapy and radiation   All questions were answered. The patient knows to call the clinic with any problems, questions or concerns. No barriers to learning was detected.  A total of more than 25 minutes were spent face-to-face with the patient during  this encounter and over half of that time was spent on counseling and coordination of care as outlined above.   Weekly labs prior to clinic appts. RTC with me at [facility] for toxicity checks on 10/17, 10/31, and 11/7.   Tish Men, MD 12/26/2017 11:32 AM  CHIEF COMPLAINT: "I am here for PEG and port"  INTERVAL HISTORY: Thomas Mccall returns to clinic for follow-up of right tonsillar cancer prior to starting chemotherapy on 12/28/2017.  He is accompanied by his wife.  He reports mild intermittent sore throat, usually in the morning, which he attributes to seasonal allergy, as well as mild right-sided neck pain.  He reports intermittent anxiety and insomnia from his upcoming radiation and chemotherapy treatment, but denies constant anxiety or depression.    He is scheduled for PEG and port placement today.  His radiation is scheduled to start on 12/27/2017.  SUMMARY OF ONCOLOGIC HISTORY:   Malignant neoplasm of tonsillar fossa ([place])   10/30/2017 Miscellaneous    Presented to Dr. Redmond Baseman with 3 months of R neck mass    11/07/2017 Imaging    CT neck w/ contrast:  1. Right tonsillar mass measures up to 2.6 cm, highly concerning for a squamous cell carcinoma. 2. Enlarged homogeneous right level 2 lymph node measures up to 3.9 cm, consistent with metastatic disease. 3. Adjacent right level 3 lymph node measures 1.1 cm. 4. Lucency at C4 on the left is likely degenerative. Metastatic disease is not excluded. Please correlate with findings on PET scan if used for further evaluation.    11/12/2017 Procedure    R tonsillar biopsy by Dr. Janace Hoard    11/12/2017 Pathology Results    Accession: [id number] Invasive squamous cell carcinoma; p16 diffusely positive    11/28/2017  Imaging    PET scan: 1. Right tonsillar fossa mass is intensely hypermetabolic compatible with primary neoplasm. 2. Solitary enlarged and hypermetabolic right level 2 cervical lymph node compatible with metastatic  adenopathy. 3. No evidence for FDG avid hypermetabolic distant metastasis.    11/30/2017 Cancer Staging    Staging form: Pharynx - HPV-Mediated Oropharynx, AJCC 8th Edition - Clinical stage from 11/30/2017: Stage I (cT2, cN1, cM0, p16+) - Signed by Eppie Gibson, MD on 11/30/2017    12/07/2017 Miscellaneous    ENT evaluation to [facility] recommended against surgical resection due to the tumor extending into the right inferior nasopharynx and posterior soft palate, which would require resection of almost 1/2 of the soft palate and the right nasopharyngeal wall extending into right posterior/lateral posterior wall.    12/12/2017 -  Chemotherapy    The patient had palonosetron (ALOXI) injection 0.25 mg, 0.25 mg, Intravenous,  Once, 0 of 3 cycles CISplatin (PLATINOL) 196 mg in sodium chloride 0.9 % 500 mL chemo infusion, 100 mg/m2 = 196 mg, Intravenous,  Once, 0 of 3 cycles fosaprepitant (EMEND) 150 mg, dexamethasone (DECADRON) 12 mg in sodium chloride 0.9 % 145 mL IVPB, , Intravenous,  Once, 0 of 3 cycles  for chemotherapy treatment.      Procedure    12/26/2017: port and PEG tube placement      REVIEW OF SYSTEMS:   Constitutional: ( - ) fevers, ( - )  chills , ( - ) night sweats Eyes: ( - ) blurriness of vision, ( - ) double vision, ( - ) watery eyes Ears, nose, mouth, throat, and face: ( - ) mucositis, ( + ) sore throat Respiratory: ( - ) cough, ( - ) dyspnea, ( - ) wheezes Cardiovascular: ( - ) palpitation, ( - ) chest discomfort, ( - ) lower extremity swelling Gastrointestinal:  ( - ) nausea, ( - ) heartburn, ( - ) change in bowel habits Skin: ( - ) abnormal skin rashes Lymphatics: ( - ) new lymphadenopathy, ( - ) easy bruising Neurological: ( - ) numbness, ( - ) tingling, ( - ) new weaknesses Behavioral/Psych: ( - ) mood change, ( - ) new changes  All other systems were reviewed with the patient and are negative.  I have reviewed the past medical history, past surgical history, social  history and family history with the patient and they are unchanged from previous note.  ALLERGIES:  has No Known Allergies.  MEDICATIONS:  Current Outpatient Medications  Medication Sig Dispense Refill  . dexamethasone (DECADRON) 4 MG tablet Take 2 tablets by mouth once a day on the day after chemotherapy and then take 2 tablets two times a day for 2 days. Take with food. 30 tablet 1  . lidocaine-prilocaine (EMLA) cream Apply to affected area once 30 g 3  . LORazepam (ATIVAN) 0.5 MG tablet Take 1 tablet (0.5 mg total) by mouth every 6 (six) hours as needed (Nausea or vomiting). 30 tablet 0  . Morphine Sulfate (MORPHINE CONCENTRATE) 10 mg / 0.5 ml concentrated solution Take 0.5 mLs (10 mg total) by mouth every 3 (three) hours as needed for severe pain. 120 mL 0  . ondansetron (ZOFRAN) 8 MG tablet Take 1 tablet (8 mg total) by mouth 2 (two) times daily as needed. Start on the third day after chemotherapy. 30 tablet 1  . prochlorperazine (COMPAZINE) 10 MG tablet Take 1 tablet (10 mg total) by mouth every 6 (six) hours as needed (Nausea or vomiting). 30 tablet  1  . sodium fluoride (PREVIDENT 5000 PLUS) 1.1 % CREA dental cream Apply cream to tooth brush. Brush teeth for 2 minutes. Spit out excess. DO NOT rinse afterwards. Repeat nightly. 1 Tube prn   No current facility-administered medications for this visit.     PHYSICAL EXAMINATION: ECOG PERFORMANCE STATUS: 1 - Symptomatic but completely ambulatory  Vitals:   12/26/17 1039  BP: 133/83  Pulse: 71  Resp: 18  Temp: 98.2 F (36.8 C)  SpO2: 100%   Filed Weights   12/26/17 1039  Weight: 172 lb 6.4 oz (78.2 kg)    GENERAL: alert, no distress and comfortable SKIN: skin color, texture, turgor are normal, no rashes or significant lesions EYES: conjunctiva are pink and non-injected, sclera clear OROPHARYNX: no exudate, no erythema; lips, buccal mucosa, and tongue normal  NECK: supple, mild tenderness in the right neck near the angle of the  jaw LYMPH: stable ~4x3cm Level II R cervical LN, no other palpable cervical or axillary lymphadenopathy LUNGS: clear to auscultation and percussion with normal breathing effort HEART: regular rate & rhythm and no murmurs and no lower extremity edema ABDOMEN: soft, non-tender, non-distended, normal bowel sounds Musculoskeletal: no cyanosis of digits and no clubbing  PSYCH: alert & oriented x 3, fluent speech NEURO: no focal motor/sensory deficits  LABORATORY DATA:  I have reviewed the data as listed    Component Value Date/Time   NA 143 12/26/2017 1026   K 4.7 12/26/2017 1026   CL 107 12/26/2017 1026   CO2 27 12/26/2017 1026   GLUCOSE 95 12/26/2017 1026   BUN 10 12/26/2017 1026   CREATININE 0.94 12/26/2017 1026   CALCIUM 10.0 12/26/2017 1026   PROT 7.3 12/12/2017 1426   ALBUMIN 4.2 12/12/2017 1426   AST 13 (L) 12/12/2017 1426   ALT 20 12/12/2017 1426   ALKPHOS 75 12/12/2017 1426   BILITOT 0.5 12/12/2017 1426   GFRNONAA >60 12/26/2017 1026   GFRAA >60 12/26/2017 1026    No results found for: SPEP, UPEP  Lab Results  Component Value Date   WBC 6.2 12/26/2017   NEUTROABS 4.2 12/26/2017   HGB 15.0 12/26/2017   HCT 46.6 12/26/2017   MCV 85.8 12/26/2017   PLT 200 12/26/2017      Chemistry      Component Value Date/Time   NA 143 12/26/2017 1026   K 4.7 12/26/2017 1026   CL 107 12/26/2017 1026   CO2 27 12/26/2017 1026   BUN 10 12/26/2017 1026   CREATININE 0.94 12/26/2017 1026      Component Value Date/Time   CALCIUM 10.0 12/26/2017 1026   ALKPHOS 75 12/12/2017 1426   AST 13 (L) 12/12/2017 1426   ALT 20 12/12/2017 1426   BILITOT 0.5 12/12/2017 1426      RADIOGRAPHIC STUDIES: I have personally reviewed the radiological images as listed below and agreed with the findings in the report. Nm Pet Image Initial (pi) Skull Base To Thigh  Result Date: 11/28/2017 CLINICAL DATA:  Initial treatment strategy for right tonsillar fossa neoplasm. EXAM: NUCLEAR MEDICINE PET  SKULL BASE TO THIGH TECHNIQUE: 8.81 mCi F-18 FDG was injected intravenously. Full-ring PET imaging was performed from the skull base to thigh after the radiotracer. CT data was obtained and used for attenuation correction and anatomic localization. Fasting blood glucose: 89 mg/dl COMPARISON:  None. FINDINGS: Mediastinal blood pool activity: SUV max 2.92 NECK: Hypermetabolic lesion within the right tonsillar fossa measures approximately 2.6 cm and has an SUV max equal to 11.03. Enlarged  and hypermetabolic right level 2 lymph node measures 2.4 cm and has an SUV max of 10.14. No additional hypermetabolic cervical lymph nodes identified. Incidental CT findings: none CHEST: No hypermetabolic mediastinal or hilar nodes. No suspicious pulmonary nodules on the CT scan. No suspicious pulmonary nodules or masses identified. Incidental CT findings: none ABDOMEN/PELVIS: No abnormal hypermetabolic activity within the liver, pancreas, adrenal glands, or spleen. No hypermetabolic lymph nodes in the abdomen or pelvis. Incidental CT findings: none SKELETON: No focal hypermetabolic activity to suggest skeletal metastasis. Incidental CT findings: none IMPRESSION: 1. Right tonsillar fossa mass is intensely hypermetabolic compatible with primary neoplasm. 2. Solitary enlarged and hypermetabolic right level 2 cervical lymph node compatible with metastatic adenopathy. 3. No evidence for FDG avid hypermetabolic distant metastasis. Electronically Signed   By: Kerby Moors M.D.   On: 11/28/2017 15:05

## 2017-12-26 NOTE — Telephone Encounter (Signed)
Printed avs and calender of upcoming appointment. Per 10/9 los added appointment for 11/1 to book for 6.5 hr. Infusion. Also spoke with Charlies Constable' concerning appointments that are needed to be scheduled at the [facility]. campus

## 2017-12-26 NOTE — Procedures (Signed)
Pre Procedure Dx: Tonsillar CA, poor venous access Post Procedural Dx: Same  Successful placement of right IJ approach port-a-cath with tip at the superior caval atrial junction. The catheter is ready for immediate use.  Successful fluoroscopic guided insertion of gastrostomy tube.   The gastrostomy tube may be used immediately for medications.   Tube feeds may be initiated in 24 hours as per the primary team.    EBL: Minimal Complications: None immediate  Ronny Bacon, MD Pager #: [phone]

## 2017-12-27 ENCOUNTER — Other Ambulatory Visit: Payer: Self-pay

## 2017-12-27 ENCOUNTER — Ambulatory Visit: Payer: Self-pay | Admitting: Hematology

## 2017-12-27 ENCOUNTER — Encounter: Payer: Self-pay | Admitting: *Deleted

## 2017-12-27 ENCOUNTER — Ambulatory Visit
Admission: RE | Admit: 2017-12-27 | Discharge: 2017-12-27 | Disposition: A | Discharge status: Home / Self Care | Payer: BLUE CROSS/BLUE SHIELD | Source: Ambulatory Visit | Attending: Radiation Oncology | Admitting: Radiation Oncology

## 2017-12-27 ENCOUNTER — Telehealth: Payer: Self-pay | Admitting: Family Medicine

## 2017-12-27 DIAGNOSIS — C090 Malignant neoplasm of tonsillar fossa: Secondary | ICD-10-CM

## 2017-12-27 MED ORDER — SONAFINE EX EMUL
1.0000 "application " | Freq: Once | CUTANEOUS | Status: AC
  Administered 2017-12-27: 1 via TOPICAL

## 2017-12-27 NOTE — Progress Notes (Signed)
Oncology Nurse Navigator Documentation  Met with Thomas Mccall and his wife during Est. Pt. appt with Dr. Maylon Peppers.   [name] has PAC/PEG placement later this morning, begins RT tomorrow and HD cisplatin on Friday. He expressed generalized anxiety re the above, was encouraged by Dr. Maylon Peppers to take currently prescribed Ativan as instructed. [name] voiced agreement to follow-ups with Dr. Maylon Peppers at [facility] during upcoming weeks. I escorted them to Scheduling following appt, facilitated scheduling of future appts.  I encouraged them to contact me with needs/concerns.  They voiced agreement.  Gayleen Orem, RN, BSN Head & Neck Oncology Nurse [place] at [place] [phone]

## 2017-12-27 NOTE — Telephone Encounter (Signed)
Copied from [facility] [phone]. Topic: General - Other >> Dec 27, 2017  3:25 PM Valla Leaver wrote: Reason for CRM: Wife, Amy, calling to notify Dr. Martinique that the patient was diagnosed with tonsil cancer. Having radiation and chemo today and tomorrow but he is experiencing depression and anxiety. Patient had feeding tube placed yesterday. He's withdrawn and sleeping all the time. FYI Patient designated "no one" on his DPR.

## 2017-12-27 NOTE — Progress Notes (Signed)
Oncology Nurse Navigator Documentation  To provide support, encouragement and care continuity, met with [name] for his initial  RT.  [name] who waited in Radiation Waiting.  He arrived with notable but managed abdominal pain s/p yesterday's PEG placement.  He reported changing PEG dsg this morning wo/ difficulty.  I reviewed the treatment process, answered questions.   He completed treatment without difficulty, denied questions/concerns.  I escorted him and his wife to [place] for Post-SIM Ed with RN Anderson Malta Malmfelt.  I encouraged them to call me with questions/concerns as tmts proceed.  They acknowledged.  Thomas Orem, RN, BSN Head & Neck Oncology Nurse [place] at [place] [phone]

## 2017-12-27 NOTE — Progress Notes (Signed)

## 2017-12-28 ENCOUNTER — Encounter: Payer: Self-pay | Admitting: Medical

## 2017-12-28 ENCOUNTER — Encounter: Payer: Self-pay | Admitting: *Deleted

## 2017-12-28 ENCOUNTER — Encounter: Payer: Self-pay | Admitting: Hematology

## 2017-12-28 ENCOUNTER — Inpatient Hospital Stay: Payer: BLUE CROSS/BLUE SHIELD

## 2017-12-28 ENCOUNTER — Ambulatory Visit
Admission: RE | Admit: 2017-12-28 | Discharge: 2017-12-28 | Disposition: A | Discharge status: Home / Self Care | Payer: BLUE CROSS/BLUE SHIELD | Source: Ambulatory Visit | Attending: Radiation Oncology | Admitting: Radiation Oncology

## 2017-12-28 VITALS — BP 136/85 | HR 75 | Temp 97.7°F | Resp 18

## 2017-12-28 DIAGNOSIS — C090 Malignant neoplasm of tonsillar fossa: Secondary | ICD-10-CM

## 2017-12-28 DIAGNOSIS — Z23 Encounter for immunization: Secondary | ICD-10-CM

## 2017-12-28 MED ORDER — GI COCKTAIL ~~LOC~~
30.0000 mL | Freq: Once | ORAL | Status: AC
  Administered 2017-12-28: 30 mL via ORAL
  Filled 2017-12-28: qty 30

## 2017-12-28 MED ORDER — SODIUM CHLORIDE 0.9 % IV SOLN
Freq: Once | INTRAVENOUS | Status: AC
  Administered 2017-12-28: 13:00:00 via INTRAVENOUS
  Filled 2017-12-28: qty 5

## 2017-12-28 MED ORDER — INFLUENZA VAC SPLIT QUAD 0.5 ML IM SUSY
0.5000 mL | PREFILLED_SYRINGE | Freq: Once | INTRAMUSCULAR | Status: AC
  Administered 2017-12-28: 0.5 mL via INTRAMUSCULAR

## 2017-12-28 MED ORDER — SODIUM CHLORIDE 0.9 % IV SOLN
100.0000 mg/m2 | Freq: Once | INTRAVENOUS | Status: AC
  Administered 2017-12-28: 196 mg via INTRAVENOUS
  Filled 2017-12-28: qty 196

## 2017-12-28 MED ORDER — POTASSIUM CHLORIDE 2 MEQ/ML IV SOLN
Freq: Once | INTRAVENOUS | Status: AC
  Administered 2017-12-28: 11:00:00 via INTRAVENOUS
  Filled 2017-12-28: qty 10

## 2017-12-28 MED ORDER — SODIUM CHLORIDE 0.9% FLUSH
10.0000 mL | INTRAVENOUS | Status: DC | PRN
  Administered 2017-12-28: 10 mL
  Filled 2017-12-28: qty 10

## 2017-12-28 MED ORDER — INFLUENZA VAC SPLIT QUAD 0.5 ML IM SUSY
PREFILLED_SYRINGE | INTRAMUSCULAR | Status: AC
  Filled 2017-12-28: qty 0.5

## 2017-12-28 MED ORDER — HEPARIN SOD (PORK) LOCK FLUSH 100 UNIT/ML IV SOLN
500.0000 [IU] | Freq: Once | INTRAVENOUS | Status: AC | PRN
  Administered 2017-12-28: 500 [IU]
  Filled 2017-12-28: qty 5

## 2017-12-28 MED ORDER — PALONOSETRON HCL INJECTION 0.25 MG/5ML
0.2500 mg | Freq: Once | INTRAVENOUS | Status: AC
  Administered 2017-12-28: 0.25 mg via INTRAVENOUS

## 2017-12-28 MED ORDER — PALONOSETRON HCL INJECTION 0.25 MG/5ML
INTRAVENOUS | Status: AC
  Filled 2017-12-28: qty 5

## 2017-12-28 NOTE — Progress Notes (Signed)
Oncology Nurse Navigator Documentation  Met with Thomas Mccall in Infusion where he was receiving 1st dose of cisplatin to address questions re post-PEG placement diet/persistent burping. Per wife, she called [facility] on-call last HS re diet guidance, onset of intermittent chills.  She was directed by Dr. Earlie Server to continued liquid diet until assessed today. She reported:  Determined husband's chills corresponded to anxiety and uncontrolled abdominal pain in area of PEG placement, resolved after taking Percocet.  He has been afebrile, temp this morning 98.3. He reported persistent burping which aggravates PEG insertion site. Following discussion/update with Dr. Earlie Server and [facility] PA Thomas Mccall, I encouraged:  Advancing diet as tolerated.  Use of liquid morphine as analgestic vs Percocet which could mask fever.  Encouraged same-time daily temp recording, call [facility]/arrive to ED if temp >100.4.  Increased ambulation as tolerated to further express air from stomach.  OTC simethicone product, eg GasEx, to address gastric gas.  Gayleen Orem, RN, BSN Head & Neck Oncology Nurse [place] at [place] [phone]

## 2017-12-28 NOTE — Telephone Encounter (Signed)
FYI sent to Dr. Jordan 

## 2017-12-28 NOTE — Patient Instructions (Addendum)
[facility] Discharge Instructions for Patients Receiving Chemotherapy  Today you received the following chemotherapy agents: Cisplatin.   To help prevent nausea and vomiting after your treatment, we encourage you to take your nausea medication as directed.  If you develop nausea and vomiting that is not controlled by your nausea medication, call the clinic.   BELOW ARE SYMPTOMS THAT SHOULD BE REPORTED IMMEDIATELY:  *FEVER GREATER THAN 100.5 F  *CHILLS WITH OR WITHOUT FEVER  NAUSEA AND VOMITING THAT IS NOT CONTROLLED WITH YOUR NAUSEA MEDICATION  *UNUSUAL SHORTNESS OF BREATH  *UNUSUAL BRUISING OR BLEEDING  TENDERNESS IN MOUTH AND THROAT WITH OR WITHOUT PRESENCE OF ULCERS  *URINARY PROBLEMS  *BOWEL PROBLEMS  UNUSUAL RASH Items with * indicate a potential emergency and should be followed up as soon as possible.  Feel free to call the clinic should you have any questions or concerns. The clinic phone number is [phone].  Please show the CHEMO ALERT CARD at check-in to the Emergency Department and triage nurse.   Cisplatin injection What is this medicine? CISPLATIN (SIS pla tin) is a chemotherapy drug. It targets fast dividing cells, like cancer cells, and causes these cells to die. This medicine is used to treat many types of cancer like bladder, ovarian, and testicular cancers. This medicine may be used for other purposes; ask your health care provider or pharmacist if you have questions. COMMON BRAND NAME(S): Platinol, Platinol -AQ What should I tell my health care provider before I take this medicine? They need to know if you have any of these conditions: -blood disorders -hearing problems -kidney disease -recent or ongoing radiation therapy -an unusual or allergic reaction to cisplatin, carboplatin, other chemotherapy, other medicines, foods, dyes, or preservatives -pregnant or trying to get pregnant -breast-feeding How should I use this  medicine? This drug is given as an infusion into a vein. It is administered in a hospital or clinic by a specially trained health care professional. Talk to your pediatrician regarding the use of this medicine in children. Special care may be needed. Overdosage: If you think you have taken too much of this medicine contact a poison control center or emergency room at once. NOTE: This medicine is only for you. Do not share this medicine with others. What if I miss a dose? It is important not to miss a dose. Call your doctor or health care professional if you are unable to keep an appointment. What may interact with this medicine? -dofetilide -foscarnet -medicines for seizures -medicines to increase blood counts like filgrastim, pegfilgrastim, sargramostim -probenecid -pyridoxine used with altretamine -rituximab -some antibiotics like amikacin, gentamicin, neomycin, polymyxin B, streptomycin, tobramycin -sulfinpyrazone -vaccines -zalcitabine Talk to your doctor or health care professional before taking any of these medicines: -acetaminophen -aspirin -ibuprofen -ketoprofen -naproxen This list may not describe all possible interactions. Give your health care provider a list of all the medicines, herbs, non-prescription drugs, or dietary supplements you use. Also tell them if you smoke, drink alcohol, or use illegal drugs. Some items may interact with your medicine. What should I watch for while using this medicine? Your condition will be monitored carefully while you are receiving this medicine. You will need important blood work done while you are taking this medicine. This drug may make you feel generally unwell. This is not uncommon, as chemotherapy can affect healthy cells as well as cancer cells. Report any side effects. Continue your course of treatment even though you feel ill unless your doctor tells you to   stop. In some cases, you may be given additional medicines to help with side  effects. Follow all directions for their use. Call your doctor or health care professional for advice if you get a fever, chills or sore throat, or other symptoms of a cold or flu. Do not treat yourself. This drug decreases your body's ability to fight infections. Try to avoid being around people who are sick. This medicine may increase your risk to bruise or bleed. Call your doctor or health care professional if you notice any unusual bleeding. Be careful brushing and flossing your teeth or using a toothpick because you may get an infection or bleed more easily. If you have any dental work done, tell your dentist you are receiving this medicine. Avoid taking products that contain aspirin, acetaminophen, ibuprofen, naproxen, or ketoprofen unless instructed by your doctor. These medicines may hide a fever. Do not become pregnant while taking this medicine. Women should inform their doctor if they wish to become pregnant or think they might be pregnant. There is a potential for serious side effects to an unborn child. Talk to your health care professional or pharmacist for more information. Do not breast-feed an infant while taking this medicine. Drink fluids as directed while you are taking this medicine. This will help protect your kidneys. Call your doctor or health care professional if you get diarrhea. Do not treat yourself. What side effects may I notice from receiving this medicine? Side effects that you should report to your doctor or health care professional as soon as possible: -allergic reactions like skin rash, itching or hives, swelling of the face, lips, or tongue -signs of infection - fever or chills, cough, sore throat, pain or difficulty passing urine -signs of decreased platelets or bleeding - bruising, pinpoint red spots on the skin, black, tarry stools, nosebleeds -signs of decreased red blood cells - unusually weak or tired, fainting spells, lightheadedness -breathing  problems -changes in hearing -gout pain -low blood counts - This drug may decrease the number of white blood cells, red blood cells and platelets. You may be at increased risk for infections and bleeding. -nausea and vomiting -pain, swelling, redness or irritation at the injection site -pain, tingling, numbness in the hands or feet -problems with balance, movement -trouble passing urine or change in the amount of urine Side effects that usually do not require medical attention (report to your doctor or health care professional if they continue or are bothersome): -changes in vision -loss of appetite -metallic taste in the mouth or changes in taste This list may not describe all possible side effects. Call your doctor for medical advice about side effects. You may report side effects to FDA at 1-800-FDA-1088. Where should I keep my medicine? This drug is given in a hospital or clinic and will not be stored at home. NOTE: This sheet is a summary. It may not cover all possible information. If you have questions about this medicine, talk to your doctor, pharmacist, or health care provider.  2018 Elsevier/Gold Standard (2007-06-11 14:40:54)   

## 2017-12-28 NOTE — Telephone Encounter (Signed)
Left message to return call to clinic a call back.

## 2017-12-28 NOTE — Progress Notes (Signed)
Met with patient and spouse to introduce myself as Arboriculturist and to provide my card for any additional financial questions or concerns.  Discussed the one-time $500 [place] to assist with gas cards, medications and other personal expenses while going through treatment. Provided the income guidelines for their household size. Spouse states they are over the income. Advised if anything changes ad they should fall below, to contact me and we can revisit. They verbalized understanding and have my card.

## 2017-12-28 NOTE — Telephone Encounter (Signed)
We can move appt earlier,so we can discuss pharmacologic options for anxiety and depression. Psychotherapy will also help.  Thanks, BJ

## 2017-12-31 ENCOUNTER — Other Ambulatory Visit: Payer: Self-pay | Admitting: Radiation Oncology

## 2017-12-31 ENCOUNTER — Encounter: Payer: Self-pay | Admitting: *Deleted

## 2017-12-31 ENCOUNTER — Ambulatory Visit
Admission: RE | Admit: 2017-12-31 | Discharge: 2017-12-31 | Disposition: A | Discharge status: Home / Self Care | Payer: BLUE CROSS/BLUE SHIELD | Source: Ambulatory Visit | Attending: Radiation Oncology | Admitting: Radiation Oncology

## 2017-12-31 DIAGNOSIS — C090 Malignant neoplasm of tonsillar fossa: Secondary | ICD-10-CM | POA: Diagnosis not present

## 2017-12-31 MED ORDER — LIDOCAINE VISCOUS HCL 2 % MT SOLN: OROMUCOSAL | 5 refills | Status: DC

## 2017-12-31 MED FILL — LIDOCAINE 2% VISCOUS SOLN: 2 | 3 days supply | Qty: 100 | Fill #0

## 2017-12-31 NOTE — Progress Notes (Unsigned)
Thomas Mccall was seen in the infusion room.  He reports that he has been having burping and abdominal bloating with gas recently.  He has a diagnosis of a malignant neoplasm of the tonsillar fossa and has a PEG tube in place.  He was given a GI cocktail while here today with improvement in his symptoms.  He was told to purchase over-the-counter simethicone, Gas-X, or Mylanta plus.  Sandi Mealy, MHS, PA-C Physician Assistant

## 2017-12-31 NOTE — Progress Notes (Signed)
Oncology Nurse Navigator Documentation  To provide support, encouragement and care continuity, met with Mr. Beagley during weekly PUT with Dr. Isidore Moos.  He completed 3 of 35 XRT today. He reported:  Conducting daily PEG dsg change and flush without difficulty.    Continuing to have abdominal discomfort at PEG insertion but less than last week.  Not taking analgesic.  Has not started Sonafine application.  Conducting SPL HEP twice daily.  Using fluoride toothpaste as prescribed.  Last BM this past Saturday.  More tired than usual/expected.  Has spoken with patient mentor on 2 occasions, has found conversations to be helpful. Navigator Interventions  Encouraged him to begin BID application of Sonafine to treatment area.  Educated on importance of regular BMs, encouraged daily OTC Miralax.  Encouraged regular exercise at some level to help with maintaining stamina during tmt.  Encouraged reaching out to Patient Mentor for ongoing support.  Provided updated appt calendar, noted specifically appts with Nutrition, emphasized importance of keeping these appts for nutritional support.  I encouraged him to contact me with needs/concerns.   Gayleen Orem, RN, BSN Head & Neck Oncology Nurse [place] at [place] [phone]

## 2017-12-31 NOTE — Telephone Encounter (Signed)
Spoke with Amy and gave instructions per Dr. [place]. She verbalized understanding and stated that she would encourage patient to contact us to seek treatment for the anxiety and depression.  No further assistance needed at this time.

## 2018-01-01 ENCOUNTER — Ambulatory Visit
Admission: RE | Admit: 2018-01-01 | Discharge: 2018-01-01 | Disposition: A | Discharge status: Home / Self Care | Payer: BLUE CROSS/BLUE SHIELD | Source: Ambulatory Visit | Attending: Radiation Oncology | Admitting: Radiation Oncology

## 2018-01-01 ENCOUNTER — Encounter: Payer: Self-pay | Admitting: Hematology

## 2018-01-01 DIAGNOSIS — C090 Malignant neoplasm of tonsillar fossa: Secondary | ICD-10-CM | POA: Diagnosis not present

## 2018-01-02 ENCOUNTER — Inpatient Hospital Stay: Payer: BLUE CROSS/BLUE SHIELD

## 2018-01-02 ENCOUNTER — Inpatient Hospital Stay: Payer: BLUE CROSS/BLUE SHIELD | Admitting: Nutrition

## 2018-01-02 ENCOUNTER — Ambulatory Visit
Admission: RE | Admit: 2018-01-02 | Discharge: 2018-01-02 | Disposition: A | Discharge status: Home / Self Care | Payer: BLUE CROSS/BLUE SHIELD | Source: Ambulatory Visit | Attending: Radiation Oncology | Admitting: Radiation Oncology

## 2018-01-02 ENCOUNTER — Encounter: Payer: Self-pay | Admitting: *Deleted

## 2018-01-02 DIAGNOSIS — Z95828 Presence of other vascular implants and grafts: Secondary | ICD-10-CM

## 2018-01-02 DIAGNOSIS — C090 Malignant neoplasm of tonsillar fossa: Secondary | ICD-10-CM | POA: Diagnosis not present

## 2018-01-02 LAB — CBC WITH DIFFERENTIAL (CANCER CENTER ONLY)
Abs Immature Granulocytes: 0.06 10*3/uL (ref 0.00–0.07)
Basophils Absolute: 0 10*3/uL (ref 0.0–0.1)
Basophils Relative: 0 %
Eosinophils Absolute: 0 10*3/uL (ref 0.0–0.5)
Eosinophils Relative: 0 %
HCT: 42.4 % (ref 39.0–52.0)
Hemoglobin: 14.4 g/dL (ref 13.0–17.0)
Immature Granulocytes: 1 %
Lymphocytes Relative: 3 %
Lymphs Abs: 0.2 10*3/uL — ABNORMAL LOW (ref 0.7–4.0)
MCH: 28.2 pg (ref 26.0–34.0)
MCHC: 34 g/dL (ref 30.0–36.0)
MCV: 83 fL (ref 80.0–100.0)
Monocytes Absolute: 0.3 10*3/uL (ref 0.1–1.0)
Monocytes Relative: 5 %
Neutro Abs: 5.9 10*3/uL (ref 1.7–7.7)
Neutrophils Relative %: 91 %
Platelet Count: 202 10*3/uL (ref 150–400)
RBC: 5.11 MIL/uL (ref 4.22–5.81)
RDW: 11.9 % (ref 11.5–15.5)
WBC Count: 6.5 10*3/uL (ref 4.0–10.5)
nRBC: 0 % (ref 0.0–0.2)

## 2018-01-02 LAB — BASIC METABOLIC PANEL - CANCER CENTER ONLY
Anion gap: 13 (ref 5–15)
BUN: 38 mg/dL — ABNORMAL HIGH (ref 6–20)
CO2: 28 mmol/L (ref 22–32)
Calcium: 10.1 mg/dL (ref 8.9–10.3)
Chloride: 98 mmol/L (ref 98–111)
Creatinine: 1.58 mg/dL — ABNORMAL HIGH (ref 0.61–1.24)
GFR, Est AFR Am: 54 mL/min — ABNORMAL LOW (ref >60)
GFR, Estimated: 47 mL/min — ABNORMAL LOW (ref >60)
Glucose, Bld: 138 mg/dL — ABNORMAL HIGH (ref 70–99)
Potassium: 3.8 mmol/L (ref 3.5–5.1)
Sodium: 139 mmol/L (ref 135–145)

## 2018-01-02 LAB — MAGNESIUM: Magnesium: 2.3 mg/dL (ref 1.7–2.4)

## 2018-01-02 MED ORDER — SODIUM CHLORIDE 0.9% FLUSH
10.0000 mL | INTRAVENOUS | Status: DC | PRN
  Administered 2018-01-02: 10 mL via INTRAVENOUS
  Filled 2018-01-02: qty 10

## 2018-01-02 MED ORDER — HEPARIN SOD (PORK) LOCK FLUSH 100 UNIT/ML IV SOLN
500.0000 [IU] | Freq: Once | INTRAVENOUS | Status: AC
  Administered 2018-01-02: 500 [IU] via INTRAVENOUS
  Filled 2018-01-02: qty 5

## 2018-01-02 NOTE — Progress Notes (Signed)
Nutrition follow-up completed with patient and his wife for right tonsil SCC, stage I. Patient is status post feeding tube placement and reports pain continues after placement. He reports increased gas and burping. He is complaining of food smells causing nausea.  Reports he is overly sensitive and his "safe place" to smells is his master bedroom. Patient reports he is trying to find an oral nutrition supplement he likes.  So far his preferred supplement is chocolate Ensure Enlive or chocolate Carnation breakfast essentials. Reports he is flushing his feeding tube with water daily and denies any difficulties or problems with his feeding tube. He has started taking stool softeners to ensure he avoids constipation.  Estimated nutrition needs: 2250-2450 cal, 95-115 g protein, 2.4 L fluid.  Nutrition diagnosis: Predicted suboptimal energy intake continues.  Intervention: Patient was educated on strategies for increasing calories and protein. Reviewed importance of patient trying to consume 3 Carnation breakfast essentials or 3 Ensure Enlive daily between meals. Recommended patient continue soft foods and educated him on strategies for helping with increased sense of smell. Encourage patient to control pain per MD instructions. Educated patient to continue bowel regimen as needed. Encouraged a minimum of 80 ounces of fluids daily. Teach back method used.  Monitoring, evaluation, goals: Patient will work to tolerate adequate calories and protein to minimize weight loss.  Next visit: Tuesday, October 22 before radiation therapy.  **Disclaimer: This note was dictated with voice recognition software. Similar sounding words can inadvertently be transcribed and this note may contain transcription errors which may not have been corrected upon publication of note.**

## 2018-01-03 ENCOUNTER — Encounter: Payer: Self-pay | Admitting: Hematology

## 2018-01-03 ENCOUNTER — Inpatient Hospital Stay: Payer: BLUE CROSS/BLUE SHIELD

## 2018-01-03 ENCOUNTER — Inpatient Hospital Stay (HOSPITAL_BASED_OUTPATIENT_CLINIC_OR_DEPARTMENT_OTHER): Payer: BLUE CROSS/BLUE SHIELD | Admitting: Hematology

## 2018-01-03 ENCOUNTER — Ambulatory Visit
Admission: RE | Admit: 2018-01-03 | Discharge: 2018-01-03 | Disposition: A | Discharge status: Home / Self Care | Payer: BLUE CROSS/BLUE SHIELD | Source: Ambulatory Visit | Attending: Radiation Oncology | Admitting: Radiation Oncology

## 2018-01-03 ENCOUNTER — Other Ambulatory Visit: Payer: Self-pay

## 2018-01-03 VITALS — BP 132/75 | HR 72 | Temp 97.7°F | Resp 18 | Wt 165.2 lb

## 2018-01-03 DIAGNOSIS — N179 Acute kidney failure, unspecified: Secondary | ICD-10-CM

## 2018-01-03 DIAGNOSIS — Z931 Gastrostomy status: Secondary | ICD-10-CM

## 2018-01-03 DIAGNOSIS — T451X5S Adverse effect of antineoplastic and immunosuppressive drugs, sequela: Secondary | ICD-10-CM

## 2018-01-03 DIAGNOSIS — Z79899 Other long term (current) drug therapy: Secondary | ICD-10-CM

## 2018-01-03 DIAGNOSIS — R140 Abdominal distension (gaseous): Secondary | ICD-10-CM

## 2018-01-03 DIAGNOSIS — R5383 Other fatigue: Secondary | ICD-10-CM

## 2018-01-03 DIAGNOSIS — C770 Secondary and unspecified malignant neoplasm of lymph nodes of head, face and neck: Secondary | ICD-10-CM | POA: Diagnosis not present

## 2018-01-03 DIAGNOSIS — C090 Malignant neoplasm of tonsillar fossa: Secondary | ICD-10-CM

## 2018-01-03 DIAGNOSIS — G4709 Other insomnia: Secondary | ICD-10-CM

## 2018-01-03 DIAGNOSIS — F419 Anxiety disorder, unspecified: Secondary | ICD-10-CM

## 2018-01-03 DIAGNOSIS — R6881 Early satiety: Secondary | ICD-10-CM

## 2018-01-03 DIAGNOSIS — E860 Dehydration: Secondary | ICD-10-CM

## 2018-01-03 DIAGNOSIS — M542 Cervicalgia: Secondary | ICD-10-CM

## 2018-01-03 DIAGNOSIS — K1231 Oral mucositis (ulcerative) due to antineoplastic therapy: Secondary | ICD-10-CM

## 2018-01-03 DIAGNOSIS — E440 Moderate protein-calorie malnutrition: Secondary | ICD-10-CM

## 2018-01-03 DIAGNOSIS — R634 Abnormal weight loss: Secondary | ICD-10-CM

## 2018-01-03 MED ORDER — SODIUM CHLORIDE 0.9 % IV SOLN
INTRAVENOUS | Status: DC
  Administered 2018-01-03: 14:00:00 via INTRAVENOUS
  Filled 2018-01-03 (×2): qty 250

## 2018-01-03 NOTE — Patient Instructions (Signed)
Dehydration, Adult Dehydration is a condition in which there is not enough fluid or water in the body. This happens when you lose more fluids than you take in. Important organs, such as the kidneys, brain, and heart, cannot function without a proper amount of fluids. Any loss of fluids from the body can lead to dehydration. Dehydration can range from mild to severe. This condition should be treated right away to prevent it from becoming severe. What are the causes? This condition may be caused by:  Vomiting.  Diarrhea.  Excessive sweating, such as from heat exposure or exercise.  Not drinking enough fluid, especially: ? When ill. ? While doing activity that requires a lot of energy.  Excessive urination.  Fever.  Infection.  Certain medicines, such as medicines that cause the body to lose excess fluid (diuretics).  Inability to access safe drinking water.  Reduced physical ability to get adequate water and food.  What increases the risk? This condition is more likely to develop in people:  Who have a poorly controlled long-term (chronic) illness, such as diabetes, heart disease, or kidney disease.  Who are age 65 or older.  Who are disabled.  Who live in a place with high altitude.  Who play endurance sports.  What are the signs or symptoms? Symptoms of mild dehydration may include:  Thirst.  Dry lips.  Slightly dry mouth.  Dry, warm skin.  Dizziness. Symptoms of moderate dehydration may include:  Very dry mouth.  Muscle cramps.  Dark urine. Urine may be the color of tea.  Decreased urine production.  Decreased tear production.  Heartbeat that is irregular or faster than normal (palpitations).  Headache.  Light-headedness, especially when you stand up from a sitting position.  Fainting (syncope). Symptoms of severe dehydration may include:  Changes in skin, such as: ? Cold and clammy skin. ? Blotchy (mottled) or pale skin. ? Skin that does  not quickly return to normal after being lightly pinched and released (poor skin turgor).  Changes in body fluids, such as: ? Extreme thirst. ? No tear production. ? Inability to sweat when body temperature is high, such as in hot weather. ? Very little urine production.  Changes in vital signs, such as: ? Weak pulse. ? Pulse that is more than 100 beats a minute when sitting still. ? Rapid breathing. ? Low blood pressure.  Other changes, such as: ? Sunken eyes. ? Cold hands and feet. ? Confusion. ? Lack of energy (lethargy). ? Difficulty waking up from sleep. ? Short-term weight loss. ? Unconsciousness. How is this diagnosed? This condition is diagnosed based on your symptoms and a physical exam. Blood and urine tests may be done to help confirm the diagnosis. How is this treated? Treatment for this condition depends on the severity. Mild or moderate dehydration can often be treated at home. Treatment should be started right away. Do not wait until dehydration becomes severe. Severe dehydration is an emergency and it needs to be treated in a hospital. Treatment for mild dehydration may include:  Drinking more fluids.  Replacing salts and minerals in your blood (electrolytes) that you may have lost. Treatment for moderate dehydration may include:  Drinking an oral rehydration solution (ORS). This is a drink that helps you replace fluids and electrolytes (rehydrate). It can be found at pharmacies and retail stores. Treatment for severe dehydration may include:  Receiving fluids through an IV tube.  Receiving an electrolyte solution through a feeding tube that is passed through your nose   and into your stomach (nasogastric tube, or NG tube).  Correcting any abnormalities in electrolytes.  Treating the underlying cause of dehydration. Follow these instructions at home:  If directed by your health care provider, drink an ORS: ? Make an ORS by following instructions on the  package. ? Start by drinking small amounts, about  cup (120 mL) every 5-10 minutes. ? Slowly increase how much you drink until you have taken the amount recommended by your health care provider.  Drink enough clear fluid to keep your urine clear or pale yellow. If you were told to drink an ORS, finish the ORS first, then start slowly drinking other clear fluids. Drink fluids such as: ? Water. Do not drink only water. Doing that can lead to having too little salt (sodium) in the body (hyponatremia). ? Ice chips. ? Fruit juice that you have added water to (diluted fruit juice). ? Low-calorie sports drinks.  Avoid: ? Alcohol. ? Drinks that contain a lot of sugar. These include high-calorie sports drinks, fruit juice that is not diluted, and soda. ? Caffeine. ? Foods that are greasy or contain a lot of fat or sugar.  Take over-the-counter and prescription medicines only as told by your health care provider.  Do not take sodium tablets. This can lead to having too much sodium in the body (hypernatremia).  Eat foods that contain a healthy balance of electrolytes, such as bananas, oranges, potatoes, tomatoes, and spinach.  Keep all follow-up visits as told by your health care provider. This is important. Contact a health care provider if:  You have abdominal pain that: ? Gets worse. ? Stays in one area (localizes).  You have a rash.  You have a stiff neck.  You are more irritable than usual.  You are sleepier or more difficult to wake up than usual.  You feel weak or dizzy.  You feel very thirsty.  You have urinated only a small amount of very dark urine over 6-8 hours. Get help right away if:  You have symptoms of severe dehydration.  You cannot drink fluids without vomiting.  Your symptoms get worse with treatment.  You have a fever.  You have a severe headache.  You have vomiting or diarrhea that: ? Gets worse. ? Does not go away.  You have blood or green matter  (bile) in your vomit.  You have blood in your stool. This may cause stool to look black and tarry.  You have not urinated in 6-8 hours.  You faint.  Your heart rate while sitting still is over 100 beats a minute.  You have trouble breathing. This information is not intended to replace advice given to you by your health care provider. Make sure you discuss any questions you have with your health care provider. Document Released: 03/06/2005 Document Revised: 10/01/2015 Document Reviewed: 04/30/2015 Elsevier Interactive Patient Education  2018 Elsevier Inc.  

## 2018-01-03 NOTE — Progress Notes (Signed)
[place] OFFICE PROGRESS NOTE  Patient Care Team: Thomas Mccall, Thomas G, MD as PCP - General (Family Medicine) Thomas Gibson, MD as Attending Physician (Radiation Oncology) Tish Men, MD as Consulting Physician (Hematology) Thomas Sauers, RN as Oncology Nurse Navigator (Oncology) Thomas Mccall, RD as Dietitian (Nutrition) Thomas Mccall, CCC-SLP as Speech Language Pathologist (Speech Pathology) Thomas Mccall, Thomas Mccall, Thomas Mccall as Physical Therapist (Physical Therapy)  HEME/ONC OVERVIEW: 1. 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, p16+ -For detailed oncologic hx, see below -Concurrent chemoRT started on 12/28/2017 -Next chemo treatment on 01/18/2018  -Pending the kidney function (see below), we will determine if he will require dose reduction for the 2nd dose of chemotherapy  Acute kidney injury -Cr 1.6 today, up from 0.9 one week ago -Secondary to dehydration and cisplatin-related nephrotoxicity -Patient is not able to maintain adequate hydration, which then impacts his ability to maintain enteral nutrition due early satiety and bloating from water intake -Given the worsening renal function, I have placed order for home health to initiate home IV fluid infusion (1L NS per day, M-F up to 12 weeks)  -Weekly renal function monitoring   Malnutrition -The patient has lost 10 lbs since mid-September 2019 -He has been unable to maintain adequate tube feeding due to early satiety from trying to maintain hydration -As discussed above, we will try to get patient home IV fluid so that he can use his feeding tube for enteral nutrition  Treatment-Related Mucositis -Modest at this time -He has PRN morphine liquid for  mucositis    Orders Placed This Encounter  Procedures  . Ambulatory referral to Home Health    Referral Priority:   Routine    Referral Type:   Home Health Care    Referral Reason:   Specialty Services Required    Requested Specialty:   [place]    Number of Visits Requested:   1   All questions were answered. The patient knows to call the clinic with any problems, questions or concerns. No barriers to learning was detected.  A total of more than 25 minutes were spent face-to-face with the patient during this encounter and over half of that time was spent on counseling and coordination of care as outlined above.   Return to clinic on 01/17/2018 for labs and follow-up prior to the 2nd dose of chemotherapy on 01/18/2018.   Tish Men, MD 01/03/2018 3:24 PM  CHIEF COMPLAINT: "I am here for fluids"  INTERVAL HISTORY: Thomas Mccall returns to clinic for labs and clinic follow-up.  [name].  He reports that he had one episode of diarrhea 2 days ago, but has not had any diarrhea since then.  He reports moderate fatigue and 10 pound weight loss since mid September 2019.  He is taking in 2 to 3 cans of Carnation per day, in addition to some food intake. Unfortunately, his appetite is less, and he has been unable to put more tube feeds through his feeding tube due to early satiety and bloating from trying to maintain oral fluid intake.  He denies any fever, chill, night sweats, odynophagia, dysphagia, chest pain, or dyspnea.  SUMMARY OF ONCOLOGIC HISTORY:   Malignant  neoplasm of tonsillar fossa ([place])   10/30/2017 Miscellaneous    Presented to Dr. Redmond Baseman with 3 months of R neck mass    11/07/2017 Imaging    CT neck w/ contrast:  1. Right tonsillar mass measures up to 2.6 cm, highly concerning for a squamous cell carcinoma. 2. Enlarged homogeneous right level 2 lymph node measures up to 3.9 cm, consistent with metastatic disease. 3. Adjacent right level 3 lymph node  measures 1.1 cm. 4. Lucency at C4 on the left is likely degenerative. Metastatic disease is not excluded. Please correlate with findings on PET scan if used for further evaluation.    11/12/2017 Procedure    R tonsillar biopsy by Dr. Janace Hoard    11/12/2017 Pathology Results    Accession: [id number] Invasive squamous cell carcinoma; p16 diffusely positive    11/28/2017 Imaging    PET scan: 1. Right tonsillar fossa mass is intensely hypermetabolic compatible with primary neoplasm. 2. Solitary enlarged and hypermetabolic right level 2 cervical lymph node compatible with metastatic adenopathy. 3. No evidence for FDG avid hypermetabolic distant metastasis.    11/30/2017 Cancer Staging    Staging form: Pharynx - HPV-Mediated Oropharynx, AJCC 8th Edition - Clinical stage from 11/30/2017: Stage I (cT2, cN1, cM0, p16+) - Signed by Thomas Gibson, MD on 11/30/2017    12/07/2017 Miscellaneous    ENT evaluation to [facility] recommended against surgical resection due to the tumor extending into the right inferior nasopharynx and posterior soft palate, which would require resection of almost 1/2 of the soft palate and the right nasopharyngeal wall extending into right posterior/lateral posterior wall.     Procedure    12/26/2017: port and PEG tube placement     12/12/2017 -  Chemotherapy    The patient had palonosetron (ALOXI) injection 0.25 mg, 0.25 mg, Intravenous,  Once, 1 of 3 cycles Administration: 0.25 mg (12/28/2017) CISplatin (PLATINOL) 196 mg in sodium chloride 0.9 % 500 mL chemo infusion, 100 mg/m2 = 196 mg, Intravenous,  Once, 1 of 3 cycles Administration: 196 mg (12/28/2017) fosaprepitant (EMEND) 150 mg, dexamethasone (DECADRON) 12 mg in sodium chloride 0.9 % 145 mL IVPB, , Intravenous,  Once, 1 of 3 cycles Administration:  (12/28/2017)  for chemotherapy treatment.      REVIEW OF SYSTEMS:   Constitutional: ( - ) fevers, ( - )  chills , ( - ) night sweats, ( + ) weight loss Eyes: ( - )  blurriness of vision, ( - ) double vision, ( - ) watery eyes Ears, nose, mouth, throat, and face: ( - ) mucositis, ( - ) sore throat Respiratory: ( - ) cough, ( - ) dyspnea, ( - ) wheezes Cardiovascular: ( - ) palpitation, ( - ) chest discomfort, ( - ) lower extremity swelling Gastrointestinal:  ( - ) nausea, ( - ) heartburn Skin: ( - ) abnormal skin rashes Lymphatics: ( - ) new lymphadenopathy, ( - ) easy bruising Neurological: ( - ) numbness, ( - ) tingling, ( + ) generalized weaknesses Behavioral/Psych: ( - ) mood change, ( - ) new changes  All other systems were reviewed with the patient and are negative.  I have reviewed the past medical history, past surgical history, social history and family history with the patient and they are unchanged from previous note.  ALLERGIES:  has No Known Allergies.  MEDICATIONS:  Current Outpatient Medications  Medication Sig Dispense Refill  . dexamethasone (DECADRON) 4 MG tablet Take 2 tablets by mouth once a day on the  day after chemotherapy and then take 2 tablets two times a day for 2 days. Take with food. 30 tablet 1  . lidocaine (XYLOCAINE) 2 % solution Patient: Mix 1part 2% viscous lidocaine, 1part H20. Swish & swallow 95mL of diluted mixture, 41min before meals and at bedtime, up to QID 100 mL 5  . lidocaine-prilocaine (EMLA) cream Apply to affected area once 30 Mccall 3  . LORazepam (ATIVAN) 0.5 MG tablet Take 1 tablet (0.5 mg total) by mouth every 6 (six) hours as needed (Nausea or vomiting). 30 tablet 0  . Morphine Sulfate (MORPHINE CONCENTRATE) 10 mg / 0.5 ml concentrated solution Take 0.5 mLs (10 mg total) by mouth every 3 (three) hours as needed for severe pain. 120 mL 0  . ondansetron (ZOFRAN) 8 MG tablet Take 1 tablet (8 mg total) by mouth 2 (two) times daily as needed. Start on the third day after chemotherapy. 30 tablet 1  . prochlorperazine (COMPAZINE) 10 MG tablet Take 1 tablet (10 mg total) by mouth every 6 (six) hours as needed (Nausea  or vomiting). 30 tablet 1  . sodium fluoride (PREVIDENT 5000 PLUS) 1.1 % CREA dental cream Apply cream to tooth brush. Brush teeth for 2 minutes. Spit out excess. DO NOT rinse afterwards. Repeat nightly. 1 Tube prn   No current facility-administered medications for this visit.    Facility-Administered Medications Ordered in Other Visits  Medication Dose Route Frequency Provider Last Rate Last Dose  . 0.9 %  sodium chloride infusion   Intravenous Continuous Tish Men, MD 500 mL/hr at 01/03/18 1415      PHYSICAL EXAMINATION: ECOG PERFORMANCE STATUS: 2 - Symptomatic, <50% confined to bed  Today's Vitals   01/03/18 1419 01/03/18 1420  BP: 132/75   Pulse: 72   Resp: 18   Temp: 97.7 F (36.5 C)   TempSrc: Oral   SpO2: 99%   Weight: 165 lb 4 oz (75 kg)   PainSc:  0-No pain   Body mass index is 23.71 kg/m.  Filed Weights   01/03/18 1419  Weight: 165 lb 4 oz (75 kg)    GENERAL: alert, no distress and comfortable, slightly frail-appearing SKIN: skin color, texture, turgor are normal, no rashes or significant lesions EYES: conjunctiva are pink and non-injected, sclera clear OROPHARYNX: no exudate, no erythema; lips, buccal mucosa, and tongue normal  NECK: ~3x3cm R cervical LN, mobile, non-tender LYMPH:  no other palpable lymphadenopathy in the cervical or axillary  LUNGS: clear to auscultation and percussion with normal breathing effort HEART: regular rate & rhythm and no murmurs and no lower extremity edema ABDOMEN: soft, non-tender, non-distended, normal bowel sounds Musculoskeletal: no cyanosis of digits and no clubbing  PSYCH: alert & oriented x 3, fluent speech NEURO: no focal motor/sensory deficits  LABORATORY DATA:  I have reviewed the data as listed    Component Value Date/Time   NA 139 01/02/2018 1032   K 3.8 01/02/2018 1032   CL 98 01/02/2018 1032   CO2 28 01/02/2018 1032   GLUCOSE 138 (H) 01/02/2018 1032   BUN 38 (H) 01/02/2018 1032   CREATININE 1.58 (H)  01/02/2018 1032   CALCIUM 10.1 01/02/2018 1032   PROT 7.3 12/12/2017 1426   ALBUMIN 4.2 12/12/2017 1426   AST 13 (L) 12/12/2017 1426   ALT 20 12/12/2017 1426   ALKPHOS 75 12/12/2017 1426   BILITOT 0.5 12/12/2017 1426   GFRNONAA 47 (L) 01/02/2018 1032   GFRAA 54 (L) 01/02/2018 1032    No results found for:  SPEP, UPEP  Lab Results  Component Value Date   WBC 6.5 01/02/2018   NEUTROABS 5.9 01/02/2018   HGB 14.4 01/02/2018   HCT 42.4 01/02/2018   MCV 83.0 01/02/2018   PLT 202 01/02/2018      Chemistry      Component Value Date/Time   NA 139 01/02/2018 1032   K 3.8 01/02/2018 1032   CL 98 01/02/2018 1032   CO2 28 01/02/2018 1032   BUN 38 (H) 01/02/2018 1032   CREATININE 1.58 (H) 01/02/2018 1032      Component Value Date/Time   CALCIUM 10.1 01/02/2018 1032   ALKPHOS 75 12/12/2017 1426   AST 13 (L) 12/12/2017 1426   ALT 20 12/12/2017 1426   BILITOT 0.5 12/12/2017 1426

## 2018-01-04 ENCOUNTER — Ambulatory Visit
Admission: RE | Admit: 2018-01-04 | Discharge: 2018-01-04 | Disposition: A | Discharge status: Home / Self Care | Payer: BLUE CROSS/BLUE SHIELD | Source: Ambulatory Visit | Attending: Radiation Oncology | Admitting: Radiation Oncology

## 2018-01-04 DIAGNOSIS — C090 Malignant neoplasm of tonsillar fossa: Secondary | ICD-10-CM | POA: Diagnosis not present

## 2018-01-07 ENCOUNTER — Ambulatory Visit
Admission: RE | Admit: 2018-01-07 | Discharge: 2018-01-07 | Disposition: A | Discharge status: Home / Self Care | Payer: BLUE CROSS/BLUE SHIELD | Source: Ambulatory Visit | Attending: Radiation Oncology | Admitting: Radiation Oncology

## 2018-01-07 ENCOUNTER — Ambulatory Visit: Payer: BLUE CROSS/BLUE SHIELD | Attending: Radiation Oncology

## 2018-01-07 ENCOUNTER — Telehealth: Payer: Self-pay | Admitting: *Deleted

## 2018-01-07 ENCOUNTER — Other Ambulatory Visit: Payer: Self-pay | Admitting: Radiation Oncology

## 2018-01-07 ENCOUNTER — Inpatient Hospital Stay: Payer: BLUE CROSS/BLUE SHIELD

## 2018-01-07 DIAGNOSIS — R1310 Dysphagia, unspecified: Secondary | ICD-10-CM | POA: Diagnosis not present

## 2018-01-07 DIAGNOSIS — C090 Malignant neoplasm of tonsillar fossa: Secondary | ICD-10-CM | POA: Diagnosis not present

## 2018-01-07 NOTE — Telephone Encounter (Signed)
Oncology Nurse Navigator Documentation  Rec'd call from Mr. Thompson, confirmed his 1:15 SLP at [facility], 3:15 XRT followed by [facility] with Dr. Isidore Moos.  Gayleen Orem, RN, BSN Head & Neck Oncology Nurse [place] at [place] [phone]

## 2018-01-07 NOTE — Therapy (Signed)
[facility] [address] [place], [place], [address] Phone: [phone]   Fax:  [phone]  Speech Language Pathology Treatment  Patient Details  Name: Thomas Mccall MRN: [id number] Date of Birth: [birth year] Referring Provider (SLP): Eppie Gibson, MD   Encounter Date: 01/07/2018  End of Session - 01/07/18 1413    Visit Number  2    Number of Visits  7    Date for SLP Re-Evaluation  06/11/18    SLP Start Time  1320    SLP Stop Time   1355    SLP Time Calculation (min)  35 min    Activity Tolerance  Patient tolerated treatment well       Past Medical History:  Diagnosis Date  . Headache   . Mass    right tonsil  . Wears glasses     Past Surgical History:  Procedure Laterality Date  . DIRECT LARYNGOSCOPY Right 11/12/2017   Procedure: DIRECT LARYNGOSCOPY;  Surgeon: Melissa Montane, MD;  Location: [facility];  Service: ENT;  Laterality: Right;  Direct laryngoscopy and biopy of right tonsil mass  . IR GASTROSTOMY TUBE MOD SED  12/26/2017  . IR IMAGING GUIDED PORT INSERTION  12/26/2017  . TONSILLECTOMY AND ADENOIDECTOMY  1970  . WISDOM TOOTH EXTRACTION      There were no vitals filed for this visit.  Subjective Assessment - 01/07/18 1400    Subjective  Pt walking gingerly into     Currently in Pain?  No/denies    Pain Location  Abdomen    Pain Orientation  Left;Upper    Pain Descriptors / Indicators  Pressure    Pain Type  Surgical pain            ADULT SLP TREATMENT - 01/07/18 1406      General Information   Behavior/Cognition  Alert;Cooperative   flat affect     Treatment Provided   Treatment provided  Dysphagia      Dysphagia Treatment   Temperature Spikes Noted  No    Respiratory Status  Room air    Oral Cavity - Dentition  Adequate natural dentition   missing two molars on rt and one on lt   Treatment Methods  Skilled observation;Therapeutic exercise;Patient/caregiver education    Patient observed directly with PO's  Yes    Type of PO's observed  Dysphagia 1 (puree);Thin liquids    Oral Phase Signs & Symptoms  --   none noted   Pharyngeal Phase Signs & Symptoms  --   none noted   Other treatment/comments  Pt req'd rare min A with HEP ([place]), and demonstrated WNL/WFL swallowing with puree and thin. Reports rare mild cough with thin liquids. SLP educated pt re: rationale for HEP, re-educated re: atrophy of swallow muculature and why the need exists for POs if pt is PEG dependent. SLP provided information on towel hold as alternative to [place] due to surgical pain from PEG.       Dysphagia Recommendations   Diet recommendations  --   as tolerated   Liquids provided via  Cup    Medication Administration  Whole meds with liquid      Progression Toward Goals   Progression toward goals  Progressing toward goals       SLP Education - 01/07/18 1411    Education Details  late effects head/neck radiation on swallowing, muscle atrophy, towel hold exercise    Person(s) Educated  Patient;Spouse    Methods  Explanation;Demonstration;Handout  Comprehension  Verbalized understanding;Returned demonstration       SLP Short Term Goals - 01/07/18 1409      SLP SHORT TERM GOAL #1   Title  pt will complete HEP with rare min A over two sessions    Baseline  01-07-18    Time  2    Period  --   visits   Status  On-going      SLP SHORT TERM GOAL #2   Title  pt will tell SLP why he is completing HEP     Time  2    Period  --   visits   Status  On-going      SLP SHORT TERM GOAL #3   Title  pt will tell SLP how a food journal can foster a quicker return to a more-normalized diet    Time  3    Period  --   visits   Status  On-going       SLP Long Term Goals - 01/07/18 1409      SLP LONG TERM GOAL #1   Title  pt will tell SLP 3 overt s/s of aspiration PNA with modified independence    Time  4    Period  --   visits   Status  On-going      SLP LONG TERM GOAL  #2   Title  pt will complete HEP with modified independence over two sessions    Time  4    Period  --   visits   Status  On-going      SLP LONG TERM GOAL #3   Title  pt will tell SLP how to access information about life post-head/neck radiation with modified independence (classes, online info, etc)    Time  6    Period  --   visits   Status  On-going       Plan - 01/07/18 1413    Clinical Impression Statement  Pt complains of taste alterations this date. "Everything tastes like sawdust." Pt with oropharyngeal swallowing continuing as essentially WNL, however the probability of swallowing difficulty increases dramatically with the initiation of chemo and radiation therapy. Pt will need to be followed by SLP for regular assessment of accurate HEP completion as well as for safety with POs both during and following treatment/s.    Speech Therapy Frequency  --   approx once every 4 weeks   Duration  --   7 total visits   Treatment/Interventions  Aspiration precaution training;Pharyngeal strengthening exercises;Diet toleration management by SLP;Trials of upgraded texture/liquids;Internal/external aids;Patient/family education;Compensatory strategies;SLP instruction and feedback;Cueing hierarchy;Environmental controls    Potential to Achieve Goals  Good    Potential Considerations  --   anxiety(?)   SLP Home Exercise Plan  provided today    Consulted and Agree with Plan of Care  Patient       Patient will benefit from skilled therapeutic intervention in order to improve the following deficits and impairments:   Dysphagia, unspecified type    Problem List Patient Active Problem List   Diagnosis Date Noted  . Malignant neoplasm of tonsillar fossa ([place]) 11/30/2017    Shamere Dilworth ,[place], CCC-SLP  01/07/2018, 2:14 PM  [facility] [address] [place], [place], [address] Phone: [phone]   Fax:  [phone]   Name:  Thomas Mccall MRN: [id number] Date of Birth: Sep 20, 1960

## 2018-01-07 NOTE — Patient Instructions (Signed)
TOWEL HOLD EXERCISE  Roll a towel to approx 4" diameter and place under chin Squeeze towel tightly and hold for 45-60 seconds, 3 times, 2-3 times a day

## 2018-01-08 ENCOUNTER — Ambulatory Visit
Admission: RE | Admit: 2018-01-08 | Discharge: 2018-01-08 | Disposition: A | Discharge status: Home / Self Care | Payer: BLUE CROSS/BLUE SHIELD | Source: Ambulatory Visit | Attending: Radiation Oncology | Admitting: Radiation Oncology

## 2018-01-08 ENCOUNTER — Encounter: Payer: Self-pay | Admitting: Nutrition

## 2018-01-08 ENCOUNTER — Telehealth: Payer: Self-pay | Admitting: *Deleted

## 2018-01-08 ENCOUNTER — Encounter: Payer: Self-pay | Admitting: *Deleted

## 2018-01-08 ENCOUNTER — Inpatient Hospital Stay: Payer: BLUE CROSS/BLUE SHIELD | Admitting: Nutrition

## 2018-01-08 DIAGNOSIS — C090 Malignant neoplasm of tonsillar fossa: Secondary | ICD-10-CM | POA: Diagnosis not present

## 2018-01-08 NOTE — Progress Notes (Signed)
Patient cancelled nutrition appointment. 

## 2018-01-08 NOTE — Telephone Encounter (Signed)
Received call from [place] with [facility].  She stated that the patient states he is not supposed to get IV hydration every day Monday thru Friday.  Per Dr Lorette Ang last note, did confirm that he did order it as daily M-F.    [facility] Nurse requested that we call the patient to clarify so that their will not be any misunderstanding.  Called, Left message for returned call to clarify orders.  Dr Maylon Peppers notified.

## 2018-01-09 ENCOUNTER — Inpatient Hospital Stay: Payer: BLUE CROSS/BLUE SHIELD

## 2018-01-09 ENCOUNTER — Telehealth: Payer: Self-pay | Admitting: *Deleted

## 2018-01-09 ENCOUNTER — Ambulatory Visit
Admission: RE | Admit: 2018-01-09 | Discharge: 2018-01-09 | Disposition: A | Discharge status: Home / Self Care | Payer: BLUE CROSS/BLUE SHIELD | Source: Ambulatory Visit | Attending: Radiation Oncology | Admitting: Radiation Oncology

## 2018-01-09 ENCOUNTER — Inpatient Hospital Stay: Payer: BLUE CROSS/BLUE SHIELD | Admitting: Nutrition

## 2018-01-09 DIAGNOSIS — C090 Malignant neoplasm of tonsillar fossa: Secondary | ICD-10-CM | POA: Diagnosis not present

## 2018-01-09 DIAGNOSIS — Z95828 Presence of other vascular implants and grafts: Secondary | ICD-10-CM | POA: Insufficient documentation

## 2018-01-09 LAB — CBC WITH DIFFERENTIAL (CANCER CENTER ONLY)
Abs Immature Granulocytes: 0.01 10*3/uL (ref 0.00–0.07)
Basophils Absolute: 0 10*3/uL (ref 0.0–0.1)
Basophils Relative: 0 %
Eosinophils Absolute: 0.1 10*3/uL (ref 0.0–0.5)
Eosinophils Relative: 1 %
HCT: 39.8 % (ref 39.0–52.0)
Hemoglobin: 13.2 g/dL (ref 13.0–17.0)
Immature Granulocytes: 0 %
Lymphocytes Relative: 9 %
Lymphs Abs: 0.4 10*3/uL — ABNORMAL LOW (ref 0.7–4.0)
MCH: 28 pg (ref 26.0–34.0)
MCHC: 33.2 g/dL (ref 30.0–36.0)
MCV: 84.5 fL (ref 80.0–100.0)
Monocytes Absolute: 0.3 10*3/uL (ref 0.1–1.0)
Monocytes Relative: 6 %
Neutro Abs: 4.1 10*3/uL (ref 1.7–7.7)
Neutrophils Relative %: 84 %
Platelet Count: 97 10*3/uL — ABNORMAL LOW (ref 150–400)
RBC: 4.71 MIL/uL (ref 4.22–5.81)
RDW: 11.9 % (ref 11.5–15.5)
WBC Count: 4.9 10*3/uL (ref 4.0–10.5)
nRBC: 0 % (ref 0.0–0.2)

## 2018-01-09 LAB — BASIC METABOLIC PANEL - CANCER CENTER ONLY
Anion gap: 10 (ref 5–15)
BUN: 16 mg/dL (ref 6–20)
CO2: 26 mmol/L (ref 22–32)
Calcium: 9.8 mg/dL (ref 8.9–10.3)
Chloride: 102 mmol/L (ref 98–111)
Creatinine: 1.22 mg/dL (ref 0.61–1.24)
GFR, Est AFR Am: >60 mL/min (ref >60)
GFR, Estimated: >60 mL/min (ref >60)
Glucose, Bld: 130 mg/dL — ABNORMAL HIGH (ref 70–99)
Potassium: 4.1 mmol/L (ref 3.5–5.1)
Sodium: 138 mmol/L (ref 135–145)

## 2018-01-09 LAB — MAGNESIUM: Magnesium: 2.1 mg/dL (ref 1.7–2.4)

## 2018-01-09 MED ORDER — SODIUM CHLORIDE 0.9% FLUSH
10.0000 mL | INTRAVENOUS | Status: DC | PRN
  Administered 2018-01-09: 10 mL
  Filled 2018-01-09: qty 10

## 2018-01-09 MED ORDER — OSMOLITE 1.5 CAL PO LIQD: ORAL | 0 refills | Status: DC

## 2018-01-09 MED ORDER — HEPARIN SOD (PORK) LOCK FLUSH 100 UNIT/ML IV SOLN
500.0000 [IU] | Freq: Once | INTRAVENOUS | Status: AC | PRN
  Administered 2018-01-09: 500 [IU]
  Filled 2018-01-09: qty 5

## 2018-01-09 NOTE — Telephone Encounter (Signed)
Oncology Nurse Navigator Documentation  In follow-up to call from [place], called pt to inform him of rescheduled Nutrition appt after today's RT, that I would be joining him.  He voiced understanding.  Gayleen Orem, RN, BSN Head & Neck Oncology Nurse [place] at [place] [phone]

## 2018-01-09 NOTE — Progress Notes (Signed)
Nutrition follow-up completed with patient after radiation therapy for right tonsil cancer. Patient reports all food tastes like sawdust. He currently denies pain of any kind. Reports he is able to swallow however the food tastes so bad he cannot tolerate it. He is drinking water by mouth. Current weight up slightly and was documented as 166.2 pounds October 23 increased from 165 pounds October 17. Patient is receiving 1 L IV fluids 5 days per week.  Patient reports he is starting to have constipation and is agreeable to adding MiraLAX.  Estimated nutrition needs: 2250-2450 cal, 95-115 g protein, 2.4 L fluid.  Nutrition diagnosis: Predicted suboptimal energy intake has evolved into inadequate oral intake related to right tonsil cancer as evidenced by patient's recall of oral intake consumed.  Intervention: Patient was educated to begin 1-1/2 bottles of Osmolite 1.5 via PEG 4 times daily with a 60 mL water flush before and after bolus feeding. Give 30 mL ProMod via PEG mixed with 60 mL water via PEG and flush after with 30 mL free water. Patient educated not to mix Osmolite 1.5 with ProMod. Written instructions were provided.  Questions were answered.  Teach back method used. Patient also educated to continue oral intake as tolerated. Enforced importance of patient continuing swallow exercises.  Tube feeding plus ProMod plus free water flushes provides 2300 cal, 109 g protein, 1746 mL free water. (Free water flushes plus IV fluids providing 2746 mL daily)  Monitoring, evaluation, goals: Patient will tolerate tube feeding at goal rate to minimize weight loss.  Next visit: Thursday, October 31.  **Disclaimer: This note was dictated with voice recognition software. Similar sounding words can inadvertently be transcribed and this note may contain transcription errors which may not have been corrected upon publication of note.**

## 2018-01-10 ENCOUNTER — Ambulatory Visit
Admission: RE | Admit: 2018-01-10 | Discharge: 2018-01-10 | Disposition: A | Discharge status: Home / Self Care | Payer: BLUE CROSS/BLUE SHIELD | Source: Ambulatory Visit | Attending: Radiation Oncology | Admitting: Radiation Oncology

## 2018-01-10 DIAGNOSIS — C090 Malignant neoplasm of tonsillar fossa: Secondary | ICD-10-CM | POA: Diagnosis not present

## 2018-01-11 ENCOUNTER — Ambulatory Visit
Admission: RE | Admit: 2018-01-11 | Discharge: 2018-01-11 | Disposition: A | Discharge status: Home / Self Care | Payer: BLUE CROSS/BLUE SHIELD | Source: Ambulatory Visit | Attending: Radiation Oncology | Admitting: Radiation Oncology

## 2018-01-11 ENCOUNTER — Encounter: Payer: Self-pay | Admitting: Hematology

## 2018-01-11 DIAGNOSIS — C090 Malignant neoplasm of tonsillar fossa: Secondary | ICD-10-CM | POA: Diagnosis not present

## 2018-01-11 NOTE — Progress Notes (Signed)
Oncology Nurse Navigator Documentation  To provide support, encouragement and care continuity, met with [name] after RT and during Nutrition appt with Dory [place].  Gayleen Orem, RN, BSN Head & Neck Oncology Nurse [place] at [place] [phone]

## 2018-01-14 ENCOUNTER — Ambulatory Visit
Admission: RE | Admit: 2018-01-14 | Discharge: 2018-01-14 | Disposition: A | Discharge status: Home / Self Care | Payer: BLUE CROSS/BLUE SHIELD | Source: Ambulatory Visit | Attending: Radiation Oncology | Admitting: Radiation Oncology

## 2018-01-14 ENCOUNTER — Telehealth: Payer: Self-pay | Admitting: *Deleted

## 2018-01-14 DIAGNOSIS — C090 Malignant neoplasm of tonsillar fossa: Secondary | ICD-10-CM | POA: Diagnosis not present

## 2018-01-14 MED FILL — ONDANSETRON HCL 8 MG TABLET: 8 | 15 days supply | Qty: 30 | Fill #1

## 2018-01-14 NOTE — Telephone Encounter (Signed)
Received call from [facility] with [place] questioning the fluid orders for the patient.  She states that the wife is questioning the order and states that it supposed to be daily 7 days a week.  Advised that Dr Maylon Peppers reiterated over the phone with the patient that he was supposed to get IVF M-F.

## 2018-01-15 ENCOUNTER — Ambulatory Visit
Admission: RE | Admit: 2018-01-15 | Discharge: 2018-01-15 | Disposition: A | Discharge status: Home / Self Care | Payer: BLUE CROSS/BLUE SHIELD | Source: Ambulatory Visit | Attending: Radiation Oncology | Admitting: Radiation Oncology

## 2018-01-15 DIAGNOSIS — C090 Malignant neoplasm of tonsillar fossa: Secondary | ICD-10-CM | POA: Diagnosis not present

## 2018-01-16 ENCOUNTER — Inpatient Hospital Stay: Payer: BLUE CROSS/BLUE SHIELD

## 2018-01-16 ENCOUNTER — Ambulatory Visit
Admission: RE | Admit: 2018-01-16 | Discharge: 2018-01-16 | Disposition: A | Discharge status: Home / Self Care | Payer: BLUE CROSS/BLUE SHIELD | Source: Ambulatory Visit | Attending: Radiation Oncology | Admitting: Radiation Oncology

## 2018-01-16 DIAGNOSIS — C090 Malignant neoplasm of tonsillar fossa: Secondary | ICD-10-CM

## 2018-01-16 LAB — CBC WITH DIFFERENTIAL (CANCER CENTER ONLY)
Abs Immature Granulocytes: 0 10*3/uL (ref 0.00–0.07)
Basophils Absolute: 0 10*3/uL (ref 0.0–0.1)
Basophils Relative: 1 %
Eosinophils Absolute: 0.1 10*3/uL (ref 0.0–0.5)
Eosinophils Relative: 5 %
HCT: 35.8 % — ABNORMAL LOW (ref 39.0–52.0)
Hemoglobin: 11.7 g/dL — ABNORMAL LOW (ref 13.0–17.0)
Immature Granulocytes: 0 %
Lymphocytes Relative: 19 %
Lymphs Abs: 0.3 10*3/uL — ABNORMAL LOW (ref 0.7–4.0)
MCH: 28 pg (ref 26.0–34.0)
MCHC: 32.7 g/dL (ref 30.0–36.0)
MCV: 85.6 fL (ref 80.0–100.0)
Monocytes Absolute: 0.2 10*3/uL (ref 0.1–1.0)
Monocytes Relative: 15 %
Neutro Abs: 0.9 10*3/uL — ABNORMAL LOW (ref 1.7–7.7)
Neutrophils Relative %: 60 %
Platelet Count: 160 10*3/uL (ref 150–400)
RBC: 4.18 MIL/uL — ABNORMAL LOW (ref 4.22–5.81)
RDW: 11.9 % (ref 11.5–15.5)
WBC Count: 1.5 10*3/uL — ABNORMAL LOW (ref 4.0–10.5)
nRBC: 0 % (ref 0.0–0.2)

## 2018-01-16 LAB — BASIC METABOLIC PANEL - CANCER CENTER ONLY
Anion gap: 7 (ref 5–15)
BUN: 14 mg/dL (ref 6–20)
CO2: 29 mmol/L (ref 22–32)
Calcium: 9.2 mg/dL (ref 8.9–10.3)
Chloride: 103 mmol/L (ref 98–111)
Creatinine: 1.13 mg/dL (ref 0.61–1.24)
GFR, Est AFR Am: >60 mL/min (ref >60)
GFR, Estimated: >60 mL/min (ref >60)
Glucose, Bld: 177 mg/dL — ABNORMAL HIGH (ref 70–99)
Potassium: 3.8 mmol/L (ref 3.5–5.1)
Sodium: 139 mmol/L (ref 135–145)

## 2018-01-16 LAB — MAGNESIUM: Magnesium: 2 mg/dL (ref 1.7–2.4)

## 2018-01-17 ENCOUNTER — Other Ambulatory Visit: Payer: Self-pay

## 2018-01-17 ENCOUNTER — Ambulatory Visit
Admission: RE | Admit: 2018-01-17 | Discharge: 2018-01-17 | Disposition: A | Discharge status: Home / Self Care | Payer: BLUE CROSS/BLUE SHIELD | Source: Ambulatory Visit | Attending: Radiation Oncology | Admitting: Radiation Oncology

## 2018-01-17 ENCOUNTER — Inpatient Hospital Stay: Payer: BLUE CROSS/BLUE SHIELD | Admitting: Nutrition

## 2018-01-17 ENCOUNTER — Encounter: Payer: Self-pay | Admitting: Hematology

## 2018-01-17 ENCOUNTER — Inpatient Hospital Stay (HOSPITAL_BASED_OUTPATIENT_CLINIC_OR_DEPARTMENT_OTHER): Payer: BLUE CROSS/BLUE SHIELD | Admitting: Hematology

## 2018-01-17 ENCOUNTER — Telehealth: Payer: Self-pay | Admitting: Hematology

## 2018-01-17 VITALS — BP 116/70 | HR 71 | Temp 98.2°F | Resp 18 | Wt 167.0 lb

## 2018-01-17 DIAGNOSIS — C090 Malignant neoplasm of tonsillar fossa: Secondary | ICD-10-CM | POA: Diagnosis not present

## 2018-01-17 DIAGNOSIS — R110 Nausea: Secondary | ICD-10-CM

## 2018-01-17 DIAGNOSIS — Z931 Gastrostomy status: Secondary | ICD-10-CM | POA: Diagnosis not present

## 2018-01-17 DIAGNOSIS — Z79899 Other long term (current) drug therapy: Secondary | ICD-10-CM

## 2018-01-17 DIAGNOSIS — C770 Secondary and unspecified malignant neoplasm of lymph nodes of head, face and neck: Secondary | ICD-10-CM

## 2018-01-17 DIAGNOSIS — T451X5A Adverse effect of antineoplastic and immunosuppressive drugs, initial encounter: Secondary | ICD-10-CM | POA: Insufficient documentation

## 2018-01-17 DIAGNOSIS — R432 Parageusia: Secondary | ICD-10-CM | POA: Insufficient documentation

## 2018-01-17 DIAGNOSIS — T451X5S Adverse effect of antineoplastic and immunosuppressive drugs, sequela: Secondary | ICD-10-CM

## 2018-01-17 DIAGNOSIS — N179 Acute kidney failure, unspecified: Secondary | ICD-10-CM | POA: Insufficient documentation

## 2018-01-17 DIAGNOSIS — R438 Other disturbances of smell and taste: Secondary | ICD-10-CM

## 2018-01-17 DIAGNOSIS — D701 Agranulocytosis secondary to cancer chemotherapy: Secondary | ICD-10-CM

## 2018-01-17 DIAGNOSIS — E46 Unspecified protein-calorie malnutrition: Secondary | ICD-10-CM | POA: Insufficient documentation

## 2018-01-17 DIAGNOSIS — E440 Moderate protein-calorie malnutrition: Secondary | ICD-10-CM

## 2018-01-17 DIAGNOSIS — K1231 Oral mucositis (ulcerative) due to antineoplastic therapy: Secondary | ICD-10-CM

## 2018-01-17 MED ORDER — ONDANSETRON HCL 8 MG PO TABS: 8.0000 mg | ORAL_TABLET | Freq: Three times a day (TID) | ORAL | 1 refills | Status: AC | PRN

## 2018-01-17 NOTE — Telephone Encounter (Signed)
Spoke with patient to confirm appt time change on 11/7 to 1130 due to MD On Call

## 2018-01-17 NOTE — Progress Notes (Signed)
[facility] OFFICE PROGRESS NOTE  Patient Care Team: Martinique, Betty G, MD as PCP - General (Family Medicine) Eppie Gibson, MD as Attending Physician (Radiation Oncology) Tish Men, MD as Consulting Physician (Hematology) Leota Sauers, RN as Oncology Nurse Navigator (Oncology) Karie Mainland, RD as Dietitian (Nutrition) Valentino Saxon [place], CCC-SLP as Speech Language Pathologist (Speech Pathology) Wynelle Beckmann, Melodie Bouillon, PT as Physical Therapist (Physical Therapy)  HEME/ONC OVERVIEW: 1. 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 01/25/2018)  Chemotherapy-induced leukopenia and neutropenia -WBC 1.5 with ANC 900 today -Patient denies any symptom of infection -Due to the neutropenia, we will delay the 2nd dose of chemotherapy to 01/25/2018 -I encouraged the patient to adhere to good hand hygiene; he understands to call if he develops any fever or other symptoms of infection  Chemotherapy-induced nausea -Relatively controlled with PRN Zofran and Compazine -I refilled Zofran prescription today  Acute kidney injury -Cr 1.13 today, improving with home IV fluid (M-F, 1L/day) -Secondary to dehydration and cisplatin-related nephrotoxicity -Weekly renal function monitoring   Protein malnutrition -The patient lost ~10 lbs since mid-September 2019; weight has been relatively stable since mid-October 2019 -Home IV fluid initiated due to dehydration -I encouraged the patient to  adhere to the enteral nutrition as prescribed by the dietician   Treatment-related Mucositis -The pain is relatively well controlled; patient has required minimal opioid pain medication -Continue PRN morphine liquid   All questions were answered. The patient knows to call the clinic with any problems, questions or concerns. No barriers to learning was detected.  A total of more than 40 minutes were spent face-to-face with the patient during this encounter and over half of that time was spent on counseling and coordination of care as outlined above.   Tish Men, MD 01/17/2018 12:19 PM  CHIEF COMPLAINT: "I am here for labs"  INTERVAL HISTORY: [name] returns to clinic for toxicity check and lab follow-up prior to second dose of cisplatin.  He reports that since starting home IV fluids, he has been able to use his feeding tube primarily for nutritional supplements and has not had significant early satiety.  He reports moderate, intermittent nausea, for which is relatively adequately controlled with PRN Zofran and Compazine.  He reports "bad taste in the mouth," but is able to eat and drink by mouth without significant dysphasia or odynophagia.  He denies any fever, chill, night sweats, chest pain, dyspnea, palpitation, change in hearing or neuropathy in the hands or feet.  SUMMARY OF ONCOLOGIC HISTORY:   Malignant neoplasm of tonsillar fossa ([place])   10/30/2017 Miscellaneous    Presented to Dr. Redmond Baseman with 3 months of R neck mass    11/07/2017 Imaging    CT neck w/ contrast:  1. Right tonsillar mass measures up to 2.6 cm, highly concerning for a squamous cell carcinoma. 2. Enlarged homogeneous right level 2 lymph node measures up to 3.9 cm, consistent with metastatic disease. 3. Adjacent right level 3 lymph node measures 1.1 cm. 4. Lucency at  C4 on the left is likely degenerative. Metastatic disease is not excluded. Please correlate with findings on PET scan if used for further evaluation.     11/12/2017 Procedure    R tonsillar biopsy by Dr. Janace Hoard    11/12/2017 Pathology Results    Accession: [id number] Invasive squamous cell carcinoma; p16 diffusely positive    11/28/2017 Imaging    PET scan: 1. Right tonsillar fossa mass is intensely hypermetabolic compatible with primary neoplasm. 2. Solitary enlarged and hypermetabolic right level 2 cervical lymph node compatible with metastatic adenopathy. 3. No evidence for FDG avid hypermetabolic distant metastasis.    11/30/2017 Cancer Staging    Staging form: Pharynx - HPV-Mediated Oropharynx, AJCC 8th Edition - Clinical stage from 11/30/2017: Stage I (cT2, cN1, cM0, p16+) - Signed by Eppie Gibson, MD on 11/30/2017    12/07/2017 Miscellaneous    ENT evaluation to [facility] recommended against surgical resection due to the tumor extending into the right inferior nasopharynx and posterior soft palate, which would require resection of almost 1/2 of the soft palate and the right nasopharyngeal wall extending into right posterior/lateral posterior wall.     Procedure    12/26/2017: port and PEG tube placement     12/12/2017 -  Chemotherapy    The patient had palonosetron (ALOXI) injection 0.25 mg, 0.25 mg, Intravenous,  Once, 1 of 3 cycles Administration: 0.25 mg (12/28/2017) CISplatin (PLATINOL) 196 mg in sodium chloride 0.9 % 500 mL chemo infusion, 100 mg/m2 = 196 mg, Intravenous,  Once, 1 of 3 cycles Administration: 196 mg (12/28/2017) fosaprepitant (EMEND) 150 mg, dexamethasone (DECADRON) 12 mg in sodium chloride 0.9 % 145 mL IVPB, , Intravenous,  Once, 1 of 3 cycles Administration:  (12/28/2017)  for chemotherapy treatment.      REVIEW OF SYSTEMS:   Constitutional: ( - ) fevers, ( - )  chills , ( - ) night sweats Eyes: ( - ) blurriness of vision, ( - ) double vision, ( - ) watery eyes Ears, nose, mouth, throat, and face: ( + ) minimal mucositis, ( - ) sore throat Respiratory: ( - ) cough, ( - ) dyspnea, ( - )  wheezes Cardiovascular: ( - ) palpitation, ( - ) chest discomfort, ( - ) lower extremity swelling Gastrointestinal:  ( + ) nausea, ( - ) heartburn, ( - ) change in bowel habits Skin: ( - ) abnormal skin rashes Lymphatics: ( - ) new lymphadenopathy, ( - ) easy bruising Neurological: ( - ) numbness, ( - ) tingling, ( - ) new weaknesses Behavioral/Psych: ( - ) mood change, ( - ) new changes  All other systems were reviewed with the patient and are negative.  I have reviewed the past medical history, past surgical history, social history and family history with the patient and they are unchanged from previous note.  ALLERGIES:  has No Known Allergies.  MEDICATIONS:  Current Outpatient Medications  Medication Sig Dispense Refill  . dexamethasone (DECADRON) 4 MG tablet Take 2 tablets by mouth once a day on the day after chemotherapy and then take 2 tablets two times a day for 2 days. Take with food. 30 tablet 1  . lidocaine (XYLOCAINE) 2 % solution Patient: Mix 1part 2% viscous lidocaine, 1part H20. Swish & swallow 79mL of diluted mixture, 49min before meals and at bedtime, up to QID 100 mL 5  . lidocaine-prilocaine (EMLA) cream Apply to affected area once 30 g 3  . LORazepam (ATIVAN) 0.5 MG tablet Take 1 tablet (0.5  mg total) by mouth every 6 (six) hours as needed (Nausea or vomiting). 30 tablet 0  . Morphine Sulfate (MORPHINE CONCENTRATE) 10 mg / 0.5 ml concentrated solution Take 0.5 mLs (10 mg total) by mouth every 3 (three) hours as needed for severe pain. 120 mL 0  . Nutritional Supplements (FEEDING SUPPLEMENT, OSMOLITE 1.5 CAL,) LIQD Begin 1.5 bottles Osmolite 1.5 via PEG QID with 60 mL free water before and after bolus feedings. Mix 30 mL Promod with 60 mL water BID and put into PEG. Flush with 30 mL water. Do not mix Promod with Osmolite 1.5 6 Bottle 0  . ondansetron (ZOFRAN) 8 MG tablet Take 1 tablet (8 mg total) by mouth every 8 (eight) hours as needed. Start on the third day after  chemotherapy. 60 tablet 1  . oxyCODONE-acetaminophen (PERCOCET/ROXICET) 5-325 MG tablet   0  . prochlorperazine (COMPAZINE) 10 MG tablet Take 1 tablet (10 mg total) by mouth every 6 (six) hours as needed (Nausea or vomiting). 30 tablet 1  . sodium fluoride (PREVIDENT 5000 PLUS) 1.1 % CREA dental cream Apply cream to tooth brush. Brush teeth for 2 minutes. Spit out excess. DO NOT rinse afterwards. Repeat nightly. 1 Tube prn   No current facility-administered medications for this visit.     PHYSICAL EXAMINATION: ECOG PERFORMANCE STATUS: 1 - Symptomatic but completely ambulatory  Today's Vitals   01/17/18 1158  BP: 116/70  Pulse: 71  Resp: 18  Temp: 98.2 F (36.8 C)  TempSrc: Oral  SpO2: 100%  Weight: 167 lb (75.8 kg)  PainSc: 0-No pain   Body mass index is 23.96 kg/m.  Filed Weights   01/17/18 1158  Weight: 167 lb (75.8 kg)    GENERAL: alert, no distress and comfortable SKIN: skin color, texture, turgor are normal, no rashes or significant lesions EYES: conjunctiva are pink and non-injected, sclera clear OROPHARYNX: no exudate, no erythema; lips, buccal mucosa, and tongue normal  NECK: ~3x3cm R cervical LN, mobile, mildly tender LYMPH:  No other palpable lymphadenopathy in the cervical or axillary  LUNGS: clear to auscultation and percussion with normal breathing effort HEART: regular rate & rhythm and no murmurs and no lower extremity edema ABDOMEN: soft, non-tender, non-distended, normal bowel sounds Musculoskeletal: no cyanosis of digits and no clubbing  PSYCH: alert & oriented x 3, fluent speech NEURO: no focal motor/sensory deficits  LABORATORY DATA:  I have reviewed the data as listed    Component Value Date/Time   NA 139 01/16/2018 1530   K 3.8 01/16/2018 1530   CL 103 01/16/2018 1530   CO2 29 01/16/2018 1530   GLUCOSE 177 (H) 01/16/2018 1530   BUN 14 01/16/2018 1530   CREATININE 1.13 01/16/2018 1530   CALCIUM 9.2 01/16/2018 1530   PROT 7.3 12/12/2017 1426    ALBUMIN 4.2 12/12/2017 1426   AST 13 (L) 12/12/2017 1426   ALT 20 12/12/2017 1426   ALKPHOS 75 12/12/2017 1426   BILITOT 0.5 12/12/2017 1426   GFRNONAA >60 01/16/2018 1530   GFRAA >60 01/16/2018 1530    No results found for: SPEP, UPEP  Lab Results  Component Value Date   WBC 1.5 (L) 01/16/2018   NEUTROABS 0.9 (L) 01/16/2018   HGB 11.7 (L) 01/16/2018   HCT 35.8 (L) 01/16/2018   MCV 85.6 01/16/2018   PLT 160 01/16/2018      Chemistry      Component Value Date/Time   NA 139 01/16/2018 1530   K 3.8 01/16/2018 1530   CL 103  01/16/2018 1530   CO2 29 01/16/2018 1530   BUN 14 01/16/2018 1530   CREATININE 1.13 01/16/2018 1530      Component Value Date/Time   CALCIUM 9.2 01/16/2018 1530   ALKPHOS 75 12/12/2017 1426   AST 13 (L) 12/12/2017 1426   ALT 20 12/12/2017 1426   BILITOT 0.5 12/12/2017 1426

## 2018-01-17 NOTE — Progress Notes (Addendum)
Nutrition follow-up completed with patient prior to radiation therapy for right tonsil cancer. Patient reports taste alterations continue and he is extra sensitive to smells. Patient is drinking water by mouth but is taking nothing else by mouth. Weight is up slightly at 167 pounds today. Reports he is tolerating 1-1/2 bottles Osmolite 1.5 via PEG 3 times daily with 60 mL free water before and after each bolus feeding.   He has not yet started ProMod but is agreeable to doing so. Patient denies diarrhea or constipation. Nausea seems to be controlled with antiemetics.  Nutrition diagnosis: Inadequate oral intake continues.  Intervention: Patient should continue 1 and 1-1/2 bottles Osmolite 1.5 via PEG 4 times a day with 60 mL free water before and after each bolus feeding. He will give 30 mL ProMod via PEG mixed with 60 mL water and flush after with 30 mL free water. Recommended patient continue water by mouth.  Provided support and encouragement.  Tube feeding plus ProMod and free water flushes provides 2300 cal, 109 g protein, 1746 mL free water.  Free water flushes plus IV fluids providing 2746 mL daily.  Monitoring, evaluation, goals: Patient will tolerate tube feeding at goal rate to minimize weight loss.  Next visit: Tuesday, November 5.  **Disclaimer: This note was dictated with voice recognition software. Similar sounding words can inadvertently be transcribed and this note may contain transcription errors which may not have been corrected upon publication of note.**

## 2018-01-18 ENCOUNTER — Ambulatory Visit
Admission: RE | Admit: 2018-01-18 | Discharge: 2018-01-18 | Disposition: A | Discharge status: Home / Self Care | Payer: BLUE CROSS/BLUE SHIELD | Source: Ambulatory Visit | Attending: Radiation Oncology | Admitting: Radiation Oncology

## 2018-01-18 ENCOUNTER — Inpatient Hospital Stay: Payer: BLUE CROSS/BLUE SHIELD

## 2018-01-18 DIAGNOSIS — R1310 Dysphagia, unspecified: Secondary | ICD-10-CM | POA: Diagnosis not present

## 2018-01-18 DIAGNOSIS — C090 Malignant neoplasm of tonsillar fossa: Secondary | ICD-10-CM

## 2018-01-21 ENCOUNTER — Ambulatory Visit
Admission: RE | Admit: 2018-01-21 | Discharge: 2018-01-21 | Disposition: A | Discharge status: Home / Self Care | Payer: BLUE CROSS/BLUE SHIELD | Source: Ambulatory Visit | Attending: Radiation Oncology | Admitting: Radiation Oncology

## 2018-01-21 DIAGNOSIS — R1310 Dysphagia, unspecified: Secondary | ICD-10-CM | POA: Diagnosis not present

## 2018-01-22 ENCOUNTER — Ambulatory Visit
Admission: RE | Admit: 2018-01-22 | Discharge: 2018-01-22 | Disposition: A | Discharge status: Home / Self Care | Payer: BLUE CROSS/BLUE SHIELD | Source: Ambulatory Visit | Attending: Radiation Oncology | Admitting: Radiation Oncology

## 2018-01-22 ENCOUNTER — Inpatient Hospital Stay: Payer: BLUE CROSS/BLUE SHIELD | Attending: Hematology | Admitting: Nutrition

## 2018-01-22 DIAGNOSIS — E860 Dehydration: Secondary | ICD-10-CM | POA: Insufficient documentation

## 2018-01-22 DIAGNOSIS — R1310 Dysphagia, unspecified: Secondary | ICD-10-CM | POA: Diagnosis not present

## 2018-01-22 DIAGNOSIS — K1231 Oral mucositis (ulcerative) due to antineoplastic therapy: Secondary | ICD-10-CM | POA: Insufficient documentation

## 2018-01-22 DIAGNOSIS — D6959 Other secondary thrombocytopenia: Secondary | ICD-10-CM | POA: Insufficient documentation

## 2018-01-22 DIAGNOSIS — R110 Nausea: Secondary | ICD-10-CM | POA: Insufficient documentation

## 2018-01-22 DIAGNOSIS — E46 Unspecified protein-calorie malnutrition: Secondary | ICD-10-CM | POA: Insufficient documentation

## 2018-01-22 DIAGNOSIS — Z931 Gastrostomy status: Secondary | ICD-10-CM | POA: Insufficient documentation

## 2018-01-22 DIAGNOSIS — N179 Acute kidney failure, unspecified: Secondary | ICD-10-CM | POA: Insufficient documentation

## 2018-01-22 DIAGNOSIS — Z79899 Other long term (current) drug therapy: Secondary | ICD-10-CM | POA: Insufficient documentation

## 2018-01-22 DIAGNOSIS — M542 Cervicalgia: Secondary | ICD-10-CM | POA: Insufficient documentation

## 2018-01-22 DIAGNOSIS — C090 Malignant neoplasm of tonsillar fossa: Secondary | ICD-10-CM | POA: Insufficient documentation

## 2018-01-22 DIAGNOSIS — T451X5S Adverse effect of antineoplastic and immunosuppressive drugs, sequela: Secondary | ICD-10-CM | POA: Insufficient documentation

## 2018-01-22 DIAGNOSIS — L538 Other specified erythematous conditions: Secondary | ICD-10-CM | POA: Insufficient documentation

## 2018-01-22 DIAGNOSIS — H9313 Tinnitus, bilateral: Secondary | ICD-10-CM | POA: Insufficient documentation

## 2018-01-22 DIAGNOSIS — C770 Secondary and unspecified malignant neoplasm of lymph nodes of head, face and neck: Secondary | ICD-10-CM | POA: Insufficient documentation

## 2018-01-22 DIAGNOSIS — K219 Gastro-esophageal reflux disease without esophagitis: Secondary | ICD-10-CM | POA: Insufficient documentation

## 2018-01-22 DIAGNOSIS — R51 Headache: Secondary | ICD-10-CM | POA: Insufficient documentation

## 2018-01-22 DIAGNOSIS — Y842 Radiological procedure and radiotherapy as the cause of abnormal reaction of the patient, or of later complication, without mention of misadventure at the time of the procedure: Secondary | ICD-10-CM | POA: Insufficient documentation

## 2018-01-22 DIAGNOSIS — D701 Agranulocytosis secondary to cancer chemotherapy: Secondary | ICD-10-CM | POA: Insufficient documentation

## 2018-01-22 DIAGNOSIS — R634 Abnormal weight loss: Secondary | ICD-10-CM | POA: Insufficient documentation

## 2018-01-22 DIAGNOSIS — Z5111 Encounter for antineoplastic chemotherapy: Secondary | ICD-10-CM | POA: Insufficient documentation

## 2018-01-22 MED FILL — ONDANSETRON HCL 8 MG TABLET: 8 | 20 days supply | Qty: 60 | Fill #0

## 2018-01-22 NOTE — Progress Notes (Signed)
Nutrition follow-up completed with patient prior to radiation therapy for right tonsil cancer. Weight was documented as 164.8 pounds decreased from 167 pounds last week. Patient reports he has no new side effects from treatment. He takes nausea medication when he feels nauseated. He continues to drink water and receive IV fluids 5 days a week. Patient denies any problems infusing Osmolite 1.5, 6 bottles daily, with 30 mL of ProMod daily via PEG. Concerned about what side effects he may experience with his next chemotherapy.  Estimated nutrition needs: 2250-2450 cal, 95-115 g protein, 2.4 L fluid.  Nutrition diagnosis: Inadequate oral intake continues.  Intervention: Patient educated to continue tube feeding and ProMod as tolerated. Stressed importance of continuing nausea medications to prevent nausea. Encouraged him to continue increased fluid intake.   Questions were answered.  Teach back method used.  Tube feeding plus ProMod and free water flushes provides 2300 cal, 109 g protein, 1746 mL free water.  Free water flushes plus IV fluids are providing 2746 mL water daily.  Monitoring, evaluation, goals: Patient will continue to tolerate tube feeding plus ProMod and free water to meet 100% estimated nutrition needs.  Next visit: Tuesday, November 12 prior to radiation therapy.  **Disclaimer: This note was dictated with voice recognition software. Similar sounding words can inadvertently be transcribed and this note may contain transcription errors which may not have been corrected upon publication of note.**

## 2018-01-23 ENCOUNTER — Inpatient Hospital Stay: Payer: BLUE CROSS/BLUE SHIELD

## 2018-01-23 ENCOUNTER — Encounter: Payer: Self-pay | Admitting: Hematology

## 2018-01-23 ENCOUNTER — Other Ambulatory Visit: Payer: Self-pay | Admitting: Hematology

## 2018-01-23 ENCOUNTER — Ambulatory Visit
Admission: RE | Admit: 2018-01-23 | Discharge: 2018-01-23 | Disposition: A | Discharge status: Home / Self Care | Payer: BLUE CROSS/BLUE SHIELD | Source: Ambulatory Visit | Attending: Radiation Oncology | Admitting: Radiation Oncology

## 2018-01-23 DIAGNOSIS — R51 Headache: Secondary | ICD-10-CM | POA: Diagnosis not present

## 2018-01-23 DIAGNOSIS — T451X5S Adverse effect of antineoplastic and immunosuppressive drugs, sequela: Secondary | ICD-10-CM | POA: Diagnosis not present

## 2018-01-23 DIAGNOSIS — R110 Nausea: Secondary | ICD-10-CM | POA: Diagnosis not present

## 2018-01-23 DIAGNOSIS — C090 Malignant neoplasm of tonsillar fossa: Secondary | ICD-10-CM

## 2018-01-23 DIAGNOSIS — C770 Secondary and unspecified malignant neoplasm of lymph nodes of head, face and neck: Secondary | ICD-10-CM | POA: Diagnosis not present

## 2018-01-23 DIAGNOSIS — L538 Other specified erythematous conditions: Secondary | ICD-10-CM | POA: Diagnosis not present

## 2018-01-23 DIAGNOSIS — H9313 Tinnitus, bilateral: Secondary | ICD-10-CM | POA: Diagnosis not present

## 2018-01-23 DIAGNOSIS — K1231 Oral mucositis (ulcerative) due to antineoplastic therapy: Secondary | ICD-10-CM | POA: Diagnosis not present

## 2018-01-23 DIAGNOSIS — Z79899 Other long term (current) drug therapy: Secondary | ICD-10-CM | POA: Diagnosis not present

## 2018-01-23 DIAGNOSIS — R1310 Dysphagia, unspecified: Secondary | ICD-10-CM | POA: Diagnosis not present

## 2018-01-23 DIAGNOSIS — N179 Acute kidney failure, unspecified: Secondary | ICD-10-CM | POA: Diagnosis not present

## 2018-01-23 DIAGNOSIS — D701 Agranulocytosis secondary to cancer chemotherapy: Secondary | ICD-10-CM | POA: Diagnosis not present

## 2018-01-23 DIAGNOSIS — Z5111 Encounter for antineoplastic chemotherapy: Secondary | ICD-10-CM | POA: Diagnosis not present

## 2018-01-23 DIAGNOSIS — Z95828 Presence of other vascular implants and grafts: Secondary | ICD-10-CM

## 2018-01-23 DIAGNOSIS — R634 Abnormal weight loss: Secondary | ICD-10-CM | POA: Diagnosis not present

## 2018-01-23 DIAGNOSIS — D6959 Other secondary thrombocytopenia: Secondary | ICD-10-CM | POA: Diagnosis not present

## 2018-01-23 DIAGNOSIS — Y842 Radiological procedure and radiotherapy as the cause of abnormal reaction of the patient, or of later complication, without mention of misadventure at the time of the procedure: Secondary | ICD-10-CM | POA: Diagnosis not present

## 2018-01-23 DIAGNOSIS — K219 Gastro-esophageal reflux disease without esophagitis: Secondary | ICD-10-CM | POA: Diagnosis not present

## 2018-01-23 DIAGNOSIS — M542 Cervicalgia: Secondary | ICD-10-CM | POA: Diagnosis not present

## 2018-01-23 DIAGNOSIS — E860 Dehydration: Secondary | ICD-10-CM | POA: Diagnosis not present

## 2018-01-23 DIAGNOSIS — E46 Unspecified protein-calorie malnutrition: Secondary | ICD-10-CM | POA: Diagnosis not present

## 2018-01-23 DIAGNOSIS — Z931 Gastrostomy status: Secondary | ICD-10-CM | POA: Diagnosis not present

## 2018-01-23 LAB — BASIC METABOLIC PANEL - CANCER CENTER ONLY
Anion gap: 7 (ref 5–15)
BUN: 11 mg/dL (ref 6–20)
CO2: 29 mmol/L (ref 22–32)
Calcium: 9 mg/dL (ref 8.9–10.3)
Chloride: 104 mmol/L (ref 98–111)
Creatinine: 1.03 mg/dL (ref 0.61–1.24)
GFR, Est AFR Am: >60 mL/min (ref >60)
GFR, Estimated: >60 mL/min (ref >60)
Glucose, Bld: 168 mg/dL — ABNORMAL HIGH (ref 70–99)
Potassium: 3.7 mmol/L (ref 3.5–5.1)
Sodium: 140 mmol/L (ref 135–145)

## 2018-01-23 LAB — CBC WITH DIFFERENTIAL (CANCER CENTER ONLY)
Abs Immature Granulocytes: 0.01 10*3/uL (ref 0.00–0.07)
Basophils Absolute: 0 10*3/uL (ref 0.0–0.1)
Basophils Relative: 0 %
Eosinophils Absolute: 0.1 10*3/uL (ref 0.0–0.5)
Eosinophils Relative: 2 %
HCT: 35.7 % — ABNORMAL LOW (ref 39.0–52.0)
Hemoglobin: 11.9 g/dL — ABNORMAL LOW (ref 13.0–17.0)
Immature Granulocytes: 0 %
Lymphocytes Relative: 10 %
Lymphs Abs: 0.3 10*3/uL — ABNORMAL LOW (ref 0.7–4.0)
MCH: 28.4 pg (ref 26.0–34.0)
MCHC: 33.3 g/dL (ref 30.0–36.0)
MCV: 85.2 fL (ref 80.0–100.0)
Monocytes Absolute: 0.2 10*3/uL (ref 0.1–1.0)
Monocytes Relative: 10 %
Neutro Abs: 1.9 10*3/uL (ref 1.7–7.7)
Neutrophils Relative %: 78 %
Platelet Count: 124 10*3/uL — ABNORMAL LOW (ref 150–400)
RBC: 4.19 MIL/uL — ABNORMAL LOW (ref 4.22–5.81)
RDW: 12.3 % (ref 11.5–15.5)
WBC Count: 2.4 10*3/uL — ABNORMAL LOW (ref 4.0–10.5)
nRBC: 0 % (ref 0.0–0.2)

## 2018-01-23 LAB — MAGNESIUM: Magnesium: 2 mg/dL (ref 1.7–2.4)

## 2018-01-23 MED ORDER — HEPARIN SOD (PORK) LOCK FLUSH 100 UNIT/ML IV SOLN
500.0000 [IU] | Freq: Once | INTRAVENOUS | Status: AC | PRN
  Administered 2018-01-23: 500 [IU]
  Filled 2018-01-23: qty 5

## 2018-01-23 MED ORDER — SODIUM CHLORIDE 0.9% FLUSH
10.0000 mL | INTRAVENOUS | Status: DC | PRN
  Administered 2018-01-23: 10 mL
  Filled 2018-01-23: qty 10

## 2018-01-23 NOTE — Progress Notes (Signed)
[facility] OFFICE PROGRESS NOTE  Patient Care Team: Martinique, Betty G, MD as PCP - General (Family Medicine) Eppie Gibson, MD as Attending Physician (Radiation Oncology) Tish Men, MD as Consulting Physician (Hematology) Leota Sauers, RN as Oncology Nurse Navigator (Oncology) Karie Mainland, RD as Dietitian (Nutrition) Valentino Saxon Perry Mount, CCC-SLP as Speech Language Pathologist (Speech Pathology) Wynelle Beckmann, Melodie Bouillon, PT as Physical Therapist (Physical Therapy)  HEME/ONC OVERVIEW: 1. 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, p16+ -For detailed oncologic hx, see below -Concurrent chemoRT started on 12/28/2017 -Dose #2 of cisplatin delayed by 1 week due to leukopenia with neutropenia -Labs improving, okay to resume Cycle 2 on 01/25/2018 -Given the Grade 3 neutropenia, I have reduced the dose of cisplatin to 8083m2, starting with the 2nd dose    Chemotherapy-induced leukopenia and neutropenia -Cycle 2 delayed by 1 week due to Grade 3 leukopenia with neutropenia -Neutropenia resolved today; leukopenia is improving  -Cisplatin dose reduced as noted above  -Patient denies any symptom of infection  Chemotherapy-induced thrombocytopenia -Platelet count improving and 124k -Patient denies any symptom of bleeding or bruising -We will continue to monitor it for now  Chemotherapy-induced nausea -Relatively controlled with PRN Zofran and Compazine -Continue anti-emetics as prescribed  Recent acute kidney injury -Cr 1.0 today, improving with home IV fluid (M-F, 1L/day) -Secondary to dehydration and cisplatin-related  nephrotoxicity -Weekly renal function monitoring   Protein malnutrition -The patient lost ~10 lbs since mid-September 2019; weight has been relatively stable since mid-October 2019 -Home IV fluid initiated due to dehydration -I encouraged the patient to adhere to the enteral nutrition as prescribed by the dietician   Treatment-related Mucositis -Minimal pain, no requiring morphine -Continue PRN liquid morphine   All questions were answered. The patient knows to call the clinic with any problems, questions or concerns. No barriers to learning was detected.  A total of more than 40 minutes were spent face-to-face with the patient during this encounter and over half of that time was spent on counseling and coordination of care as outlined above.   Continue weekly lab monitoring and clinic follow-up for toxicity checks (11/13, 11/20, 11/27).   YanTish MenD 01/23/2018 10:23 AM  CHIEF COMPLAINT: "I am here for my labs"  INTERVAL HISTORY: Thomas Mccall to clinic for labs and toxicity check prior to cycle #2 of every 3 week cisplatin.  He reports that he has had intermittent headaches for the past a few weeks, one to 2 times per week, lasting about an hour, localized to the frontal head, without any associated vision change, lightheadedness or other neurologic changes.  He has tried sitting in a massage chair or taking a hot shower with some relief.  He also reports slightly more nausea, requiring PRN Zofran, Compazine and occasional Ativan, with relatively adequate control.  He is currently only drinking water by mouth due to the poor taste, but denies any significant dysphagia or odynophagia.  He tried Mucinex with improvement in the thick oral secretions.  He is still working full-time.  He is compliant with enteral feeding without early satiety.  He has not had any  trouble with administering home IV fluids Monday through Friday.  He denies any fever, chill, chest pain, dyspnea, tinnitus, or  numbness/tingling in the extremities.  SUMMARY OF ONCOLOGIC HISTORY:   Malignant neoplasm of tonsillar fossa ([place])   10/30/2017 Miscellaneous    Presented to Dr. Redmond Baseman with 3 months of R neck mass    11/07/2017 Imaging    CT neck w/ contrast:  1. Right tonsillar mass measures up to 2.6 cm, highly concerning for a squamous cell carcinoma. 2. Enlarged homogeneous right level 2 lymph node measures up to 3.9 cm, consistent with metastatic disease. 3. Adjacent right level 3 lymph node measures 1.1 cm. 4. Lucency at C4 on the left is likely degenerative. Metastatic disease is not excluded. Please correlate with findings on PET scan if used for further evaluation.    11/12/2017 Procedure    R tonsillar biopsy by Dr. Janace Hoard    11/12/2017 Pathology Results    Accession: [id number] Invasive squamous cell carcinoma; p16 diffusely positive    11/28/2017 Imaging    PET scan: 1. Right tonsillar fossa mass is intensely hypermetabolic compatible with primary neoplasm. 2. Solitary enlarged and hypermetabolic right level 2 cervical lymph node compatible with metastatic adenopathy. 3. No evidence for FDG avid hypermetabolic distant metastasis.    11/30/2017 Cancer Staging    Staging form: Pharynx - HPV-Mediated Oropharynx, AJCC 8th Edition - Clinical stage from 11/30/2017: Stage I (cT2, cN1, cM0, p16+) - Signed by Eppie Gibson, MD on 11/30/2017    12/07/2017 Miscellaneous    ENT evaluation to [facility] recommended against surgical resection due to the tumor extending into the right inferior nasopharynx and posterior soft palate, which would require resection of almost 1/2 of the soft palate and the right nasopharyngeal wall extending into right posterior/lateral posterior wall.     Procedure    12/26/2017: port and PEG tube placement     12/12/2017 -  Chemotherapy    The patient had palonosetron (ALOXI) injection 0.25 mg, 0.25 mg, Intravenous,  Once, 1 of 3 cycles Administration: 0.25 mg  (12/28/2017) CISplatin (PLATINOL) 196 mg in sodium chloride 0.9 % 500 mL chemo infusion, 100 mg/m2 = 196 mg, Intravenous,  Once, 1 of 3 cycles Dose modification: 80 mg/m2 (original dose 100 mg/m2, Cycle 2, Reason: Dose not tolerated, Comment: Neutropenia) Administration: 196 mg (12/28/2017) fosaprepitant (EMEND) 150 mg, dexamethasone (DECADRON) 12 mg in sodium chloride 0.9 % 145 mL IVPB, , Intravenous,  Once, 1 of 3 cycles Administration:  (12/28/2017)  for chemotherapy treatment.      REVIEW OF SYSTEMS:   Constitutional: ( - ) fevers, ( - )  chills , ( - ) night sweats Eyes: ( - ) blurriness of vision, ( - ) double vision, ( - ) watery eyes Ears, nose, mouth, throat, and face: ( - ) mucositis, ( - ) sore throat Respiratory: ( - ) cough, ( - ) dyspnea, ( - ) wheezes Cardiovascular: ( - ) palpitation, ( - ) chest discomfort, ( - ) lower extremity swelling Gastrointestinal:  ( + ) nausea, ( - ) heartburn, ( - ) change in bowel habits Skin: ( - ) abnormal skin rashes Lymphatics: ( - ) new lymphadenopathy, ( - ) easy bruising Neurological: ( - ) numbness, ( - ) tingling, ( - ) new weaknesses Behavioral/Psych: ( - ) mood change, ( - ) new changes  All other systems were reviewed with the patient and are negative.  I have reviewed the past medical history, past surgical history,  social history and family history with the patient and they are unchanged from previous note.  ALLERGIES:  has No Known Allergies.  MEDICATIONS:  Current Outpatient Medications  Medication Sig Dispense Refill  . dexamethasone (DECADRON) 4 MG tablet Take 2 tablets by mouth once a day on the day after chemotherapy and then take 2 tablets two times a day for 2 days. Take with food. 30 tablet 1  . lidocaine (XYLOCAINE) 2 % solution Patient: Mix 1part 2% viscous lidocaine, 1part H20. Swish & swallow 51m of diluted mixture, 360m before meals and at bedtime, up to QID 100 mL 5  . lidocaine-prilocaine (EMLA) cream Apply  to affected area once 30 g 3  . LORazepam (ATIVAN) 0.5 MG tablet Take 1 tablet (0.5 mg total) by mouth every 6 (six) hours as needed (Nausea or vomiting). 30 tablet 0  . Morphine Sulfate (MORPHINE CONCENTRATE) 10 mg / 0.5 ml concentrated solution Take 0.5 mLs (10 mg total) by mouth every 3 (three) hours as needed for severe pain. 120 mL 0  . Nutritional Supplements (FEEDING SUPPLEMENT, OSMOLITE 1.5 CAL,) LIQD Begin 1.5 bottles Osmolite 1.5 via PEG QID with 60 mL free water before and after bolus feedings. Mix 30 mL Promod with 60 mL water BID and put into PEG. Flush with 30 mL water. Do not mix Promod with Osmolite 1.5 6 Bottle 0  . ondansetron (ZOFRAN) 8 MG tablet Take 1 tablet (8 mg total) by mouth every 8 (eight) hours as needed. Start on the third day after chemotherapy. 60 tablet 1  . oxyCODONE-acetaminophen (PERCOCET/ROXICET) 5-325 MG tablet   0  . prochlorperazine (COMPAZINE) 10 MG tablet Take 1 tablet (10 mg total) by mouth every 6 (six) hours as needed (Nausea or vomiting). 30 tablet 1  . sodium fluoride (PREVIDENT 5000 PLUS) 1.1 % CREA dental cream Apply cream to tooth brush. Brush teeth for 2 minutes. Spit out excess. DO NOT rinse afterwards. Repeat nightly. 1 Tube prn   No current facility-administered medications for this visit.     PHYSICAL EXAMINATION: ECOG PERFORMANCE STATUS: 0 - Asymptomatic  There were no vitals filed for this visit. There is no height or weight on file to calculate BMI.  There were no vitals filed for this visit.  GENERAL: alert, no distress and comfortable SKIN: skin color, texture, turgor are normal, no rashes or significant lesions EYES: conjunctiva are pink and non-injected, sclera clear OROPHARYNX: no exudate, no erythema; lips, buccal mucosa, and tongue normal  NECK: supple, LYMPH:  Right cervical LN measuring ~3x3cm; no other palpable lymphadenopathy in the cervical or axillary   LUNGS: clear to auscultation and percussion with normal breathing  effort HEART: regular rate & rhythm and no murmurs and no lower extremity edema ABDOMEN: soft, non-tender, non-distended, normal bowel sounds Musculoskeletal: no cyanosis of digits and no clubbing  PSYCH: alert & oriented x 3, fluent speech NEURO: no focal motor/sensory deficits  LABORATORY DATA:  I have reviewed the data as listed    Component Value Date/Time   NA 139 01/16/2018 1530   K 3.8 01/16/2018 1530   CL 103 01/16/2018 1530   CO2 29 01/16/2018 1530   GLUCOSE 177 (H) 01/16/2018 1530   BUN 14 01/16/2018 1530   CREATININE 1.13 01/16/2018 1530   CALCIUM 9.2 01/16/2018 1530   PROT 7.3 12/12/2017 1426   ALBUMIN 4.2 12/12/2017 1426   AST 13 (L) 12/12/2017 1426   ALT 20 12/12/2017 1426   ALKPHOS 75 12/12/2017 1426   BILITOT 0.5  12/12/2017 1426   GFRNONAA >60 01/16/2018 1530   GFRAA >60 01/16/2018 1530    No results found for: SPEP, UPEP  Lab Results  Component Value Date   WBC 1.5 (L) 01/16/2018   NEUTROABS 0.9 (L) 01/16/2018   HGB 11.7 (L) 01/16/2018   HCT 35.8 (L) 01/16/2018   MCV 85.6 01/16/2018   PLT 160 01/16/2018      Chemistry      Component Value Date/Time   NA 139 01/16/2018 1530   K 3.8 01/16/2018 1530   CL 103 01/16/2018 1530   CO2 29 01/16/2018 1530   BUN 14 01/16/2018 1530   CREATININE 1.13 01/16/2018 1530      Component Value Date/Time   CALCIUM 9.2 01/16/2018 1530   ALKPHOS 75 12/12/2017 1426   AST 13 (L) 12/12/2017 1426   ALT 20 12/12/2017 1426   BILITOT 0.5 12/12/2017 1426       RADIOGRAPHIC STUDIES: I have personally reviewed the radiological images as listed below and agreed with the findings in the report. Ir Gastrostomy Tube Mod Sed  Result Date: 12/26/2017 INDICATION: History of tonsillar cancer, in need of gastrostomy tube placement prior to the initiation of chemoradiation. EXAM: PULL TROUGH GASTROSTOMY TUBE PLACEMENT COMPARISON:  PET-CT-11/28/2017 MEDICATIONS: Ancef 2 gm IV; Antibiotics were administered within 1 hour of the  procedure. Glucagon 1 mg IV CONTRAST:  10 cc Isovue-300 = administered into the gastric lumen. ANESTHESIA/SEDATION: Moderate (conscious) sedation was employed during this procedure. A total of Versed 1.5 mg and Fentanyl 50 mcg was administered intravenously. Moderate Sedation Time: 12 minutes. The patient's level of consciousness and vital signs were monitored continuously by radiology nursing throughout the procedure under my direct supervision. FLUOROSCOPY TIME:  1 minute, 48 seconds (90 mGy) COMPLICATIONS: None immediate. PROCEDURE: Informed written consent was obtained from the patient following explanation of the procedure, risks, benefits and alternatives. A time out was performed prior to the initiation of the procedure. Ultrasound scanning was performed to demarcate the edge of the left lobe of the liver. Maximal barrier sterile technique utilized including caps, mask, sterile gowns, sterile gloves, large sterile drape, hand hygiene and Betadine prep. The left upper quadrant was sterilely prepped and draped. An oral gastric catheter was inserted into the stomach under fluoroscopy. The existing nasogastric feeding tube was removed. The left costal margin and barium / air opacified transverse colon were identified and avoided. Air was injected into the stomach for insufflation and visualization under fluoroscopy. Under sterile conditions a 17 gauge trocar needle was utilized to access the stomach percutaneously beneath the left subcostal margin after the overlying soft tissues were anesthetized with 1% Lidocaine with epinephrine. Needle position was confirmed within the stomach with aspiration of air and injection of small amount of contrast. A single T tack was deployed for gastropexy. Over an Amplatz guide wire, a 9-French sheath was inserted into the stomach. A snare device was utilized to capture the oral gastric catheter. The snare device was pulled retrograde from the stomach up the esophagus and out the  oropharynx. The 20-French pull-through gastrostomy was connected to the snare device and pulled antegrade through the oropharynx down the esophagus into the stomach and then through the percutaneous tract external to the patient. The gastrostomy was assembled externally. Contrast injection confirms position in the stomach. Several spot radiographic images were obtained in various obliquities for documentation. The patient tolerated procedure well without immediate post procedural complication. FINDINGS: After successful fluoroscopic guided placement, the gastrostomy tube is appropriately positioned with  internal disc against the ventral aspect of the gastric lumen. IMPRESSION: Successful fluoroscopic insertion of a 20-French pull-through gastrostomy tube. The gastrostomy may be used immediately for medication administration and in 24 hrs for the initiation of feeds. Electronically Signed   By: Sandi Mariscal M.D.   On: 12/26/2017 15:07   Ir Imaging Guided Port Insertion  Result Date: 12/26/2017 INDICATION: History of tonsillar cancer, in need of durable intravenous access for chemotherapy administration. EXAM: IMPLANTED PORT A CATH PLACEMENT WITH ULTRASOUND AND FLUOROSCOPIC GUIDANCE COMPARISON:  PET-CT-11/28/2017; contrast-enhanced neck CT-11/07/2017 MEDICATIONS: Ancef 2 gm IV; The antibiotic was administered within an appropriate time interval prior to skin puncture. ANESTHESIA/SEDATION: Moderate (conscious) sedation was employed during this procedure. A total of Versed 2.5 mg and Fentanyl 150 mcg was administered intravenously. Moderate Sedation Time: 28 minutes. The patient's level of consciousness and vital signs were monitored continuously by radiology nursing throughout the procedure under my direct supervision. CONTRAST:  None FLUOROSCOPY TIME:  30 seconds (0.6 mGy) COMPLICATIONS: None immediate. PROCEDURE: The procedure, risks, benefits, and alternatives were explained to the patient. Questions regarding  the procedure were encouraged and answered. The patient understands and consents to the procedure. The right neck and chest were prepped with chlorhexidine in a sterile fashion, and a sterile drape was applied covering the operative field. Maximum barrier sterile technique with sterile gowns and gloves were used for the procedure. A timeout was performed prior to the initiation of the procedure. Local anesthesia was provided with 1% lidocaine with epinephrine. After creating a small venotomy incision, a micropuncture kit was utilized to access the internal jugular vein. Real-time ultrasound guidance was utilized for vascular access including the acquisition of a permanent ultrasound image documenting patency of the accessed vessel. The microwire was utilized to measure appropriate catheter length. A subcutaneous port pocket was then created along the upper chest wall utilizing a combination of sharp and blunt dissection. The pocket was irrigated with sterile saline. A single lumen thin power injectable port was chosen for placement. The 8 Fr catheter was tunneled from the port pocket site to the venotomy incision. The port was placed in the pocket. The external catheter was trimmed to appropriate length. At the venotomy, an 8 Fr peel-away sheath was placed over a guidewire under fluoroscopic guidance. The catheter was then placed through the sheath and the sheath was removed. Final catheter positioning was confirmed and documented with a fluoroscopic spot radiograph. The port was accessed with a Huber needle, aspirated and flushed with heparinized saline. The venotomy site was closed with an interrupted 4-0 Vicryl suture. The port pocket incision was closed with interrupted 2-0 Vicryl suture and the skin was opposed with a running subcuticular 4-0 Vicryl suture. Dermabond and Steri-strips were applied to both incisions. Dressings were placed. The patient tolerated the procedure well without immediate post procedural  complication. FINDINGS: After catheter placement, the tip lies within the superior cavoatrial junction. The catheter aspirates and flushes normally and is ready for immediate use. IMPRESSION: Successful placement of a right internal jugular approach power injectable Port-A-Cath. The catheter is ready for immediate use. Electronically Signed   By: Sandi Mariscal M.D.   On: 12/26/2017 15:06

## 2018-01-24 ENCOUNTER — Ambulatory Visit
Admission: RE | Admit: 2018-01-24 | Discharge: 2018-01-24 | Disposition: A | Discharge status: Home / Self Care | Payer: BLUE CROSS/BLUE SHIELD | Source: Ambulatory Visit | Attending: Radiation Oncology | Admitting: Radiation Oncology

## 2018-01-24 ENCOUNTER — Encounter: Payer: Self-pay | Admitting: Hematology

## 2018-01-24 ENCOUNTER — Inpatient Hospital Stay (HOSPITAL_BASED_OUTPATIENT_CLINIC_OR_DEPARTMENT_OTHER): Payer: BLUE CROSS/BLUE SHIELD | Admitting: Hematology

## 2018-01-24 VITALS — BP 112/70 | HR 72 | Temp 98.1°F | Resp 18 | Wt 167.0 lb

## 2018-01-24 DIAGNOSIS — Z79899 Other long term (current) drug therapy: Secondary | ICD-10-CM

## 2018-01-24 DIAGNOSIS — N179 Acute kidney failure, unspecified: Secondary | ICD-10-CM

## 2018-01-24 DIAGNOSIS — R51 Headache: Secondary | ICD-10-CM

## 2018-01-24 DIAGNOSIS — E860 Dehydration: Secondary | ICD-10-CM

## 2018-01-24 DIAGNOSIS — R110 Nausea: Secondary | ICD-10-CM

## 2018-01-24 DIAGNOSIS — R1310 Dysphagia, unspecified: Secondary | ICD-10-CM | POA: Diagnosis not present

## 2018-01-24 DIAGNOSIS — D701 Agranulocytosis secondary to cancer chemotherapy: Secondary | ICD-10-CM | POA: Diagnosis not present

## 2018-01-24 DIAGNOSIS — E46 Unspecified protein-calorie malnutrition: Secondary | ICD-10-CM

## 2018-01-24 DIAGNOSIS — T451X5S Adverse effect of antineoplastic and immunosuppressive drugs, sequela: Secondary | ICD-10-CM

## 2018-01-24 DIAGNOSIS — C090 Malignant neoplasm of tonsillar fossa: Secondary | ICD-10-CM | POA: Diagnosis not present

## 2018-01-24 DIAGNOSIS — C770 Secondary and unspecified malignant neoplasm of lymph nodes of head, face and neck: Secondary | ICD-10-CM

## 2018-01-24 DIAGNOSIS — K1231 Oral mucositis (ulcerative) due to antineoplastic therapy: Secondary | ICD-10-CM

## 2018-01-24 DIAGNOSIS — T451X5A Adverse effect of antineoplastic and immunosuppressive drugs, initial encounter: Secondary | ICD-10-CM

## 2018-01-24 DIAGNOSIS — Z931 Gastrostomy status: Secondary | ICD-10-CM | POA: Diagnosis not present

## 2018-01-24 DIAGNOSIS — D6959 Other secondary thrombocytopenia: Secondary | ICD-10-CM | POA: Insufficient documentation

## 2018-01-24 DIAGNOSIS — R634 Abnormal weight loss: Secondary | ICD-10-CM

## 2018-01-25 ENCOUNTER — Ambulatory Visit
Admission: RE | Admit: 2018-01-25 | Discharge: 2018-01-25 | Disposition: A | Discharge status: Home / Self Care | Payer: BLUE CROSS/BLUE SHIELD | Source: Ambulatory Visit | Attending: Radiation Oncology | Admitting: Radiation Oncology

## 2018-01-25 ENCOUNTER — Inpatient Hospital Stay: Payer: BLUE CROSS/BLUE SHIELD

## 2018-01-25 ENCOUNTER — Other Ambulatory Visit: Payer: Self-pay | Admitting: Medical

## 2018-01-25 VITALS — BP 113/71 | HR 83 | Temp 98.1°F | Resp 18

## 2018-01-25 DIAGNOSIS — C090 Malignant neoplasm of tonsillar fossa: Secondary | ICD-10-CM

## 2018-01-25 DIAGNOSIS — R1310 Dysphagia, unspecified: Secondary | ICD-10-CM | POA: Diagnosis not present

## 2018-01-25 MED ORDER — SODIUM CHLORIDE 0.9 % IV SOLN
Freq: Once | INTRAVENOUS | Status: AC
  Administered 2018-01-25: 10:00:00 via INTRAVENOUS
  Filled 2018-01-25: qty 250

## 2018-01-25 MED ORDER — POTASSIUM CHLORIDE 2 MEQ/ML IV SOLN
Freq: Once | INTRAVENOUS | Status: AC
  Administered 2018-01-25: 10:00:00 via INTRAVENOUS
  Filled 2018-01-25: qty 10

## 2018-01-25 MED ORDER — HEPARIN SOD (PORK) LOCK FLUSH 100 UNIT/ML IV SOLN
500.0000 [IU] | Freq: Once | INTRAVENOUS | Status: AC | PRN
  Administered 2018-01-25: 500 [IU]
  Filled 2018-01-25: qty 5

## 2018-01-25 MED ORDER — SODIUM CHLORIDE 0.9% FLUSH
10.0000 mL | INTRAVENOUS | Status: DC | PRN
  Administered 2018-01-25: 10 mL
  Filled 2018-01-25: qty 10

## 2018-01-25 MED ORDER — PALONOSETRON HCL INJECTION 0.25 MG/5ML
INTRAVENOUS | Status: AC
  Filled 2018-01-25: qty 5

## 2018-01-25 MED ORDER — SODIUM CHLORIDE 0.9 % IV SOLN
80.0000 mg/m2 | Freq: Once | INTRAVENOUS | Status: AC
  Administered 2018-01-25: 157 mg via INTRAVENOUS
  Filled 2018-01-25: qty 157

## 2018-01-25 MED ORDER — PALONOSETRON HCL INJECTION 0.25 MG/5ML
0.2500 mg | Freq: Once | INTRAVENOUS | Status: AC
  Administered 2018-01-25: 0.25 mg via INTRAVENOUS

## 2018-01-25 MED ORDER — SODIUM CHLORIDE 0.9 % IV SOLN
Freq: Once | INTRAVENOUS | Status: AC
  Administered 2018-01-25: 13:00:00 via INTRAVENOUS
  Filled 2018-01-25: qty 5

## 2018-01-25 NOTE — Patient Instructions (Signed)
[facility] Discharge Instructions for Patients Receiving Chemotherapy  Today you received the following chemotherapy agents Cisplatin  To help prevent nausea and vomiting after your treatment, we encourage you to take your nausea medication as directed  If you develop nausea and vomiting that is not controlled by your nausea medication, call the clinic.   BELOW ARE SYMPTOMS THAT SHOULD BE REPORTED IMMEDIATELY:  *FEVER GREATER THAN 100.5 F  *CHILLS WITH OR WITHOUT FEVER  NAUSEA AND VOMITING THAT IS NOT CONTROLLED WITH YOUR NAUSEA MEDICATION  *UNUSUAL SHORTNESS OF BREATH  *UNUSUAL BRUISING OR BLEEDING  TENDERNESS IN MOUTH AND THROAT WITH OR WITHOUT PRESENCE OF ULCERS  *URINARY PROBLEMS  *BOWEL PROBLEMS  UNUSUAL RASH Items with * indicate a potential emergency and should be followed up as soon as possible.  Feel free to call the clinic should you have any questions or concerns. The clinic phone number is [phone].  Please show the CHEMO ALERT CARD at check-in to the Emergency Department and triage nurse.   

## 2018-01-27 ENCOUNTER — Encounter: Payer: Self-pay | Admitting: Hematology

## 2018-01-28 ENCOUNTER — Ambulatory Visit
Admission: RE | Admit: 2018-01-28 | Discharge: 2018-01-28 | Disposition: A | Discharge status: Home / Self Care | Payer: BLUE CROSS/BLUE SHIELD | Source: Ambulatory Visit | Attending: Radiation Oncology | Admitting: Radiation Oncology

## 2018-01-28 DIAGNOSIS — R1310 Dysphagia, unspecified: Secondary | ICD-10-CM | POA: Diagnosis not present

## 2018-01-28 DIAGNOSIS — C090 Malignant neoplasm of tonsillar fossa: Secondary | ICD-10-CM

## 2018-01-28 MED ORDER — PROCHLORPERAZINE MALEATE 10 MG PO TABS
10.0000 mg | ORAL_TABLET | Freq: Once | ORAL | Status: AC
  Administered 2018-01-28: 10 mg via ORAL
  Filled 2018-01-28: qty 1

## 2018-01-29 ENCOUNTER — Inpatient Hospital Stay: Payer: BLUE CROSS/BLUE SHIELD | Admitting: Nutrition

## 2018-01-29 ENCOUNTER — Telehealth: Payer: Self-pay | Admitting: *Deleted

## 2018-01-29 ENCOUNTER — Encounter: Payer: Self-pay | Admitting: Nutrition

## 2018-01-29 ENCOUNTER — Ambulatory Visit
Admission: RE | Admit: 2018-01-29 | Discharge: 2018-01-29 | Disposition: A | Discharge status: Home / Self Care | Payer: BLUE CROSS/BLUE SHIELD | Source: Ambulatory Visit | Attending: Radiation Oncology | Admitting: Radiation Oncology

## 2018-01-29 DIAGNOSIS — R1310 Dysphagia, unspecified: Secondary | ICD-10-CM | POA: Diagnosis not present

## 2018-01-29 NOTE — Progress Notes (Signed)
Patient's wife called to cancel nutrition appointment for today.  She reports Nicole Kindred feels very nauseated and weak.  She confirms patient has been taking his antiemetics on schedule.  She reports she has been giving 1 L IV fluids daily at home.  He has not been able to tolerate tube feeding at goal rate since last chemotherapy and is only giving 3 to 4-1/2 bottles of Osmolite 1.5 daily instead of 6.  Patient's wife reports weight loss.  Recommended patient decrease Osmolite 1.5 to 1 bottle given over 10 minutes 6 times daily. Recommended she continue antiemetics as prescribed by physician.  Patient is scheduled for labs and physician visit tomorrow.  Patient's wife will follow-up with me to discuss tube feeding changes as needed.  **Disclaimer: This note was dictated with voice recognition software. Similar sounding words can inadvertently be transcribed and this note may contain transcription errors which may not have been corrected upon publication of note.**

## 2018-01-29 NOTE — Telephone Encounter (Signed)
Reviewed pt's MyChart message, and also call notice from after hours telehealth that pt called over the weekend.  Attempted to call pt yesterday 11/11 without answer.  Left message requesting a call back from pt.  No return call received. Spoke with pt today, and was informed that nausea symptom has resolved, still feels weak but able to eat and drink adequately per pt.  Offered pt to come in for [facility] visit and to receive IVF; however, pt declined.   Stated he did inform Dr. Lanell Persons in XRT yesterday of hiccups, nausea symptoms.  Dr. Lanell Persons had given script for hiccups, and instructed pt to stop dexamethasone per pt. Pt has office visit with Dr. Maylon Peppers on  01/30/18. Pt understood to call office back with any concerns.

## 2018-01-29 NOTE — Telephone Encounter (Signed)
Thank you for the update and calling the patient.  Thomas Mccall

## 2018-01-30 ENCOUNTER — Inpatient Hospital Stay: Payer: BLUE CROSS/BLUE SHIELD

## 2018-01-30 ENCOUNTER — Other Ambulatory Visit: Payer: Self-pay | Admitting: *Deleted

## 2018-01-30 ENCOUNTER — Other Ambulatory Visit: Payer: Self-pay | Admitting: Hematology

## 2018-01-30 ENCOUNTER — Inpatient Hospital Stay (HOSPITAL_BASED_OUTPATIENT_CLINIC_OR_DEPARTMENT_OTHER): Payer: BLUE CROSS/BLUE SHIELD | Admitting: Hematology

## 2018-01-30 ENCOUNTER — Encounter: Payer: Self-pay | Admitting: Hematology

## 2018-01-30 ENCOUNTER — Ambulatory Visit
Admission: RE | Admit: 2018-01-30 | Discharge: 2018-01-30 | Disposition: A | Discharge status: Home / Self Care | Payer: BLUE CROSS/BLUE SHIELD | Source: Ambulatory Visit | Attending: Radiation Oncology | Admitting: Radiation Oncology

## 2018-01-30 VITALS — BP 125/80 | HR 86 | Temp 97.6°F | Resp 18 | Ht 70.0 in | Wt 160.9 lb

## 2018-01-30 DIAGNOSIS — C090 Malignant neoplasm of tonsillar fossa: Secondary | ICD-10-CM | POA: Diagnosis not present

## 2018-01-30 DIAGNOSIS — K219 Gastro-esophageal reflux disease without esophagitis: Secondary | ICD-10-CM

## 2018-01-30 DIAGNOSIS — Z931 Gastrostomy status: Secondary | ICD-10-CM

## 2018-01-30 DIAGNOSIS — T451X5S Adverse effect of antineoplastic and immunosuppressive drugs, sequela: Secondary | ICD-10-CM

## 2018-01-30 DIAGNOSIS — R1310 Dysphagia, unspecified: Secondary | ICD-10-CM | POA: Diagnosis not present

## 2018-01-30 DIAGNOSIS — E46 Unspecified protein-calorie malnutrition: Secondary | ICD-10-CM

## 2018-01-30 DIAGNOSIS — E860 Dehydration: Secondary | ICD-10-CM

## 2018-01-30 DIAGNOSIS — C770 Secondary and unspecified malignant neoplasm of lymph nodes of head, face and neck: Secondary | ICD-10-CM | POA: Diagnosis not present

## 2018-01-30 DIAGNOSIS — R110 Nausea: Secondary | ICD-10-CM

## 2018-01-30 DIAGNOSIS — T451X5A Adverse effect of antineoplastic and immunosuppressive drugs, initial encounter: Secondary | ICD-10-CM

## 2018-01-30 DIAGNOSIS — D701 Agranulocytosis secondary to cancer chemotherapy: Secondary | ICD-10-CM | POA: Insufficient documentation

## 2018-01-30 DIAGNOSIS — K1231 Oral mucositis (ulcerative) due to antineoplastic therapy: Secondary | ICD-10-CM

## 2018-01-30 DIAGNOSIS — Z95828 Presence of other vascular implants and grafts: Secondary | ICD-10-CM

## 2018-01-30 DIAGNOSIS — Z79899 Other long term (current) drug therapy: Secondary | ICD-10-CM

## 2018-01-30 DIAGNOSIS — N179 Acute kidney failure, unspecified: Secondary | ICD-10-CM

## 2018-01-30 DIAGNOSIS — R634 Abnormal weight loss: Secondary | ICD-10-CM

## 2018-01-30 LAB — BASIC METABOLIC PANEL - CANCER CENTER ONLY
Anion gap: 8 (ref 5–15)
BUN: 20 mg/dL (ref 6–20)
CO2: 29 mmol/L (ref 22–32)
Calcium: 8.9 mg/dL (ref 8.9–10.3)
Chloride: 98 mmol/L (ref 98–111)
Creatinine: 1.28 mg/dL — ABNORMAL HIGH (ref 0.61–1.24)
GFR, Est AFR Am: >60 mL/min (ref >60)
GFR, Estimated: 60 mL/min — ABNORMAL LOW (ref >60)
Glucose, Bld: 171 mg/dL — ABNORMAL HIGH (ref 70–99)
Potassium: 3.7 mmol/L (ref 3.5–5.1)
Sodium: 135 mmol/L (ref 135–145)

## 2018-01-30 LAB — MAGNESIUM: Magnesium: 2 mg/dL (ref 1.7–2.4)

## 2018-01-30 LAB — CBC WITH DIFFERENTIAL (CANCER CENTER ONLY)
Abs Immature Granulocytes: 0.01 10*3/uL (ref 0.00–0.07)
Basophils Absolute: 0 10*3/uL (ref 0.0–0.1)
Basophils Relative: 0 %
Eosinophils Absolute: 0 10*3/uL (ref 0.0–0.5)
Eosinophils Relative: 0 %
HCT: 37.4 % — ABNORMAL LOW (ref 39.0–52.0)
Hemoglobin: 12.6 g/dL — ABNORMAL LOW (ref 13.0–17.0)
Immature Granulocytes: 0 %
Lymphocytes Relative: 3 %
Lymphs Abs: 0.1 10*3/uL — ABNORMAL LOW (ref 0.7–4.0)
MCH: 28 pg (ref 26.0–34.0)
MCHC: 33.7 g/dL (ref 30.0–36.0)
MCV: 83.1 fL (ref 80.0–100.0)
Monocytes Absolute: 0.4 10*3/uL (ref 0.1–1.0)
Monocytes Relative: 13 %
Neutro Abs: 2.9 10*3/uL (ref 1.7–7.7)
Neutrophils Relative %: 84 %
Platelet Count: 173 10*3/uL (ref 150–400)
RBC: 4.5 MIL/uL (ref 4.22–5.81)
RDW: 12.3 % (ref 11.5–15.5)
WBC Count: 3.4 10*3/uL — ABNORMAL LOW (ref 4.0–10.5)
nRBC: 0 % (ref 0.0–0.2)

## 2018-01-30 MED ORDER — HEPARIN SOD (PORK) LOCK FLUSH 100 UNIT/ML IV SOLN
500.0000 [IU] | Freq: Once | INTRAVENOUS | Status: AC | PRN
  Administered 2018-01-30: 500 [IU]
  Filled 2018-01-30: qty 5

## 2018-01-30 MED ORDER — OMEPRAZOLE 40 MG PO CPDR: 40.0000 mg | DELAYED_RELEASE_CAPSULE | Freq: Every day | ORAL | 5 refills | Status: DC

## 2018-01-30 MED ORDER — LORAZEPAM 0.5 MG PO TABS: 0.5000 mg | ORAL_TABLET | Freq: Four times a day (QID) | ORAL | 0 refills | Status: DC | PRN

## 2018-01-30 MED ORDER — SODIUM CHLORIDE 0.9% FLUSH
10.0000 mL | INTRAVENOUS | Status: DC | PRN
  Administered 2018-01-30: 10 mL
  Filled 2018-01-30: qty 10

## 2018-01-30 MED FILL — OMEPRAZOLE 40 MG CPDR: 40 | 30 days supply | Qty: 30 | Fill #0

## 2018-01-30 MED FILL — PROCHLORPERAZINE 10 MG TAB: 10 | 8 days supply | Qty: 30 | Fill #1

## 2018-01-30 MED FILL — LORazepam 0.5 MG TABS: 0.5 | 7 days supply | Qty: 30 | Fill #0

## 2018-01-30 NOTE — Addendum Note (Signed)
Addended by: Will Bonnet on: 01/30/2018 03:58 PM   Modules accepted: Miquel Dunn

## 2018-01-30 NOTE — Progress Notes (Signed)
[facility] OFFICE PROGRESS NOTE  Patient Care Team: Martinique, Betty G, MD as PCP - General (Family Medicine) Eppie Gibson, MD as Attending Physician (Radiation Oncology) Tish Men, MD as Consulting Physician (Hematology) Leota Sauers, RN as Oncology Nurse Navigator (Oncology) Karie Mainland, RD as Dietitian (Nutrition) Valentino Saxon [place], CCC-SLP as Speech Language Pathologist (Speech Pathology) Wynelle Beckmann, Melodie Bouillon, PT as Physical Therapist (Physical Therapy)  HEME/ONC OVERVIEW: 1. 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, p16+ -For detailed oncologic hx, see below -Concurrent chemoRT started on 12/28/2017; received 2 doses of cisplatin so far  -Dose reduced to 80mg /m2 starting with 2nd dose due to Grade 3 neutropenia  -Dose #3 scheduled on 02/15/2018   Chemotherapy-induced leukopenia -Stable leukopenia today; no neutropenia  -Patient denies any symptom of infection -We will continue monitor it for now  Chemotherapy-induced nausea -Patient reports worsening nausea since the second dose of cisplatin -Relatively controlled with PRN Zofran, Compazine, dexamethasone and Ativan -Continue anti-emetics as prescribed  AKI -Cr 1.28 today, slightly worse than last week -Secondary to dehydration and cisplatin-related nephrotoxicity -He is receiving home IV fluid (M-F, 1-2 L/day) -Weekly renal function monitoring   Protein malnutrition -The patient is currently taking approximately 4 cans of Osmolite per day -He has lost approximately 7 pounds since last week -Home IV fluid initiated due to dehydration -I encouraged the patient to  adhere to the enteral nutrition as prescribed by the dietician   Treatment-related mucositis -Patient reports mild to moderate pain due to mouth ulcers and mucositis -I encouraged patient to use liquid morphine as needed  GERD -I have prescribed omeprazole 40mg  daily for acid reflux   All questions were answered. The patient knows to call the clinic with any problems, questions or concerns. No barriers to learning was detected.  A total of more than 40 minutes were spent face-to-face with the patient during this encounter and over half of that time was spent on counseling and coordination of care as outlined above.   Tish Men, MD 01/30/2018 4:19 PM  CHIEF COMPLAINT: "I am feeling pretty bad "  INTERVAL HISTORY: Mr. Morera returns to clinic for toxicity check after second cycle of cisplatin.  He reports that he has had worsening fatigue and nausea, for which he has been alternating Zofran, Compazine, and Ativan (in addition to scheduled dexamethasone) with overall adequate control.  He denies any vomiting.  He has not been able to take anything by mouth due to moderate pain from mouth ulcers.  He is taking in 4 cans of nutrition by feeding tube per day.  He reports occasional ringing in the ear, but denies any persistent tinnitus or change in hearing.  He denies any fever, chill, night sweats, chest pain, dyspnea, diarrhea, abnormal bleeding or bruising.  SUMMARY OF ONCOLOGIC HISTORY:   Malignant neoplasm of tonsillar fossa ([place])   10/30/2017 Miscellaneous    Presented to Dr. Redmond Baseman with 3 months of R neck mass    11/07/2017 Imaging    CT neck w/ contrast:  1. Right tonsillar mass measures up to 2.6 cm, highly concerning for a squamous cell carcinoma. 2.  Enlarged homogeneous right level 2 lymph node measures up to 3.9 cm, consistent with metastatic disease. 3. Adjacent right level 3 lymph node measures 1.1 cm. 4. Lucency at C4 on the left is likely degenerative. Metastatic disease is not  excluded. Please correlate with findings on PET scan if used for further evaluation.    11/12/2017 Procedure    R tonsillar biopsy by Dr. Janace Hoard    11/12/2017 Pathology Results    Accession: [id number] Invasive squamous cell carcinoma; p16 diffusely positive    11/28/2017 Imaging    PET scan: 1. Right tonsillar fossa mass is intensely hypermetabolic compatible with primary neoplasm. 2. Solitary enlarged and hypermetabolic right level 2 cervical lymph node compatible with metastatic adenopathy. 3. No evidence for FDG avid hypermetabolic distant metastasis.    11/30/2017 Cancer Staging    Staging form: Pharynx - HPV-Mediated Oropharynx, AJCC 8th Edition - Clinical stage from 11/30/2017: Stage I (cT2, cN1, cM0, p16+) - Signed by Eppie Gibson, MD on 11/30/2017    12/07/2017 Miscellaneous    [facility] to [facility] recommended against surgical resection due to the tumor extending into the right inferior nasopharynx and posterior soft palate, which would require resection of almost 1/2 of the soft palate and the right nasopharyngeal wall extending into right posterior/lateral posterior wall.     Procedure    12/26/2017: port and PEG tube placement     12/12/2017 -  Chemotherapy    The patient had palonosetron (ALOXI) injection 0.25 mg, 0.25 mg, Intravenous,  Once, 2 of 3 cycles Administration: 0.25 mg (12/28/2017), 0.25 mg (01/25/2018) CISplatin (PLATINOL) 196 mg in sodium chloride 0.9 % 500 mL chemo infusion, 100 mg/m2 = 196 mg, Intravenous,  Once, 2 of 3 cycles Dose modification: 80 mg/m2 (original dose 100 mg/m2, Cycle 2, Reason: Dose not tolerated, Comment: Neutropenia) Administration: 196 mg (12/28/2017), 157 mg (01/25/2018) fosaprepitant (EMEND) 150 mg, dexamethasone (DECADRON) 12 mg in sodium chloride 0.9 % 145 mL IVPB, , Intravenous,  Once, 2 of 3 cycles Administration:  (12/28/2017),  (01/25/2018)  for chemotherapy treatment.      REVIEW OF SYSTEMS:   Constitutional: ( - )  fevers, ( - )  chills , ( - ) night sweats Eyes: ( - ) blurriness of vision, ( - ) double vision, ( - ) watery eyes Ears, nose, mouth, throat, and face: ( + ) mucositis, ( + ) sore throat Respiratory: ( - ) cough, ( - ) dyspnea, ( - ) wheezes Cardiovascular: ( - ) palpitation, ( - ) chest discomfort, ( - ) lower extremity swelling Gastrointestinal:  ( + ) nausea, ( + ) heartburn, ( - ) change in bowel habits Skin: ( - ) abnormal skin rashes Lymphatics: ( - ) new lymphadenopathy, ( - ) easy bruising Neurological: ( - ) numbness, ( - ) tingling, ( - ) new weaknesses Behavioral/Psych: ( - ) mood change, ( - ) new changes  All other systems were reviewed with the patient and are negative.  I have reviewed the past medical history, past surgical history, social history and family history with the patient and they are unchanged from previous note.  ALLERGIES:  has No Known Allergies.  MEDICATIONS:  Current Outpatient Medications  Medication Sig Dispense Refill  . Baclofen 5 MG TABS Take 5 mg by mouth every 8 (eight) hours as needed.  0  . lidocaine (XYLOCAINE) 2 % solution Patient: Mix 1part 2% viscous lidocaine, 1part H20. Swish & swallow 101mL of diluted mixture, 53min before meals  and at bedtime, up to QID 100 mL 5  . lidocaine-prilocaine (EMLA) cream Apply to affected area once 30 g 3  . LORazepam (ATIVAN) 0.5 MG tablet Take 1 tablet (0.5 mg total) by mouth every 6 (six) hours as needed (Nausea or vomiting). 30 tablet 0  . Nutritional Supplements (FEEDING SUPPLEMENT, OSMOLITE 1.5 CAL,) LIQD Begin 1.5 bottles Osmolite 1.5 via PEG QID with 60 mL free water before and after bolus feedings. Mix 30 mL Promod with 60 mL water BID and put into PEG. Flush with 30 mL water. Do not mix Promod with Osmolite 1.5 6 Bottle 0  . ondansetron (ZOFRAN) 8 MG tablet Take 1 tablet (8 mg total) by mouth every 8 (eight) hours as needed. Start on the third day after chemotherapy. 60 tablet 1  . prochlorperazine  (COMPAZINE) 10 MG tablet Take 1 tablet (10 mg total) by mouth every 6 (six) hours as needed (Nausea or vomiting). 30 tablet 1  . sodium fluoride (PREVIDENT 5000 PLUS) 1.1 % CREA dental cream Apply cream to tooth brush. Brush teeth for 2 minutes. Spit out excess. DO NOT rinse afterwards. Repeat nightly. 1 Tube prn  . dexamethasone (DECADRON) 4 MG tablet Take 2 tablets by mouth once a day on the day after chemotherapy and then take 2 tablets two times a day for 2 days. Take with food. (Patient not taking: Reported on 01/30/2018) 30 tablet 1  . omeprazole (PRILOSEC) 40 MG capsule Take 1 capsule (40 mg total) by mouth daily. 30 capsule 5  . oxyCODONE-acetaminophen (PERCOCET/ROXICET) 5-325 MG tablet   0   No current facility-administered medications for this visit.     PHYSICAL EXAMINATION: ECOG PERFORMANCE STATUS: 1 - Symptomatic but completely ambulatory  Today's Vitals   01/30/18 1537 01/30/18 1542  BP: 125/80   Pulse: 86   Resp: 18   Temp: 97.6 F (36.4 C)   TempSrc: Oral   SpO2: 100%   Weight: 160 lb 14.4 oz (73 kg)   Height: 5\' 10"  (1.778 m)   PainSc:  3    Body mass index is 23.09 kg/m.  Filed Weights   01/30/18 1537  Weight: 160 lb 14.4 oz (73 kg)    GENERAL: alert, no distress, pale-appearing SKIN: skin color, texture, turgor are normal, no rashes or significant lesions EYES: conjunctiva are pink and non-injected, sclera clear OROPHARYNX: Grade 1 mucositis, a few shallow oral ulcers noted  NECK: supple, mild tenderness with palpation in the right neck LYMPH:  Slightly improving right cervical adenopathy, 2x2cm, no other palpable lymphadenopathy LUNGS: clear to auscultation and percussion with normal breathing effort HEART: regular rate & rhythm and no murmurs and no lower extremity edema ABDOMEN: soft, non-tender, non-distended, normal bowel sounds Musculoskeletal: no cyanosis of digits and no clubbing  PSYCH: alert & oriented x 3, slightly mumbled speech due to thick  saliva  NEURO: no focal motor/sensory deficits  LABORATORY DATA:  I have reviewed the data as listed    Component Value Date/Time   NA 135 01/30/2018 1508   K 3.7 01/30/2018 1508   CL 98 01/30/2018 1508   CO2 29 01/30/2018 1508   GLUCOSE 171 (H) 01/30/2018 1508   BUN 20 01/30/2018 1508   CREATININE 1.28 (H) 01/30/2018 1508   CALCIUM 8.9 01/30/2018 1508   PROT 7.3 12/12/2017 1426   ALBUMIN 4.2 12/12/2017 1426   AST 13 (L) 12/12/2017 1426   ALT 20 12/12/2017 1426   ALKPHOS 75 12/12/2017 1426   BILITOT 0.5 12/12/2017 1426  GFRNONAA 60 (L) 01/30/2018 1508   GFRAA >60 01/30/2018 1508    No results found for: SPEP, UPEP  Lab Results  Component Value Date   WBC 3.4 (L) 01/30/2018   NEUTROABS 2.9 01/30/2018   HGB 12.6 (L) 01/30/2018   HCT 37.4 (L) 01/30/2018   MCV 83.1 01/30/2018   PLT 173 01/30/2018      Chemistry      Component Value Date/Time   NA 135 01/30/2018 1508   K 3.7 01/30/2018 1508   CL 98 01/30/2018 1508   CO2 29 01/30/2018 1508   BUN 20 01/30/2018 1508   CREATININE 1.28 (H) 01/30/2018 1508      Component Value Date/Time   CALCIUM 8.9 01/30/2018 1508   ALKPHOS 75 12/12/2017 1426   AST 13 (L) 12/12/2017 1426   ALT 20 12/12/2017 1426   BILITOT 0.5 12/12/2017 1426

## 2018-01-31 ENCOUNTER — Ambulatory Visit
Admission: RE | Admit: 2018-01-31 | Discharge: 2018-01-31 | Disposition: A | Discharge status: Home / Self Care | Payer: BLUE CROSS/BLUE SHIELD | Source: Ambulatory Visit | Attending: Radiation Oncology | Admitting: Radiation Oncology

## 2018-01-31 DIAGNOSIS — R1310 Dysphagia, unspecified: Secondary | ICD-10-CM | POA: Diagnosis not present

## 2018-02-01 ENCOUNTER — Encounter: Payer: Self-pay | Admitting: Hematology

## 2018-02-01 ENCOUNTER — Ambulatory Visit
Admission: RE | Admit: 2018-02-01 | Discharge: 2018-02-01 | Disposition: A | Discharge status: Home / Self Care | Payer: BLUE CROSS/BLUE SHIELD | Source: Ambulatory Visit | Attending: Radiation Oncology | Admitting: Radiation Oncology

## 2018-02-01 ENCOUNTER — Telehealth: Payer: Self-pay | Admitting: *Deleted

## 2018-02-01 ENCOUNTER — Other Ambulatory Visit: Payer: Self-pay | Admitting: Hematology

## 2018-02-01 DIAGNOSIS — C090 Malignant neoplasm of tonsillar fossa: Secondary | ICD-10-CM

## 2018-02-01 DIAGNOSIS — R1310 Dysphagia, unspecified: Secondary | ICD-10-CM | POA: Diagnosis not present

## 2018-02-01 DIAGNOSIS — K1231 Oral mucositis (ulcerative) due to antineoplastic therapy: Secondary | ICD-10-CM

## 2018-02-01 MED ORDER — FENTANYL 25 MCG/HR TD PT72: 25.0000 ug | MEDICATED_PATCH | TRANSDERMAL | 0 refills | Status: AC

## 2018-02-01 MED ORDER — SILVER SULFADIAZINE 1 % EX CREA
TOPICAL_CREAM | Freq: Two times a day (BID) | CUTANEOUS | Status: DC
  Administered 2018-02-01: 17:00:00 via TOPICAL

## 2018-02-01 NOTE — Telephone Encounter (Signed)
Oncology Nurse Navigator Documentation  Rec'd call from patient's wife.  She:  Reported he tried taking liquid morphine sublingually and it immediately caused emesis.  I suggested he dispense via PEG, mixing with small amount of water and following with a 60 cc flush.  Asked about applying aloe vera (ie contents from actual leaf) to neck to help with increasing skin pain.    Reported blisters in some areas of neck appear to be ready to break, she would like to have silvadene available for the weekend. Confirmed with Dr. Isidore Moos application of aloe vera OK, called wife to let her know and to have Nicole Kindred arrive to Nursing after this afternoon's XRT for silvadene.  Gayleen Orem, RN, BSN Head & Neck Oncology Nurse [facility] at [facility] [phone]

## 2018-02-01 NOTE — Telephone Encounter (Signed)
Oncology Nurse Navigator Documentation  Rec'd call from pt's son, answered his questions re the remaining weeks of his dad's tmt, recovery and prognosis.  Note: Permission to speak with pt's children was given my Mr. Lizer prior to the start of tmt.  Gayleen Orem, RN, BSN Head & Neck Oncology Nurse [facility] at [place] [phone]

## 2018-02-04 ENCOUNTER — Ambulatory Visit: Payer: BLUE CROSS/BLUE SHIELD | Attending: Radiation Oncology

## 2018-02-04 ENCOUNTER — Encounter: Payer: Self-pay | Admitting: *Deleted

## 2018-02-04 ENCOUNTER — Inpatient Hospital Stay: Payer: BLUE CROSS/BLUE SHIELD

## 2018-02-04 ENCOUNTER — Ambulatory Visit
Admission: RE | Admit: 2018-02-04 | Discharge: 2018-02-04 | Disposition: A | Discharge status: Home / Self Care | Payer: BLUE CROSS/BLUE SHIELD | Source: Ambulatory Visit | Attending: Radiation Oncology | Admitting: Radiation Oncology

## 2018-02-04 DIAGNOSIS — R1310 Dysphagia, unspecified: Secondary | ICD-10-CM | POA: Diagnosis not present

## 2018-02-04 NOTE — Therapy (Signed)
[facility] [address] [place], [place], [address] Phone: [phone]   Fax:  [phone]  Speech Language Pathology Treatment  Patient Details  Name: Thomas Mccall MRN: [id number] Date of Birth: 1960-08-22 Referring Provider (SLP): Eppie Gibson, MD   Encounter Date: 02/04/2018  End of Session - 02/04/18 1554    Visit Number  3    Number of Visits  7    Date for SLP Re-Evaluation  06/11/18    SLP Start Time  25    SLP Stop Time   1540    SLP Time Calculation (min)  37 min    Activity Tolerance  Patient tolerated treatment well       Past Medical History:  Diagnosis Date  . Headache   . Mass    right tonsil  . Wears glasses     Past Surgical History:  Procedure Laterality Date  . DIRECT LARYNGOSCOPY Right 11/12/2017   Procedure: DIRECT LARYNGOSCOPY;  Surgeon: Melissa Montane, MD;  Location: [facility];  Service: ENT;  Laterality: Right;  Direct laryngoscopy and biopy of right tonsil mass  . IR GASTROSTOMY TUBE MOD SED  12/26/2017  . IR IMAGING GUIDED PORT INSERTION  12/26/2017  . TONSILLECTOMY AND ADENOIDECTOMY  1970  . WISDOM TOOTH EXTRACTION      There were no vitals filed for this visit.  Subjective Assessment - 02/04/18 1513    Subjective  Pt again walks gingerly into ST room.    Patient is accompained by:  Family member   wife   Pain Score  3     Pain Location  Neck    Pain Orientation  Right   base of neck   Pain Descriptors / Indicators  Burning    Pain Type  Acute pain            ADULT SLP TREATMENT - 02/04/18 1515      General Information   Behavior/Cognition  Alert;Cooperative      Treatment Provided   Treatment provided  Dysphagia      Dysphagia Treatment   Temperature Spikes Noted  No    Respiratory Status  Room air    Oral Cavity - Dentition  Adequate natural dentition    Treatment Methods  Skilled observation;Compensation strategy training;Patient/caregiver  education;Therapeutic exercise    Patient observed directly with PO's  Yes    Type of PO's observed  Thin liquids    Oral Phase Signs & Symptoms  --   none noted   Pharyngeal Phase Signs & Symptoms  --   none noted   Other treatment/comments  Pt using sign/gesture as much as possible today. Pt told SLP rationale for HEP. Pt completed HEP with occasional min-mod cues. Pt admits noncompliance with HEP, and is tolerating approx 4 cans tube feed daily. Pt denies overt s/s aspiration PNA, and none seen today.  Given that pt appears safe with water POs, SLP told pt to push these and reiterated he is reducing his risk of muslce fibrosis and muscle disuse atrophy by ingesting as much liquids as he can tolerate daily. SLP encouraged purees if pt can tolerate, alternate with liquids.  SLP reminded pt to target non-swallowing HEP exercises if he finds swallowing HEP exercises too painful/difficult to tolerate full number of reps. Encouraged pt to, as he feels better, to incr number reps and the scope of the HEP.       Assessment / Recommendations / Plan   Plan  Continue with  current plan of care      Dysphagia Recommendations   Diet recommendations  --   as tolerated   Liquids provided via  Cup   or bottle   Medication Administration  --   as tolerated   Compensations  Follow solids with liquid   if pt can tolerate solids as well as liquids      SLP Education - 02/04/18 1553    Education Details  incr scope and reps of HEP as he can tolerate, push H2O now and purees if he can tolerate - alternate 1:1 ratio, target nonswalloing HEP exercises if necessary until pt can incr scope of HEP    Person(s) Educated  Patient;Spouse    Methods  Explanation;Demonstration;Verbal cues    Comprehension  Verbalized understanding;Returned demonstration;Verbal cues required;Need further instruction       SLP Short Term Goals - 02/04/18 1556      SLP SHORT TERM GOAL #1   Title  pt will complete HEP with rare min  A over two sessions    Baseline  01-07-18    Time  1    Period  --   visits   Status  On-going      SLP SHORT TERM GOAL #2   Title  pt will tell SLP why he is completing HEP     Status  Achieved      SLP SHORT TERM GOAL #3   Title  pt will tell SLP how a food journal can foster a quicker return to a more-normalized diet    Time  2    Period  --   visits   Status  On-going       SLP Long Term Goals - 02/04/18 [place] #1   Title  pt will tell SLP 3 overt s/s of aspiration PNA with modified independence    Time  3    Period  --   visits   Status  On-going      SLP LONG TERM GOAL #2   Title  pt will complete HEP with modified independence over two sessions    Time  3    Period  --   visits   Status  On-going      SLP LONG TERM GOAL #3   Title  pt will tell SLP how to access information about life post-head/neck radiation with modified independence (classes, online info, etc)    Time  5    Period  --   visits   Status  On-going       Plan - 02/04/18 1555    Clinical Impression Statement  Pt complains of neck pain today, is completing HEP at suboptimal level. However, pt with oropharyngeal swallowing continuing as essentially WNL/WFL with sips H2O. Encouraged pt to incr H2O intake and possibly mix with some puree with alternating solid/liquid. Probability of swallowing difficulty persists with the initiation of chemo and radiation therapy. Pt will need to cont to be followed by SLP for regular assessment of accurate HEP completion as well as for safety with POs both during and following treatment/s.    Speech Therapy Frequency  --   approx once every 4 weeks   Duration  --   7 total visits   Treatment/Interventions  Aspiration precaution training;Pharyngeal strengthening exercises;Diet toleration management by SLP;Trials of upgraded texture/liquids;Internal/external aids;Patient/family education;Compensatory strategies;SLP instruction and  feedback;Cueing hierarchy;Environmental controls    Potential to Achieve Goals  Good  Potential Considerations  --   anxiety(?)   SLP Home Exercise Plan  provided today    Consulted and Agree with Plan of Care  Patient       Patient will benefit from skilled therapeutic intervention in order to improve the following deficits and impairments:   Dysphagia, unspecified type    Problem List Patient Active Problem List   Diagnosis Date Noted  . Leukopenia due to antineoplastic chemotherapy ([place]) 01/30/2018  . Chemotherapy-induced thrombocytopenia 01/24/2018  . Chemotherapy induced neutropenia ([place]) 01/17/2018  . AKI (acute kidney injury) ([place]) 01/17/2018  . Chemotherapy-induced nausea 01/17/2018  . Dysgeusia 01/17/2018  . Malnutrition ([place]) 01/17/2018  . Mucositis due to chemotherapy 01/17/2018  . Port-A-Cath in place 01/09/2018  . Malignant neoplasm of tonsillar fossa ([facility]) 11/30/2017    Deeya Richeson ,[place], CCC-SLP  02/04/2018, 3:57 PM  [facility] [address] [place], [place], [address] Phone: [phone]   Fax:  [phone]   Name: Thomas Mccall MRN: [id number] Date of Birth: [birth year]

## 2018-02-04 NOTE — Progress Notes (Signed)
[facility] OFFICE PROGRESS NOTE  Patient Care Team: Martinique, Betty G, MD as PCP - General (Family Medicine) Eppie Gibson, MD as Attending Physician (Radiation Oncology) Tish Men, MD as Consulting Physician (Hematology) Leota Sauers, RN as Oncology Nurse Navigator (Oncology) Karie Mainland, RD as Dietitian (Nutrition) Valentino Saxon [place], CCC-SLP as Speech Language Pathologist (Speech Pathology) Wynelle Beckmann, Melodie Bouillon, PT as Physical Therapist (Physical Therapy)  HEME/ONC OVERVIEW: 1. 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, p16+ -For detailed oncologic hx, see below -Concurrent chemoRT started on 12/28/2017; received 2 doses of cisplatin so far  -Dose reduced to 80mg /m2 starting with 2nd dose due to Grade 3 neutropenia  -Dose #3 scheduled on 02/15/2018   Chemotherapy-induced tinnitus -Patient reports moderate, intermittent bilateral tinnitus, grade 2 -We will monitor closely; if tinnitus worsens or becomes more persistent, then we will consider omitting Dose #3 of cisplatin   Chemotherapy-induced leukopenia -Stable leukopenia today; no neutropenia  -Patient denies any symptom of infection -We will continue monitor it for now  Chemotherapy-induced thrombocytopenia -Plts 112k today -Patient denies any abnormal bleeding or bruising -We will continue monitor for now  Chemotherapy-induced nausea -Patient reports moderate nausea, controlled with PRN Zofran, Compazine, dexamethasone and Ativan -Continue anti-emetics as prescribed  AKI -Cr 0.96 today, stable-Secondary to dehydration and cisplatin-related nephrotoxicity -He is  receiving home IV fluid (M-F, 1-2 L/day) -Weekly renal function monitoring   Protein malnutrition -The patient is currently taking approximately 4 cans of Osmolite per day -His weight is overall stable  -Home IV fluid initiated due to dehydration -I encouraged the patient to adhere to the enteral nutrition as prescribed by the dietician   Treatment-related mucositis -Patient reports moderate pain due to mouth ulcers and mucositis -I encouraged patient to use liquid morphine as needed   GERD -He is currently omeprazole 40mg  daily for acid reflux   All questions were answered. The patient knows to call the clinic with any problems, questions or concerns. No barriers to learning was detected.  A total of more than 25 minutes were spent face-to-face with the patient during this encounter and over half of that time was spent on counseling and coordination of care as outlined above.   Tish Men, MD 02/06/2018 5:22 PM  CHIEF COMPLAINT: "My mouth hurts "  INTERVAL HISTORY: Thomas Mccall returns to clinic for toxicity check after second cycle of cisplatin.  He reports persistent pain in the neck area from radiation and with swallowing, but is relatively controlled with liquid morphine.  He also reports moderate, intermittent bilateral tinnitus, which affects his hearing during episodes of tinnitus, but otherwise his hearing is relatively normal.  He is able to get most tube feeds down without difficulty.  His nausea is relatively well controlled.  He denies any fever, chill, chest pain, dyspnea, abdominal pain, or neuropathy in the hands or feet.  He is getting 1 to 2 L of normal saline per day at home via home health.   SUMMARY OF ONCOLOGIC HISTORY:   Malignant neoplasm of tonsillar fossa ([place])   10/30/2017 Miscellaneous    Presented to Dr. Redmond Baseman with 3 months of  R neck mass    11/07/2017 Imaging    CT neck w/ contrast:  1. Right tonsillar mass measures up to 2.6 cm, highly concerning for a  squamous cell carcinoma. 2. Enlarged homogeneous right level 2 lymph node measures up to 3.9 cm, consistent with metastatic disease. 3. Adjacent right level 3 lymph node measures 1.1 cm. 4. Lucency at C4 on the left is likely degenerative. Metastatic disease is not excluded. Please correlate with findings on PET scan if used for further evaluation.    11/12/2017 Procedure    R tonsillar biopsy by Dr. Janace Hoard    11/12/2017 Pathology Results    Accession: [id number] Invasive squamous cell carcinoma; p16 diffusely positive    11/28/2017 Imaging    PET scan: 1. Right tonsillar fossa mass is intensely hypermetabolic compatible with primary neoplasm. 2. Solitary enlarged and hypermetabolic right level 2 cervical lymph node compatible with metastatic adenopathy. 3. No evidence for FDG avid hypermetabolic distant metastasis.    11/30/2017 Cancer Staging    Staging form: Pharynx - HPV-Mediated Oropharynx, AJCC 8th Edition - Clinical stage from 11/30/2017: Stage I (cT2, cN1, cM0, p16+) - Signed by Eppie Gibson, MD on 11/30/2017    12/07/2017 Miscellaneous    ENT evaluation to [facility] recommended against surgical resection due to the tumor extending into the right inferior nasopharynx and posterior soft palate, which would require resection of almost 1/2 of the soft palate and the right nasopharyngeal wall extending into right posterior/lateral posterior wall.     Procedure    12/26/2017: port and PEG tube placement     12/12/2017 -  Chemotherapy    The patient had palonosetron (ALOXI) injection 0.25 mg, 0.25 mg, Intravenous,  Once, 2 of 3 cycles Administration: 0.25 mg (12/28/2017), 0.25 mg (01/25/2018) CISplatin (PLATINOL) 196 mg in sodium chloride 0.9 % 500 mL chemo infusion, 100 mg/m2 = 196 mg, Intravenous,  Once, 2 of 3 cycles Dose modification: 80 mg/m2 (original dose 100 mg/m2, Cycle 2, Reason: Dose not tolerated, Comment: Neutropenia) Administration: 196 mg (12/28/2017), 157 mg  (01/25/2018) fosaprepitant (EMEND) 150 mg, dexamethasone (DECADRON) 12 mg in sodium chloride 0.9 % 145 mL IVPB, , Intravenous,  Once, 2 of 3 cycles Administration:  (12/28/2017),  (01/25/2018)  for chemotherapy treatment.      REVIEW OF SYSTEMS:   Constitutional: ( - ) fevers, ( - )  chills , ( - ) night sweats Eyes: ( - ) blurriness of vision, ( - ) double vision, ( - ) watery eyes Ears, nose, mouth, throat, and face: ( + ) mucositis, ( + ) sore throat Respiratory: ( - ) cough, ( - ) dyspnea, ( - ) wheezes Cardiovascular: ( - ) palpitation, ( - ) chest discomfort, ( - ) lower extremity swelling Gastrointestinal:  ( + ) nausea, ( + ) heartburn, ( - ) change in bowel habits Skin: ( - ) abnormal skin rashes Lymphatics: ( - ) new lymphadenopathy, ( - ) easy bruising Neurological: ( - ) numbness, ( - ) tingling, ( - ) new weaknesses, ( + ) tinnitus  Behavioral/Psych: ( - ) mood change, ( - ) new changes  All other systems were reviewed with the patient and are negative.  I have reviewed the past medical history, past surgical history, social history and family history with the patient and they are unchanged from previous note.  ALLERGIES:  has No Known Allergies.  MEDICATIONS:  Current Outpatient Medications  Medication Sig Dispense Refill  . Baclofen 5 MG TABS  Take 5 mg by mouth every 8 (eight) hours as needed.  0  . fentaNYL (DURAGESIC - DOSED MCG/HR) 25 MCG/HR patch Place 1 patch (25 mcg total) onto the skin every 3 (three) days. 10 patch 0  . lidocaine (XYLOCAINE) 2 % solution Patient: Mix 1part 2% viscous lidocaine, 1part H20. Swish & swallow 60mL of diluted mixture, 67min before meals and at bedtime, up to QID 100 mL 5  . lidocaine-prilocaine (EMLA) cream Apply to affected area once 30 g 3  . LORazepam (ATIVAN) 0.5 MG tablet Take 1 tablet (0.5 mg total) by mouth every 6 (six) hours as needed (Nausea or vomiting). 30 tablet 0  . Nutritional Supplements (FEEDING SUPPLEMENT, OSMOLITE 1.5  CAL,) LIQD Begin 1.5 bottles Osmolite 1.5 via PEG QID with 60 mL free water before and after bolus feedings. Mix 30 mL Promod with 60 mL water BID and put into PEG. Flush with 30 mL water. Do not mix Promod with Osmolite 1.5 6 Bottle 0  . omeprazole (PRILOSEC) 40 MG capsule Take 1 capsule (40 mg total) by mouth daily. 30 capsule 5  . ondansetron (ZOFRAN) 8 MG tablet Take 1 tablet (8 mg total) by mouth every 8 (eight) hours as needed. Start on the third day after chemotherapy. 60 tablet 1  . oxyCODONE-acetaminophen (PERCOCET/ROXICET) 5-325 MG tablet   0  . dexamethasone (DECADRON) 4 MG tablet Take 2 tablets by mouth once a day on the day after chemotherapy and then take 2 tablets two times a day for 2 days. Take with food. (Patient not taking: Reported on 01/30/2018) 30 tablet 1  . prochlorperazine (COMPAZINE) 10 MG tablet Take 1 tablet (10 mg total) by mouth every 6 (six) hours as needed (Nausea or vomiting). (Patient not taking: Reported on 02/06/2018) 30 tablet 1  . sodium fluoride (PREVIDENT 5000 PLUS) 1.1 % CREA dental cream Apply cream to tooth brush. Brush teeth for 2 minutes. Spit out excess. DO NOT rinse afterwards. Repeat nightly. (Patient not taking: Reported on 02/06/2018) 1 Tube prn   No current facility-administered medications for this visit.     PHYSICAL EXAMINATION: ECOG PERFORMANCE STATUS: 1 - Symptomatic but completely ambulatory  Today's Vitals   02/06/18 1647 02/06/18 1650  BP: 131/80   Pulse: 81   Resp: 18   Temp: 98.3 F (36.8 C)   TempSrc: Oral   SpO2: 100%   PainSc:  3    There is no height or weight on file to calculate BMI.  There were no vitals filed for this visit.  GENERAL: alert, no distress, pale-appearing SKIN: dark erythematous, raw-appearing skin over the neck and base of the cheek from radiation  EYES: conjunctiva are pink and non-injected, sclera clear OROPHARYNX: Grade 1 mucositis, a few shallow oral ulcers noted  NECK: supple, mild tenderness with  palpation in the right neck LYMPH:  Slightly improving right cervical adenopathy, 2x2cm, no other palpable lymphadenopathy LUNGS: clear to auscultation and percussion with normal breathing effort HEART: regular rate & rhythm and no murmurs and no lower extremity edema ABDOMEN: soft, non-tender, non-distended, normal bowel sounds Musculoskeletal: no cyanosis of digits and no clubbing  PSYCH: alert, speaking very little due to mucositis  NEURO: no focal motor/sensory deficits  LABORATORY DATA:  I have reviewed the data as listed    Component Value Date/Time   NA 141 02/06/2018 1554   K 3.9 02/06/2018 1554   CL 106 02/06/2018 1554   CO2 27 02/06/2018 1554   GLUCOSE 130 (H) 02/06/2018 1554  BUN 16 02/06/2018 1554   CREATININE 0.96 02/06/2018 1554   CALCIUM 8.6 (L) 02/06/2018 1554   PROT 7.3 12/12/2017 1426   ALBUMIN 4.2 12/12/2017 1426   AST 13 (L) 12/12/2017 1426   ALT 20 12/12/2017 1426   ALKPHOS 75 12/12/2017 1426   BILITOT 0.5 12/12/2017 1426   GFRNONAA >60 02/06/2018 1554   GFRAA >60 02/06/2018 1554    No results found for: SPEP, UPEP  Lab Results  Component Value Date   WBC 3.8 (L) 02/06/2018   NEUTROABS 3.3 02/06/2018   HGB 10.8 (L) 02/06/2018   HCT 32.3 (L) 02/06/2018   MCV 85.2 02/06/2018   PLT 112 (L) 02/06/2018      Chemistry      Component Value Date/Time   NA 141 02/06/2018 1554   K 3.9 02/06/2018 1554   CL 106 02/06/2018 1554   CO2 27 02/06/2018 1554   BUN 16 02/06/2018 1554   CREATININE 0.96 02/06/2018 1554      Component Value Date/Time   CALCIUM 8.6 (L) 02/06/2018 1554   ALKPHOS 75 12/12/2017 1426   AST 13 (L) 12/12/2017 1426   ALT 20 12/12/2017 1426   BILITOT 0.5 12/12/2017 1426

## 2018-02-05 ENCOUNTER — Inpatient Hospital Stay: Payer: BLUE CROSS/BLUE SHIELD

## 2018-02-05 ENCOUNTER — Inpatient Hospital Stay: Payer: BLUE CROSS/BLUE SHIELD | Admitting: Nutrition

## 2018-02-05 ENCOUNTER — Ambulatory Visit
Admission: RE | Admit: 2018-02-05 | Discharge: 2018-02-05 | Disposition: A | Discharge status: Home / Self Care | Payer: BLUE CROSS/BLUE SHIELD | Source: Ambulatory Visit | Attending: Radiation Oncology | Admitting: Radiation Oncology

## 2018-02-05 DIAGNOSIS — R1310 Dysphagia, unspecified: Secondary | ICD-10-CM | POA: Diagnosis not present

## 2018-02-05 NOTE — Progress Notes (Signed)
PA for fantanyl patches has been submitted.

## 2018-02-05 NOTE — Progress Notes (Signed)
Nutrition follow-up completed with patient and wife prior to radiation therapy. Patient reports he is constipated.  He is taking Senokot and MiraLAX with no results yet.  Reports he will use a suppository or an enema tonight per MD instruction. Weight improved and documented as 163.2 pounds November 19 increased from 160.9 pounds November 13. Patient is receiving 2 L of IV fluids daily Monday through Friday. He continues to only give 4 bottles Osmolite 1.5 via PEG daily instead of 6. No complaints of nausea and vomiting today.  Nutrition diagnosis: Inadequate oral intake continues.  Estimated nutrition needs: 2250-2450 cal, 95-115 g protein, 2.4 L fluid.  Intervention: Educated patient on the importance of increasing Osmolite 1.5-6 bottles daily. Patient is agreeable to trying one half bottles 4 times daily. Recommended patient try 30 mL of ProMod mixed with 60 mL of water after feeding.  Monitoring, evaluation, goals: Patient will tolerate increased tube feeding to goal rate to provide 100% estimated nutrition needs to minimize weight loss and promote healing throughout treatment.  Next visit: Tuesday, November 26 before radiation.  **Disclaimer: This note was dictated with voice recognition software. Similar sounding words can inadvertently be transcribed and this note may contain transcription errors which may not have been corrected upon publication of note.**

## 2018-02-06 ENCOUNTER — Encounter: Payer: Self-pay | Admitting: Hematology

## 2018-02-06 ENCOUNTER — Inpatient Hospital Stay: Payer: BLUE CROSS/BLUE SHIELD

## 2018-02-06 ENCOUNTER — Inpatient Hospital Stay (HOSPITAL_BASED_OUTPATIENT_CLINIC_OR_DEPARTMENT_OTHER): Payer: BLUE CROSS/BLUE SHIELD | Admitting: Hematology

## 2018-02-06 ENCOUNTER — Ambulatory Visit
Admission: RE | Admit: 2018-02-06 | Discharge: 2018-02-06 | Disposition: A | Discharge status: Home / Self Care | Payer: BLUE CROSS/BLUE SHIELD | Source: Ambulatory Visit | Attending: Radiation Oncology | Admitting: Radiation Oncology

## 2018-02-06 VITALS — BP 131/80 | HR 81 | Temp 98.3°F | Resp 18

## 2018-02-06 DIAGNOSIS — E860 Dehydration: Secondary | ICD-10-CM

## 2018-02-06 DIAGNOSIS — K1231 Oral mucositis (ulcerative) due to antineoplastic therapy: Secondary | ICD-10-CM

## 2018-02-06 DIAGNOSIS — T451X5A Adverse effect of antineoplastic and immunosuppressive drugs, initial encounter: Secondary | ICD-10-CM

## 2018-02-06 DIAGNOSIS — C090 Malignant neoplasm of tonsillar fossa: Secondary | ICD-10-CM

## 2018-02-06 DIAGNOSIS — E46 Unspecified protein-calorie malnutrition: Secondary | ICD-10-CM

## 2018-02-06 DIAGNOSIS — D701 Agranulocytosis secondary to cancer chemotherapy: Secondary | ICD-10-CM | POA: Diagnosis not present

## 2018-02-06 DIAGNOSIS — C770 Secondary and unspecified malignant neoplasm of lymph nodes of head, face and neck: Secondary | ICD-10-CM

## 2018-02-06 DIAGNOSIS — H9319 Tinnitus, unspecified ear: Secondary | ICD-10-CM | POA: Insufficient documentation

## 2018-02-06 DIAGNOSIS — R110 Nausea: Secondary | ICD-10-CM

## 2018-02-06 DIAGNOSIS — Z79899 Other long term (current) drug therapy: Secondary | ICD-10-CM

## 2018-02-06 DIAGNOSIS — Z931 Gastrostomy status: Secondary | ICD-10-CM | POA: Diagnosis not present

## 2018-02-06 DIAGNOSIS — N179 Acute kidney failure, unspecified: Secondary | ICD-10-CM

## 2018-02-06 DIAGNOSIS — T451X5S Adverse effect of antineoplastic and immunosuppressive drugs, sequela: Secondary | ICD-10-CM

## 2018-02-06 DIAGNOSIS — H9313 Tinnitus, bilateral: Secondary | ICD-10-CM

## 2018-02-06 DIAGNOSIS — R1310 Dysphagia, unspecified: Secondary | ICD-10-CM | POA: Diagnosis not present

## 2018-02-06 DIAGNOSIS — R634 Abnormal weight loss: Secondary | ICD-10-CM

## 2018-02-06 DIAGNOSIS — M542 Cervicalgia: Secondary | ICD-10-CM

## 2018-02-06 DIAGNOSIS — D6959 Other secondary thrombocytopenia: Secondary | ICD-10-CM

## 2018-02-06 LAB — CBC WITH DIFFERENTIAL (CANCER CENTER ONLY)
Abs Immature Granulocytes: 0.01 10*3/uL (ref 0.00–0.07)
Basophils Absolute: 0 10*3/uL (ref 0.0–0.1)
Basophils Relative: 0 %
Eosinophils Absolute: 0 10*3/uL (ref 0.0–0.5)
Eosinophils Relative: 0 %
HCT: 32.3 % — ABNORMAL LOW (ref 39.0–52.0)
Hemoglobin: 10.8 g/dL — ABNORMAL LOW (ref 13.0–17.0)
Immature Granulocytes: 0 %
Lymphocytes Relative: 3 %
Lymphs Abs: 0.1 10*3/uL — ABNORMAL LOW (ref 0.7–4.0)
MCH: 28.5 pg (ref 26.0–34.0)
MCHC: 33.4 g/dL (ref 30.0–36.0)
MCV: 85.2 fL (ref 80.0–100.0)
Monocytes Absolute: 0.4 10*3/uL (ref 0.1–1.0)
Monocytes Relative: 10 %
Neutro Abs: 3.3 10*3/uL (ref 1.7–7.7)
Neutrophils Relative %: 87 %
Platelet Count: 112 10*3/uL — ABNORMAL LOW (ref 150–400)
RBC: 3.79 MIL/uL — ABNORMAL LOW (ref 4.22–5.81)
RDW: 12.4 % (ref 11.5–15.5)
WBC Count: 3.8 10*3/uL — ABNORMAL LOW (ref 4.0–10.5)
nRBC: 0 % (ref 0.0–0.2)

## 2018-02-06 LAB — BASIC METABOLIC PANEL - CANCER CENTER ONLY
Anion gap: 8 (ref 5–15)
BUN: 16 mg/dL (ref 6–20)
CO2: 27 mmol/L (ref 22–32)
Calcium: 8.6 mg/dL — ABNORMAL LOW (ref 8.9–10.3)
Chloride: 106 mmol/L (ref 98–111)
Creatinine: 0.96 mg/dL (ref 0.61–1.24)
GFR, Est AFR Am: >60 mL/min (ref >60)
GFR, Estimated: >60 mL/min (ref >60)
Glucose, Bld: 130 mg/dL — ABNORMAL HIGH (ref 70–99)
Potassium: 3.9 mmol/L (ref 3.5–5.1)
Sodium: 141 mmol/L (ref 135–145)

## 2018-02-06 LAB — MAGNESIUM: Magnesium: 2 mg/dL (ref 1.7–2.4)

## 2018-02-07 ENCOUNTER — Ambulatory Visit
Admission: RE | Admit: 2018-02-07 | Discharge: 2018-02-07 | Disposition: A | Discharge status: Home / Self Care | Payer: BLUE CROSS/BLUE SHIELD | Source: Ambulatory Visit | Attending: Radiation Oncology | Admitting: Radiation Oncology

## 2018-02-07 ENCOUNTER — Ambulatory Visit: Payer: Self-pay

## 2018-02-07 ENCOUNTER — Telehealth: Payer: Self-pay | Admitting: Hematology

## 2018-02-07 DIAGNOSIS — R1310 Dysphagia, unspecified: Secondary | ICD-10-CM | POA: Diagnosis not present

## 2018-02-07 NOTE — Telephone Encounter (Signed)
No los per 11/20 (no change in appointments)

## 2018-02-08 ENCOUNTER — Telehealth: Payer: Self-pay | Admitting: *Deleted

## 2018-02-08 ENCOUNTER — Ambulatory Visit
Admission: RE | Admit: 2018-02-08 | Discharge: 2018-02-08 | Disposition: A | Discharge status: Home / Self Care | Payer: BLUE CROSS/BLUE SHIELD | Source: Ambulatory Visit | Attending: Radiation Oncology | Admitting: Radiation Oncology

## 2018-02-08 ENCOUNTER — Other Ambulatory Visit: Payer: Self-pay | Admitting: Hematology

## 2018-02-08 DIAGNOSIS — C090 Malignant neoplasm of tonsillar fossa: Secondary | ICD-10-CM | POA: Diagnosis not present

## 2018-02-08 DIAGNOSIS — R1310 Dysphagia, unspecified: Secondary | ICD-10-CM | POA: Diagnosis not present

## 2018-02-08 NOTE — Telephone Encounter (Signed)
Hi Thu,  I got a message from [facility] on Mr. Stith, requesting to order BioPatch. I tried to search the orders, but can't find any. Can you help?   Thanks!  [place]

## 2018-02-08 NOTE — Progress Notes (Signed)
Oncology Nurse Navigator Documentation  To provide support, encouragement and care continuity, met with Mr. Snowdon after XRT/during weekly PUT with Dr. Isidore Moos.  He has completed 28 of 35 fxt.  [name]. He reported:  Last BM 5 days ago.  He was guided to take OTC Miralax BID including dulcolax with morning dose.  If no BM after 3 doses, fleets enema recommended.  Conducting most of HEP BID.  Drinking water/taking pills orally.  Sees Dr. Maylon Peppers Wednesday this week.  Visit to include discussion of 3rd cycle Cisplatin 11/29 after 11/26 RT completion. He denied specific needs/concerns.  I encouraged them to call me as needed.  Gayleen Orem, RN, BSN Head & Neck Oncology Nurse [place] [facility] [phone]

## 2018-02-08 NOTE — Telephone Encounter (Signed)
"  Verdell Face RN, [place] [phone]).  Thomas Mccall receives IVF in the home weekly.   Every Monday I'll access and every Friday to de-access.  Need order for Bio-patch.  Will not allow access without Bio-patch.  Order needed for Bio- patch."   Provided order for Bio-patch application weekly with port-a-cath access for in-home IVF's.    Thomas Mccall denies further needs or questions at this time.

## 2018-02-11 ENCOUNTER — Other Ambulatory Visit: Payer: Self-pay | Admitting: Radiation Oncology

## 2018-02-11 ENCOUNTER — Ambulatory Visit
Admission: RE | Admit: 2018-02-11 | Discharge: 2018-02-11 | Disposition: A | Discharge status: Home / Self Care | Payer: BLUE CROSS/BLUE SHIELD | Source: Ambulatory Visit | Attending: Radiation Oncology | Admitting: Radiation Oncology

## 2018-02-11 DIAGNOSIS — R1310 Dysphagia, unspecified: Secondary | ICD-10-CM | POA: Diagnosis not present

## 2018-02-11 DIAGNOSIS — C090 Malignant neoplasm of tonsillar fossa: Secondary | ICD-10-CM

## 2018-02-11 MED ORDER — SCOPOLAMINE 1 MG/3DAYS TD PT72: 1.0000 | MEDICATED_PATCH | TRANSDERMAL | 3 refills | Status: DC

## 2018-02-11 MED FILL — FENTANYL 25 MCG/HR PT72: 25 | 30 days supply | Qty: 10 | Fill #0

## 2018-02-11 MED FILL — SCOPOLAMINE 1 MG/3DAYS PT72: 1 | 12 days supply | Qty: 4 | Fill #0

## 2018-02-12 ENCOUNTER — Ambulatory Visit
Admission: RE | Admit: 2018-02-12 | Discharge: 2018-02-12 | Disposition: A | Discharge status: Home / Self Care | Payer: BLUE CROSS/BLUE SHIELD | Source: Ambulatory Visit | Attending: Radiation Oncology | Admitting: Radiation Oncology

## 2018-02-12 ENCOUNTER — Telehealth: Payer: Self-pay | Admitting: Emergency Medicine

## 2018-02-12 ENCOUNTER — Inpatient Hospital Stay: Payer: BLUE CROSS/BLUE SHIELD | Admitting: Nutrition

## 2018-02-12 DIAGNOSIS — R1310 Dysphagia, unspecified: Secondary | ICD-10-CM | POA: Diagnosis not present

## 2018-02-12 DIAGNOSIS — C090 Malignant neoplasm of tonsillar fossa: Secondary | ICD-10-CM

## 2018-02-12 MED ORDER — SONAFINE EX EMUL
1.0000 "application " | Freq: Once | CUTANEOUS | Status: AC
  Administered 2018-02-12: 1 via TOPICAL

## 2018-02-12 NOTE — Progress Notes (Signed)
Nutrition follow-up completed with patient and wife prior to radiation therapy for right tonsil cancer.  Patient completes radiation and chemotherapy this week. Patient reports constipation has resolved. He is not tolerating the protein supplement anymore. His intake of Osmolite 1.5 varies and he is unable to tell me exactly how much he is tolerating daily. He is drinking very little water. He continues to get IV fluids at home Monday through Friday. Reports vomiting secondary to thickened saliva.  Nutrition diagnosis: Inadequate oral intake continues.  Estimated nutrition needs: 2250-2450 cal, 95-115 g protein, 2.4 L fluid.  Intervention: Again educated patient to try to increase Osmolite 1.5-6 bottles daily. Told him to discontinue ProMod for now.  We will try again in several weeks. Encourage patient to increase oral fluid intake as well as fluid through his feeding tube. Recommended cool mist humidifier to help with thickened secretions. Fax sheets were provided.  Questions were answered.  Teach back method used.  Monitoring, evaluation, goals: Patient will tolerate increased calories and protein to minimize weight loss and promote healing.  Next visit: To be scheduled in approximately 1 month.  **Disclaimer: This note was dictated with voice recognition software. Similar sounding words can inadvertently be transcribed and this note may contain transcription errors which may not have been corrected upon publication of note.**

## 2018-02-12 NOTE — Progress Notes (Signed)
[place] OFFICE PROGRESS NOTE  Patient Care Team: Martinique, Betty G, MD as PCP - General (Family Medicine) Eppie Gibson, MD as Attending Physician (Radiation Oncology) Tish Men, MD as Consulting Physician (Hematology) Leota Sauers, RN as Oncology Nurse Navigator (Oncology) Karie Mainland, RD as Dietitian (Nutrition) Valentino Saxon [place], CCC-SLP as Speech Language Pathologist (Speech Pathology) Wynelle Beckmann, Melodie Bouillon, PT as Physical Therapist (Physical Therapy)  HEME/ONC OVERVIEW: 1. 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, p16+ -For detailed oncologic hx, see below -Concurrent chemoRT started on 12/28/2017; received 2 doses of cisplatin so far  -Dose reduced to 80mg /m2 starting with 2nd dose due to Grade 3 neutropenia  -Patient reports progressive bilateral tinnitus causing at least moderate hearing difficulty since the 2nd dose of chemotherapy -He will finish the last radiation treatment today; given the significant ototoxicity, we will cancel the last cycle of chemotherapy   Chemotherapy-induced tinnitus -As discussed above, the bilateral tinnitus has become progressively worse, now associated with hearing difficulty impacting ADL's -Given the severity of ototoxicity, we will cancel the last cycle of cisplatin   Chemotherapy-induced leukopenia -Stable leukopenia today; no neutropenia  -Patient denies any symptom of infection -We will continue monitor it for now  Chemotherapy-induced thrombocytopenia -Plts 147k today -Patient denies any abnormal bleeding or bruising -We will continue monitor for now  Chemotherapy-induced  nausea -Patient reports moderate nausea, controlled with PRN Zofran, Compazine, dexamethasone and Ativan -Continue anti-emetics as prescribed  Chemotherapy-induced renal dysfunction -Cr normal today, stable -Secondary to dehydration and cisplatin-related nephrotoxicity -He is receiving home IV fluid (M-F, 1-2 L/day) -Weekly renal function monitoring   Protein malnutrition -The patient is currently taking approximately 4 cans of Osmolite per day -He has lost ~15 lbs since the start of treatment despite enteral nutrition and home IV fluids  -Home IV fluid initiated due to dehydration -I encouraged the patient to adhere to the enteral nutrition as prescribed by the dietician   Treatment-related mucositis -Patient reports moderate pain due to mouth ulcers and mucositis -I encouraged patient to use liquid morphine as needed   GERD -He is currently omeprazole 40mg  daily for acid reflux   All questions were answered. The patient knows to call the clinic with any problems, questions or concerns. No barriers to learning was detected.  Return in 1 week for labs and clinic follow-up.  Tish Men, MD 02/13/2018 4:11 PM  CHIEF COMPLAINT: "He is feeling pretty bad"  INTERVAL HISTORY: Thomas Mccall returns to clinic for toxicity check prior to the 3rd and final cycle of cisplatin.  He has significant mucositis, making it difficult to speak, and therefore the history was mostly obtained through his spouse.  Patient reports that he has had progressive bilateral ringing in the ears, causing at least moderate hearing difficulty on the regular basis.  He also reports that mucus secretion has been difficult to clear, causing him to gag sometimes, leading to emesis.  In addition, he reports persistent mucositis, for which is relatively controlled with PRN morphine.  He denies any fever, chill, night sweats, chest pain, dyspnea, abdominal pain, or neuropathy in the hands or feet.  He is getting 1 to 2 L of NS  per day at home via home health.  SUMMARY OF ONCOLOGIC HISTORY:   Malignant neoplasm of tonsillar fossa ([facility])   10/30/2017 Miscellaneous    Presented to Dr. Redmond Baseman with 3 months of R neck mass    11/07/2017 Imaging    CT neck w/ contrast:  1. Right tonsillar mass measures up to 2.6 cm, highly concerning for a squamous cell carcinoma. 2. Enlarged homogeneous right level 2 lymph node measures up to 3.9 cm, consistent with metastatic disease. 3. Adjacent right level 3 lymph node measures 1.1 cm. 4. Lucency at C4 on the left is likely degenerative. Metastatic disease is not excluded. Please correlate with findings on PET scan if used for further evaluation.    11/12/2017 Procedure    R tonsillar biopsy by Dr. Janace Hoard    11/12/2017 Pathology Results    Accession: [id number] Invasive squamous cell carcinoma; p16 diffusely positive    11/28/2017 Imaging    PET scan: 1. Right tonsillar fossa mass is intensely hypermetabolic compatible with primary neoplasm. 2. Solitary enlarged and hypermetabolic right level 2 cervical lymph node compatible with metastatic adenopathy. 3. No evidence for FDG avid hypermetabolic distant metastasis.    11/30/2017 Cancer Staging    Staging form: Pharynx - HPV-Mediated Oropharynx, AJCC 8th Edition - Clinical stage from 11/30/2017: Stage I (cT2, cN1, cM0, p16+) - Signed by Eppie Gibson, MD on 11/30/2017    12/07/2017 Miscellaneous    ENT evaluation to [facility] recommended against surgical resection due to the tumor extending into the right inferior nasopharynx and posterior soft palate, which would require resection of almost 1/2 of the soft palate and the right nasopharyngeal wall extending into right posterior/lateral posterior wall.     Procedure    12/26/2017: port and PEG tube placement     12/12/2017 -  Chemotherapy    The patient had palonosetron (ALOXI) injection 0.25 mg, 0.25 mg, Intravenous,  Once, 2 of 3 cycles Administration: 0.25 mg (12/28/2017), 0.25  mg (01/25/2018) CISplatin (PLATINOL) 196 mg in sodium chloride 0.9 % 500 mL chemo infusion, 100 mg/m2 = 196 mg, Intravenous,  Once, 2 of 3 cycles Dose modification: 80 mg/m2 (original dose 100 mg/m2, Cycle 2, Reason: Dose not tolerated, Comment: Neutropenia) Administration: 196 mg (12/28/2017), 157 mg (01/25/2018) fosaprepitant (EMEND) 150 mg, dexamethasone (DECADRON) 12 mg in sodium chloride 0.9 % 145 mL IVPB, , Intravenous,  Once, 2 of 3 cycles Administration:  (12/28/2017),  (01/25/2018)  for chemotherapy treatment.      REVIEW OF SYSTEMS:   Constitutional: ( - ) fevers, ( - )  chills , ( - ) night sweats Eyes: ( - ) blurriness of vision, ( - ) double vision, ( - ) watery eyes Ears, nose, mouth, throat, and face: ( + ) mucositis, ( + ) sore throat Respiratory: ( - ) cough, ( - ) dyspnea, ( - ) wheezes Cardiovascular: ( - ) palpitation, ( - ) chest discomfort, ( - ) lower extremity swelling Gastrointestinal:  ( + ) nausea, ( + ) heartburn, ( - ) change in bowel habits Skin: ( - ) abnormal skin rashes Lymphatics: ( - ) new lymphadenopathy, ( - ) easy bruising Neurological: ( - ) numbness, ( - ) tingling, ( - ) new weaknesses, ( + ) tinnitus  Behavioral/Psych: ( - ) mood change, ( - ) new changes  All other systems were reviewed with the patient and are negative.  I have reviewed the past  medical history, past surgical history, social history and family history with the patient and they are unchanged from previous note.  ALLERGIES:  has No Known Allergies.  MEDICATIONS:  Current Outpatient Medications  Medication Sig Dispense Refill  . Baclofen 5 MG TABS Take 5 mg by mouth every 8 (eight) hours as needed.  0  . fentaNYL (DURAGESIC - DOSED MCG/HR) 25 MCG/HR patch Place 1 patch (25 mcg total) onto the skin every 3 (three) days. 10 patch 0  . lidocaine (XYLOCAINE) 2 % solution Patient: Mix 1part 2% viscous lidocaine, 1part H20. Swish & swallow 55mL of diluted mixture, 73min before meals and  at bedtime, up to QID 100 mL 5  . lidocaine-prilocaine (EMLA) cream Apply to affected area once 30 g 3  . LORazepam (ATIVAN) 0.5 MG tablet Take 1 tablet (0.5 mg total) by mouth every 6 (six) hours as needed (Nausea or vomiting). 30 tablet 0  . Nutritional Supplements (FEEDING SUPPLEMENT, OSMOLITE 1.5 CAL,) LIQD Begin 1.5 bottles Osmolite 1.5 via PEG QID with 60 mL free water before and after bolus feedings. Mix 30 mL Promod with 60 mL water BID and put into PEG. Flush with 30 mL water. Do not mix Promod with Osmolite 1.5 6 Bottle 0  . omeprazole (PRILOSEC) 40 MG capsule Take 1 capsule (40 mg total) by mouth daily. 30 capsule 5  . ondansetron (ZOFRAN) 8 MG tablet Take 1 tablet (8 mg total) by mouth every 8 (eight) hours as needed. Start on the third day after chemotherapy. 60 tablet 1  . oxyCODONE-acetaminophen (PERCOCET/ROXICET) 5-325 MG tablet   0  . prochlorperazine (COMPAZINE) 10 MG tablet Take 1 tablet (10 mg total) by mouth every 6 (six) hours as needed (Nausea or vomiting). 30 tablet 1  . scopolamine (TRANSDERM-SCOP) 1 MG/3DAYS Place 1 patch (1.5 mg total) onto the skin every 3 (three) days. 4 patch 3  . dexamethasone (DECADRON) 4 MG tablet Take 2 tablets by mouth once a day on the day after chemotherapy and then take 2 tablets two times a day for 2 days. Take with food. (Patient not taking: Reported on 01/30/2018) 30 tablet 1  . sodium fluoride (PREVIDENT 5000 PLUS) 1.1 % CREA dental cream Apply cream to tooth brush. Brush teeth for 2 minutes. Spit out excess. DO NOT rinse afterwards. Repeat nightly. (Patient not taking: Reported on 02/06/2018) 1 Tube prn   No current facility-administered medications for this visit.     PHYSICAL EXAMINATION: ECOG PERFORMANCE STATUS: 1 - Symptomatic but completely ambulatory  Today's Vitals   02/13/18 1529 02/13/18 1533  BP: 133/77   Pulse: 88   Resp: 18   Temp: 98.3 F (36.8 C)   TempSrc: Oral   SpO2: 99%   Weight: 158 lb 9.6 oz (71.9 kg)    Height: 5\' 10"  (1.778 m)   PainSc:  3    Body mass index is 22.76 kg/m.  Filed Weights   02/13/18 1529  Weight: 158 lb 9.6 oz (71.9 kg)    GENERAL: alert, no distress, pale-appearing SKIN: dark erythematous, raw-appearing skin over the neck and base of the cheek from radiation  EYES: conjunctiva are pink and non-injected, sclera clear OROPHARYNX: Grade 2 mucositis, a few shallow oral ulcers noted  NECK: supple, mild tenderness with palpation in the right neck LYMPH:  Slightly improving right cervical adenopathy, 2x2cm, no other palpable lymphadenopathy LUNGS: clear to auscultation and percussion with normal breathing effort HEART: regular rate & rhythm and no murmurs and no lower extremity edema  ABDOMEN: soft, non-tender, non-distended, normal bowel sounds Musculoskeletal: no cyanosis of digits and no clubbing  PSYCH: alert, speaking very little due to mucositis  NEURO: no focal motor/sensory deficits  LABORATORY DATA:  I have reviewed the data as listed    Component Value Date/Time   NA 139 02/13/2018 1506   K 4.1 02/13/2018 1506   CL 101 02/13/2018 1506   CO2 27 02/13/2018 1506   GLUCOSE 167 (H) 02/13/2018 1506   BUN 13 02/13/2018 1506   CREATININE 0.95 02/13/2018 1506   CALCIUM 9.3 02/13/2018 1506   PROT 7.3 12/12/2017 1426   ALBUMIN 4.2 12/12/2017 1426   AST 13 (L) 12/12/2017 1426   ALT 20 12/12/2017 1426   ALKPHOS 75 12/12/2017 1426   BILITOT 0.5 12/12/2017 1426   GFRNONAA >60 02/13/2018 1506   GFRAA >60 02/13/2018 1506    No results found for: SPEP, UPEP  Lab Results  Component Value Date   WBC 3.3 (L) 02/13/2018   NEUTROABS 3.0 02/13/2018   HGB 11.3 (L) 02/13/2018   HCT 33.4 (L) 02/13/2018   MCV 85.0 02/13/2018   PLT 147 (L) 02/13/2018      Chemistry      Component Value Date/Time   NA 139 02/13/2018 1506   K 4.1 02/13/2018 1506   CL 101 02/13/2018 1506   CO2 27 02/13/2018 1506   BUN 13 02/13/2018 1506   CREATININE 0.95 02/13/2018 1506       Component Value Date/Time   CALCIUM 9.3 02/13/2018 1506   ALKPHOS 75 12/12/2017 1426   AST 13 (L) 12/12/2017 1426   ALT 20 12/12/2017 1426   BILITOT 0.5 12/12/2017 1426

## 2018-02-12 NOTE — Telephone Encounter (Signed)
Pt's wife came to infusion area during pt's nutrition appt asking to have his port dressing redone, stating that home health had not done a good job securing it to him.  Pt's chest port dressing (port still accessed) was assessed by RN Learta Codding.  Changed dressing to Opsite per pt request/hx of sensitivity, applied new antimicrobial disc, and advised pt to have dressing re-checked at tomorrow's visit.  Verbalized understanding.

## 2018-02-13 ENCOUNTER — Inpatient Hospital Stay (HOSPITAL_BASED_OUTPATIENT_CLINIC_OR_DEPARTMENT_OTHER): Payer: BLUE CROSS/BLUE SHIELD | Admitting: Hematology

## 2018-02-13 ENCOUNTER — Encounter: Payer: Self-pay | Admitting: Hematology

## 2018-02-13 ENCOUNTER — Encounter: Payer: Self-pay | Admitting: Radiation Oncology

## 2018-02-13 ENCOUNTER — Ambulatory Visit
Admission: RE | Admit: 2018-02-13 | Discharge: 2018-02-13 | Disposition: A | Discharge status: Home / Self Care | Payer: BLUE CROSS/BLUE SHIELD | Source: Ambulatory Visit | Attending: Radiation Oncology | Admitting: Radiation Oncology

## 2018-02-13 ENCOUNTER — Inpatient Hospital Stay: Payer: BLUE CROSS/BLUE SHIELD

## 2018-02-13 ENCOUNTER — Telehealth: Payer: Self-pay

## 2018-02-13 VITALS — BP 133/77 | HR 88 | Temp 98.3°F | Resp 18 | Ht 70.0 in | Wt 158.6 lb

## 2018-02-13 DIAGNOSIS — E46 Unspecified protein-calorie malnutrition: Secondary | ICD-10-CM

## 2018-02-13 DIAGNOSIS — D701 Agranulocytosis secondary to cancer chemotherapy: Secondary | ICD-10-CM

## 2018-02-13 DIAGNOSIS — R110 Nausea: Secondary | ICD-10-CM

## 2018-02-13 DIAGNOSIS — D6959 Other secondary thrombocytopenia: Secondary | ICD-10-CM

## 2018-02-13 DIAGNOSIS — C090 Malignant neoplasm of tonsillar fossa: Secondary | ICD-10-CM

## 2018-02-13 DIAGNOSIS — K1231 Oral mucositis (ulcerative) due to antineoplastic therapy: Secondary | ICD-10-CM

## 2018-02-13 DIAGNOSIS — N179 Acute kidney failure, unspecified: Secondary | ICD-10-CM

## 2018-02-13 DIAGNOSIS — Z79899 Other long term (current) drug therapy: Secondary | ICD-10-CM

## 2018-02-13 DIAGNOSIS — E860 Dehydration: Secondary | ICD-10-CM

## 2018-02-13 DIAGNOSIS — Z931 Gastrostomy status: Secondary | ICD-10-CM | POA: Diagnosis not present

## 2018-02-13 DIAGNOSIS — Y842 Radiological procedure and radiotherapy as the cause of abnormal reaction of the patient, or of later complication, without mention of misadventure at the time of the procedure: Secondary | ICD-10-CM

## 2018-02-13 DIAGNOSIS — H9313 Tinnitus, bilateral: Secondary | ICD-10-CM

## 2018-02-13 DIAGNOSIS — T451X5S Adverse effect of antineoplastic and immunosuppressive drugs, sequela: Secondary | ICD-10-CM

## 2018-02-13 DIAGNOSIS — C770 Secondary and unspecified malignant neoplasm of lymph nodes of head, face and neck: Secondary | ICD-10-CM | POA: Diagnosis not present

## 2018-02-13 DIAGNOSIS — T451X5A Adverse effect of antineoplastic and immunosuppressive drugs, initial encounter: Secondary | ICD-10-CM

## 2018-02-13 DIAGNOSIS — M542 Cervicalgia: Secondary | ICD-10-CM

## 2018-02-13 DIAGNOSIS — R1310 Dysphagia, unspecified: Secondary | ICD-10-CM | POA: Diagnosis not present

## 2018-02-13 DIAGNOSIS — Z95828 Presence of other vascular implants and grafts: Secondary | ICD-10-CM

## 2018-02-13 DIAGNOSIS — L538 Other specified erythematous conditions: Secondary | ICD-10-CM

## 2018-02-13 LAB — CBC WITH DIFFERENTIAL (CANCER CENTER ONLY)
Abs Immature Granulocytes: 0 10*3/uL (ref 0.00–0.07)
Basophils Absolute: 0 10*3/uL (ref 0.0–0.1)
Basophils Relative: 0 %
Eosinophils Absolute: 0 10*3/uL (ref 0.0–0.5)
Eosinophils Relative: 0 %
HCT: 33.4 % — ABNORMAL LOW (ref 39.0–52.0)
Hemoglobin: 11.3 g/dL — ABNORMAL LOW (ref 13.0–17.0)
Immature Granulocytes: 0 %
Lymphocytes Relative: 2 %
Lymphs Abs: 0.1 10*3/uL — ABNORMAL LOW (ref 0.7–4.0)
MCH: 28.8 pg (ref 26.0–34.0)
MCHC: 33.8 g/dL (ref 30.0–36.0)
MCV: 85 fL (ref 80.0–100.0)
Monocytes Absolute: 0.3 10*3/uL (ref 0.1–1.0)
Monocytes Relative: 8 %
Neutro Abs: 3 10*3/uL (ref 1.7–7.7)
Neutrophils Relative %: 90 %
Platelet Count: 147 10*3/uL — ABNORMAL LOW (ref 150–400)
RBC: 3.93 MIL/uL — ABNORMAL LOW (ref 4.22–5.81)
RDW: 13.1 % (ref 11.5–15.5)
WBC Count: 3.3 10*3/uL — ABNORMAL LOW (ref 4.0–10.5)
nRBC: 0 % (ref 0.0–0.2)

## 2018-02-13 LAB — BASIC METABOLIC PANEL - CANCER CENTER ONLY
Anion gap: 11 (ref 5–15)
BUN: 13 mg/dL (ref 6–20)
CO2: 27 mmol/L (ref 22–32)
Calcium: 9.3 mg/dL (ref 8.9–10.3)
Chloride: 101 mmol/L (ref 98–111)
Creatinine: 0.95 mg/dL (ref 0.61–1.24)
GFR, Est AFR Am: >60 mL/min (ref >60)
GFR, Estimated: >60 mL/min (ref >60)
Glucose, Bld: 167 mg/dL — ABNORMAL HIGH (ref 70–99)
Potassium: 4.1 mmol/L (ref 3.5–5.1)
Sodium: 139 mmol/L (ref 135–145)

## 2018-02-13 LAB — MAGNESIUM: Magnesium: 2.4 mg/dL (ref 1.7–2.4)

## 2018-02-13 MED ORDER — SODIUM CHLORIDE 0.9% FLUSH
10.0000 mL | INTRAVENOUS | Status: DC | PRN
  Administered 2018-02-13: 10 mL
  Filled 2018-02-13: qty 10

## 2018-02-13 MED ORDER — HEPARIN SOD (PORK) LOCK FLUSH 100 UNIT/ML IV SOLN
500.0000 [IU] | Freq: Once | INTRAVENOUS | Status: DC | PRN
  Filled 2018-02-13: qty 5

## 2018-02-13 NOTE — Telephone Encounter (Signed)
Printed avs and calender of upcoming appointment. Per 11/27 los 

## 2018-02-15 ENCOUNTER — Inpatient Hospital Stay: Payer: BLUE CROSS/BLUE SHIELD

## 2018-02-20 ENCOUNTER — Telehealth: Payer: Self-pay | Admitting: Hematology

## 2018-02-20 ENCOUNTER — Encounter: Payer: Self-pay | Admitting: Hematology

## 2018-02-20 ENCOUNTER — Telehealth: Payer: Self-pay | Admitting: *Deleted

## 2018-02-20 ENCOUNTER — Inpatient Hospital Stay: Payer: BLUE CROSS/BLUE SHIELD | Attending: Hematology

## 2018-02-20 ENCOUNTER — Inpatient Hospital Stay (HOSPITAL_BASED_OUTPATIENT_CLINIC_OR_DEPARTMENT_OTHER): Payer: BLUE CROSS/BLUE SHIELD | Admitting: Hematology

## 2018-02-20 VITALS — BP 125/76 | HR 82 | Temp 98.3°F | Resp 18 | Ht 70.0 in | Wt 156.5 lb

## 2018-02-20 DIAGNOSIS — R110 Nausea: Secondary | ICD-10-CM

## 2018-02-20 DIAGNOSIS — Z95828 Presence of other vascular implants and grafts: Secondary | ICD-10-CM | POA: Diagnosis not present

## 2018-02-20 DIAGNOSIS — D72818 Other decreased white blood cell count: Secondary | ICD-10-CM

## 2018-02-20 DIAGNOSIS — T451X5S Adverse effect of antineoplastic and immunosuppressive drugs, sequela: Secondary | ICD-10-CM | POA: Diagnosis not present

## 2018-02-20 DIAGNOSIS — N2889 Other specified disorders of kidney and ureter: Secondary | ICD-10-CM | POA: Diagnosis not present

## 2018-02-20 DIAGNOSIS — E46 Unspecified protein-calorie malnutrition: Secondary | ICD-10-CM | POA: Insufficient documentation

## 2018-02-20 DIAGNOSIS — Z931 Gastrostomy status: Secondary | ICD-10-CM | POA: Insufficient documentation

## 2018-02-20 DIAGNOSIS — Z9221 Personal history of antineoplastic chemotherapy: Secondary | ICD-10-CM | POA: Insufficient documentation

## 2018-02-20 DIAGNOSIS — C090 Malignant neoplasm of tonsillar fossa: Secondary | ICD-10-CM

## 2018-02-20 DIAGNOSIS — D6481 Anemia due to antineoplastic chemotherapy: Secondary | ICD-10-CM | POA: Diagnosis not present

## 2018-02-20 DIAGNOSIS — C770 Secondary and unspecified malignant neoplasm of lymph nodes of head, face and neck: Secondary | ICD-10-CM

## 2018-02-20 DIAGNOSIS — Z79899 Other long term (current) drug therapy: Secondary | ICD-10-CM

## 2018-02-20 DIAGNOSIS — Z923 Personal history of irradiation: Secondary | ICD-10-CM

## 2018-02-20 DIAGNOSIS — H9313 Tinnitus, bilateral: Secondary | ICD-10-CM | POA: Diagnosis not present

## 2018-02-20 DIAGNOSIS — E860 Dehydration: Secondary | ICD-10-CM | POA: Insufficient documentation

## 2018-02-20 DIAGNOSIS — R112 Nausea with vomiting, unspecified: Secondary | ICD-10-CM

## 2018-02-20 DIAGNOSIS — N179 Acute kidney failure, unspecified: Secondary | ICD-10-CM

## 2018-02-20 DIAGNOSIS — K1231 Oral mucositis (ulcerative) due to antineoplastic therapy: Secondary | ICD-10-CM

## 2018-02-20 DIAGNOSIS — T451X5A Adverse effect of antineoplastic and immunosuppressive drugs, initial encounter: Secondary | ICD-10-CM

## 2018-02-20 DIAGNOSIS — K1239 Other oral mucositis (ulcerative): Secondary | ICD-10-CM | POA: Insufficient documentation

## 2018-02-20 DIAGNOSIS — D6959 Other secondary thrombocytopenia: Secondary | ICD-10-CM

## 2018-02-20 DIAGNOSIS — D701 Agranulocytosis secondary to cancer chemotherapy: Secondary | ICD-10-CM

## 2018-02-20 LAB — BASIC METABOLIC PANEL - CANCER CENTER ONLY
Anion gap: 9 (ref 5–15)
BUN: 13 mg/dL (ref 6–20)
CO2: 29 mmol/L (ref 22–32)
Calcium: 9.5 mg/dL (ref 8.9–10.3)
Chloride: 102 mmol/L (ref 98–111)
Creatinine: 0.9 mg/dL (ref 0.61–1.24)
GFR, Est AFR Am: >60 mL/min (ref >60)
GFR, Estimated: >60 mL/min (ref >60)
Glucose, Bld: 90 mg/dL (ref 70–99)
Potassium: 4.2 mmol/L (ref 3.5–5.1)
Sodium: 140 mmol/L (ref 135–145)

## 2018-02-20 LAB — CBC WITH DIFFERENTIAL (CANCER CENTER ONLY)
Abs Immature Granulocytes: 0.02 10*3/uL (ref 0.00–0.07)
Basophils Absolute: 0 10*3/uL (ref 0.0–0.1)
Basophils Relative: 0 %
Eosinophils Absolute: 0.1 10*3/uL (ref 0.0–0.5)
Eosinophils Relative: 2 %
HCT: 32.6 % — ABNORMAL LOW (ref 39.0–52.0)
Hemoglobin: 10.9 g/dL — ABNORMAL LOW (ref 13.0–17.0)
Immature Granulocytes: 1 %
Lymphocytes Relative: 5 %
Lymphs Abs: 0.2 10*3/uL — ABNORMAL LOW (ref 0.7–4.0)
MCH: 28.7 pg (ref 26.0–34.0)
MCHC: 33.4 g/dL (ref 30.0–36.0)
MCV: 85.8 fL (ref 80.0–100.0)
Monocytes Absolute: 0.6 10*3/uL (ref 0.1–1.0)
Monocytes Relative: 15 %
Neutro Abs: 3 10*3/uL (ref 1.7–7.7)
Neutrophils Relative %: 77 %
Platelet Count: 156 10*3/uL (ref 150–400)
RBC: 3.8 MIL/uL — ABNORMAL LOW (ref 4.22–5.81)
RDW: 13.5 % (ref 11.5–15.5)
WBC Count: 3.9 10*3/uL — ABNORMAL LOW (ref 4.0–10.5)
nRBC: 0 % (ref 0.0–0.2)

## 2018-02-20 LAB — MAGNESIUM: Magnesium: 2.2 mg/dL (ref 1.7–2.4)

## 2018-02-20 MED ORDER — HEPARIN SOD (PORK) LOCK FLUSH 100 UNIT/ML IV SOLN
500.0000 [IU] | Freq: Once | INTRAVENOUS | Status: AC
  Administered 2018-02-20: 500 [IU] via INTRAVENOUS
  Filled 2018-02-20: qty 5

## 2018-02-20 MED ORDER — SODIUM CHLORIDE 0.9% FLUSH
10.0000 mL | INTRAVENOUS | Status: DC | PRN
  Administered 2018-02-20: 10 mL via INTRAVENOUS
  Filled 2018-02-20: qty 10

## 2018-02-20 NOTE — Progress Notes (Signed)
[facility] OFFICE PROGRESS NOTE  Patient Care Team: Martinique, Betty G, MD as PCP - General (Family Medicine) Eppie Gibson, MD as Attending Physician (Radiation Oncology) Tish Men, MD as Consulting Physician (Hematology) Leota Sauers, RN as Oncology Nurse Navigator (Oncology) Karie Mainland, RD as Dietitian (Nutrition) Sharen Counter, CCC-SLP as Speech Language Pathologist (Speech Pathology) Wynelle Beckmann, Melodie Bouillon, PT as Physical Therapist (Physical Therapy)  HEME/ONC OVERVIEW: 1. 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, p16+ -For detailed oncologic hx, see below -Completed chemoradiation in the end of 01/2018 -Received 2 doses of cisplatin; 2nd dose reduced due to Grade 3 neutropenia; 3rd dose omitted due to Grade 3 bilateral tinnitus  -We will plan to obtain end-of-treatment PET 3-4 months after the completion of chemoradiation to assess for disease status   Chemotherapy-induced tinnitus -Patient reports 1-2x/day b/l tinnitus causing moderate hearing difficulty during these episodes; slightly improving  -We will reassess at the next visit and consider referral to audiology for hearing testing   Chemotherapy-induced nausea and vomiting -Exacerbated by thick mucus; slowly improving over the past a few days -Continue PRN Zofran, Compazine, dexamethasone and Ativan  Treatment-related mucositis -Patient reports moderate persistent pain due to mouth ulcers and mucositis -I encouraged patient to use liquid morphine as needed   Chemotherapy-induced leukopenia -Stable leukopenia  today; no neutropenia  -Patient denies any symptom of infection -We will continue monitor it for now  Chemotherapy-induced anemia -Hgb 10.9 today, stable -We will continue to monitor it for now -No indication for blood transfusion   Chemotherapy-induced thrombocytopenia - Resolved -Plts 156k today, improving  -Patient denies any abnormal bleeding or bruising -We will continue monitor for now  Chemotherapy-induced renal dysfunction -Cr normal today, stable -Secondary to dehydration and cisplatin-related nephrotoxicity -He is receiving home IV fluid (M-F, 1-2 L/day) -Weekly renal function monitoring   Protein malnutrition -The patient is currently taking approximately 4 cans of Osmolite per day -Home IV fluid initiated due to dehydration, 1-2L/day, M-F -I encouraged the patient to adhere to the enteral nutrition as prescribed by the dietician   All questions were answered. The patient knows to call the clinic with any problems, questions or concerns. No barriers to learning was detected.  A total of more than 25 minutes were spent face-to-face with the patient during this encounter and over half of that time was spent on counseling and coordination of care as outlined above.  Return in 2 week for labs and clinic follow-up.  Tish Men, MD 02/21/2018 9:13 AM  CHIEF COMPLAINT: "He is doing a little better"  INTERVAL HISTORY: Thomas Mccall returns to clinic for follow-up after completing chemoradiation in the end of 01/2018.  He has difficulty speaking due to mucositis and thick secretions, so the history was obtained from his wife and also via patient texting on his phone.  Patient has moderate mucositis pain, for which he takes liquid morphine 2-3 times a day via feeding tube with adequate pain control.  He has persistent thick secretions, which can precipitate gagging that then leads to vomiting up to 3-4 times per  day over the past 1 to 2 weeks.  Over the past 3 to 4 days, the emesis  has improved.  He is putting 4 cans of Ensure's through the feeding tube per day.  He still has moderate intermittent bilateral tinnitus, 1-2 times per day lasting several minutes each time.  During these episodes of tinnitus, he has difficulty with hearing, but when he is not having tinnitus, his hearing is relatively normal.  He denies any fever, chill, night sweats, chest pain, dyspnea, abdominal pain, diarrhea, or numbness/tingling in hands or feet.  SUMMARY OF ONCOLOGIC HISTORY:   Malignant neoplasm of tonsillar fossa ([facility])   10/30/2017 Miscellaneous    Presented to Dr. Redmond Baseman with 3 months of R neck mass    11/07/2017 Imaging    CT neck w/ contrast:  1. Right tonsillar mass measures up to 2.6 cm, highly concerning for a squamous cell carcinoma. 2. Enlarged homogeneous right level 2 lymph node measures up to 3.9 cm, consistent with metastatic disease. 3. Adjacent right level 3 lymph node measures 1.1 cm. 4. Lucency at C4 on the left is likely degenerative. Metastatic disease is not excluded. Please correlate with findings on PET scan if used for further evaluation.    11/12/2017 Procedure    R tonsillar biopsy by Dr. Janace Hoard    11/12/2017 Pathology Results    Accession: [id number] Invasive squamous cell carcinoma; p16 diffusely positive    11/28/2017 Imaging    PET scan: 1. Right tonsillar fossa mass is intensely hypermetabolic compatible with primary neoplasm. 2. Solitary enlarged and hypermetabolic right level 2 cervical lymph node compatible with metastatic adenopathy. 3. No evidence for FDG avid hypermetabolic distant metastasis.    11/30/2017 Cancer Staging    Staging form: Pharynx - HPV-Mediated Oropharynx, AJCC 8th Edition - Clinical stage from 11/30/2017: Stage I (cT2, cN1, cM0, p16+) - Signed by Eppie Gibson, MD on 11/30/2017    12/07/2017 Miscellaneous    ENT evaluation to [facility] recommended against surgical resection due to the tumor extending into the right inferior  nasopharynx and posterior soft palate, which would require resection of almost 1/2 of the soft palate and the right nasopharyngeal wall extending into right posterior/lateral posterior wall.     Procedure    12/26/2017: port and PEG tube placement     12/12/2017 -  Chemotherapy    The patient had palonosetron (ALOXI) injection 0.25 mg, 0.25 mg, Intravenous,  Once, 2 of 3 cycles Administration: 0.25 mg (12/28/2017), 0.25 mg (01/25/2018) CISplatin (PLATINOL) 196 mg in sodium chloride 0.9 % 500 mL chemo infusion, 100 mg/m2 = 196 mg, Intravenous,  Once, 2 of 3 cycles Dose modification: 80 mg/m2 (original dose 100 mg/m2, Cycle 2, Reason: Dose not tolerated, Comment: Neutropenia) Administration: 196 mg (12/28/2017), 157 mg (01/25/2018) fosaprepitant (EMEND) 150 mg, dexamethasone (DECADRON) 12 mg in sodium chloride 0.9 % 145 mL IVPB, , Intravenous,  Once, 2 of 3 cycles Administration:  (12/28/2017),  (01/25/2018)  for chemotherapy treatment.      REVIEW OF SYSTEMS:   Constitutional: ( - ) fevers, ( - )  chills , ( - ) night sweats Eyes: ( - ) blurriness of vision, ( - ) double vision, ( - ) watery eyes Ears, nose, mouth, throat, and face: ( + ) mucositis, ( + ) sore throat Respiratory: ( - ) cough, ( - ) dyspnea, ( - ) wheezes Cardiovascular: ( - ) palpitation, ( - ) chest discomfort, ( - ) lower extremity swelling Gastrointestinal:  ( + )  nausea, ( - ) heartburn, ( - ) change in bowel habits Skin: ( - ) abnormal skin rashes Lymphatics: ( - ) new lymphadenopathy, ( - ) easy bruising Neurological: ( - ) numbness, ( - ) tingling, ( - ) new weaknesses, ( + ) tinnitus  Behavioral/Psych: ( - ) mood change, ( - ) new changes  All other systems were reviewed with the patient and are negative.  I have reviewed the past medical history, past surgical history, social history and family history with the patient and they are unchanged from previous note.  ALLERGIES:  has No Known Allergies.  MEDICATIONS:   Current Outpatient Medications  Medication Sig Dispense Refill  . Baclofen 5 MG TABS Take 5 mg by mouth every 8 (eight) hours as needed.  0  . dexamethasone (DECADRON) 4 MG tablet Take 2 tablets by mouth once a day on the day after chemotherapy and then take 2 tablets two times a day for 2 days. Take with food. (Patient not taking: Reported on 01/30/2018) 30 tablet 1  . fentaNYL (DURAGESIC - DOSED MCG/HR) 25 MCG/HR patch Place 1 patch (25 mcg total) onto the skin every 3 (three) days. 10 patch 0  . lidocaine (XYLOCAINE) 2 % solution Patient: Mix 1part 2% viscous lidocaine, 1part H20. Swish & swallow 51mL of diluted mixture, 65min before meals and at bedtime, up to QID 100 mL 5  . lidocaine-prilocaine (EMLA) cream Apply to affected area once 30 g 3  . LORazepam (ATIVAN) 0.5 MG tablet Take 1 tablet (0.5 mg total) by mouth every 6 (six) hours as needed (Nausea or vomiting). 30 tablet 0  . Nutritional Supplements (FEEDING SUPPLEMENT, OSMOLITE 1.5 CAL,) LIQD Begin 1.5 bottles Osmolite 1.5 via PEG QID with 60 mL free water before and after bolus feedings. Mix 30 mL Promod with 60 mL water BID and put into PEG. Flush with 30 mL water. Do not mix Promod with Osmolite 1.5 6 Bottle 0  . omeprazole (PRILOSEC) 40 MG capsule Take 1 capsule (40 mg total) by mouth daily. 30 capsule 5  . oxyCODONE-acetaminophen (PERCOCET/ROXICET) 5-325 MG tablet   0  . prochlorperazine (COMPAZINE) 10 MG tablet Take 1 tablet (10 mg total) by mouth every 6 (six) hours as needed (Nausea or vomiting). 30 tablet 1  . scopolamine (TRANSDERM-SCOP) 1 MG/3DAYS Place 1 patch (1.5 mg total) onto the skin every 3 (three) days. 4 patch 3  . sodium fluoride (PREVIDENT 5000 PLUS) 1.1 % CREA dental cream Apply cream to tooth brush. Brush teeth for 2 minutes. Spit out excess. DO NOT rinse afterwards. Repeat nightly. (Patient not taking: Reported on 02/06/2018) 1 Tube prn   Current Facility-Administered Medications  Medication Dose Route Frequency  Provider Last Rate Last Dose  . sodium chloride flush (NS) 0.9 % injection 10 mL  10 mL Intravenous PRN Tish Men, MD   10 mL at 02/20/18 1558    PHYSICAL EXAMINATION: ECOG PERFORMANCE STATUS: 1 - Symptomatic but completely ambulatory  Today's Vitals   02/20/18 1538  BP: 125/76  Pulse: 82  Resp: 18  Temp: 98.3 F (36.8 C)  TempSrc: Oral  SpO2: 99%  Weight: 156 lb 8 oz (71 kg)  Height: 5\' 10"  (1.778 m)   Body mass index is 22.46 kg/m.  Filed Weights   02/20/18 1538  Weight: 156 lb 8 oz (71 kg)    GENERAL: alert, no distress SKIN: dark erythematous, raw-appearing skin over the neck and base of the cheek from radiation  EYES: conjunctiva  are pink and non-injected, sclera clear OROPHARYNX: white plaques over the tongue, c/w Grade 2 mucositis NECK: supple, mild tenderness with palpation in the right neck LYMPH:  Slightly improving right cervical adenopathy, 1.5x1.5cm, no other palpable lymphadenopathy LUNGS: clear to auscultation and percussion with normal breathing effort HEART: regular rate & rhythm and no murmurs and no lower extremity edema ABDOMEN: soft, non-tender, non-distended, normal bowel sounds Musculoskeletal: no cyanosis of digits and no clubbing  PSYCH: alert, speaking very little due to mucositis  NEURO: no focal motor/sensory deficits  LABORATORY DATA:  I have reviewed the data as listed    Component Value Date/Time   NA 140 02/20/2018 1545   K 4.2 02/20/2018 1545   CL 102 02/20/2018 1545   CO2 29 02/20/2018 1545   GLUCOSE 90 02/20/2018 1545   BUN 13 02/20/2018 1545   CREATININE 0.90 02/20/2018 1545   CALCIUM 9.5 02/20/2018 1545   PROT 7.3 12/12/2017 1426   ALBUMIN 4.2 12/12/2017 1426   AST 13 (L) 12/12/2017 1426   ALT 20 12/12/2017 1426   ALKPHOS 75 12/12/2017 1426   BILITOT 0.5 12/12/2017 1426   GFRNONAA >60 02/20/2018 1545   GFRAA >60 02/20/2018 1545    No results found for: SPEP, UPEP  Lab Results  Component Value Date   WBC 3.9 (L)  02/20/2018   NEUTROABS 3.0 02/20/2018   HGB 10.9 (L) 02/20/2018   HCT 32.6 (L) 02/20/2018   MCV 85.8 02/20/2018   PLT 156 02/20/2018      Chemistry      Component Value Date/Time   NA 140 02/20/2018 1545   K 4.2 02/20/2018 1545   CL 102 02/20/2018 1545   CO2 29 02/20/2018 1545   BUN 13 02/20/2018 1545   CREATININE 0.90 02/20/2018 1545      Component Value Date/Time   CALCIUM 9.5 02/20/2018 1545   ALKPHOS 75 12/12/2017 1426   AST 13 (L) 12/12/2017 1426   ALT 20 12/12/2017 1426   BILITOT 0.5 12/12/2017 1426

## 2018-02-20 NOTE — Telephone Encounter (Signed)
Patient declined avs and calendar  °

## 2018-02-20 NOTE — Telephone Encounter (Signed)
Notified Carolynn Sayers with [facility] to renew orders for 1-2 liters per day.

## 2018-02-22 ENCOUNTER — Other Ambulatory Visit: Payer: Self-pay | Admitting: Radiation Oncology

## 2018-02-22 ENCOUNTER — Ambulatory Visit: Payer: Self-pay | Admitting: Radiation Oncology

## 2018-02-22 ENCOUNTER — Encounter: Payer: Self-pay | Admitting: Physical Therapy

## 2018-02-22 ENCOUNTER — Encounter: Payer: Self-pay | Admitting: Hematology

## 2018-02-22 DIAGNOSIS — C090 Malignant neoplasm of tonsillar fossa: Secondary | ICD-10-CM

## 2018-02-22 NOTE — Progress Notes (Signed)
  Radiation Oncology         [phone] ________________________________  Name: Thomas Mccall MRN: [id number]  Date: 02/13/2018  DOB: Apr 05, 1960  End of Treatment Note  Diagnosis:   Right tonsil, HPV+, Stage I.   Malignant neoplasm of tonsillar fossa ([facility]) Staging form: Pharynx - HPV-Mediated Oropharynx, AJCC 8th Edition - Clinical stage from 11/30/2017: Stage I (cT2, cN1, cM0, p16+) - Signed by Eppie Gibson, MD on 11/30/2017  Indication for treatment:  Curative     Radiation treatment dates:   12/27/2017 - 02/13/2018  Site/dose:   Right tonsil and bilateral neck / 70 Gy in 35 fractions (total dose)  Beams/energy: IMRT / 6X  Narrative: The patient tolerated radiation treatment relatively well.   The patient developed thick saliva, confluent mucositis in throat, and dry, darkened skin over neck. He reports pain to his throat not relieved with morphine solution. He also reports nausea and vomiting related to thick sputum.   Plan: The patient has completed radiation treatment. Advised on methods to reduce thick saliva and prescribed scopolamine. The patient will return to radiation oncology clinic for routine followup in one month. I advised them to call or return sooner if they have any questions or concerns related to their recovery or treatment.  -----------------------------------  Eppie Gibson, MD   This document serves as a record of services personally performed by Eppie Gibson, MD. It was created on her behalf by Arlyce Harman, a trained medical scribe. The creation of this record is based on the scribe's personal observations and the provider's statements to them. This document has been checked and approved by the attending provider.

## 2018-02-22 NOTE — Progress Notes (Signed)
Thomas Mccall presents for follow up of radiation completed 02/13/18 to his right tonsil.   Pain issues, if any: He reports pain to the right side of his neck a 2/10 today. He is taking morphine solution concentrate with some relief.  Using a feeding tube?: Yes, he is instilling osmolite and water.  Weight changes, if any:  Wt Readings from Last 3 Encounters:  03/01/18 153 lb 6.4 oz (69.6 kg)  02/20/18 156 lb 8 oz (71 kg)  02/13/18 158 lb 9.6 oz (71.9 kg)   Swallowing issues, if any: he is only swallowing water by mouth  Smoking or chewing tobacco? No Using fluoride trays daily? No Last ENT visit was on: No Other notable issues, if any:  Dr. Maylon Peppers 02/20/18 next 03/06/18 He is receiving IVF Monday through Thursday at home.   BP 114/70 (BP Location: Right Arm, Patient Position: Sitting)   Pulse 79   Temp 97.9 F (36.6 C) (Oral)   Resp 20   Ht 5\' 10"  (1.778 m)   Wt 153 lb 6.4 oz (69.6 kg)   SpO2 100%   BMI 22.01 kg/m

## 2018-02-28 ENCOUNTER — Ambulatory Visit: Payer: BLUE CROSS/BLUE SHIELD | Attending: Radiation Oncology | Admitting: Physical Therapy

## 2018-02-28 ENCOUNTER — Other Ambulatory Visit: Payer: Self-pay

## 2018-02-28 DIAGNOSIS — R29898 Other symptoms and signs involving the musculoskeletal system: Secondary | ICD-10-CM | POA: Insufficient documentation

## 2018-02-28 DIAGNOSIS — M6281 Muscle weakness (generalized): Secondary | ICD-10-CM | POA: Insufficient documentation

## 2018-02-28 NOTE — Therapy (Addendum)
[place], [place], [address] Phone: [phone]   Fax:  [phone]  Physical Therapy Evaluation  Patient Details  Name: Thomas Mccall MRN: [id number] Date of Birth: 20-Mar-1961 Referring Provider (PT): Dr. Eppie Gibson   Encounter Date: 02/28/2018  PT End of Session - 03/08/18 0818    Visit Number  1    Number of Visits  13    Date for PT Re-Evaluation  04/19/18       Past Medical History:  Diagnosis Date  . Headache   . Mass    right tonsil  . Wears glasses     Past Surgical History:  Procedure Laterality Date  . DIRECT LARYNGOSCOPY Right 11/12/2017   Procedure: DIRECT LARYNGOSCOPY;  Surgeon: Melissa Montane, MD;  Location: [facility];  Service: ENT;  Laterality: Right;  Direct laryngoscopy and biopy of right tonsil mass  . IR GASTROSTOMY TUBE MOD SED  12/26/2017  . IR IMAGING GUIDED PORT INSERTION  12/26/2017  . TONSILLECTOMY AND ADENOIDECTOMY  1970  . WISDOM TOOTH EXTRACTION      There were no vitals filed for this visit.                  Objective measurements completed on examination: See above findings.                   PT Long Term Goals - 03/08/18 6962      PT LONG TERM GOAL #1   Title  Pt with increase the # of repetitions of sit to stand in 30 seconds to 13 indicating and impovement in general strength     Baseline  11    Time  6    Period  Weeks    Status  New      PT LONG TERM GOAL #2   Title  Pt will decrease the time to do a normal TUG to < 7.9 seconds indicating an improvment in functional mobilty to age matched norms    Baseline  8.65    Time  6    Period  Weeks    Status  New      PT LONG TERM GOAL #3   Title  Pt will be independent in a home exercise program for strength and endurance     Time  6    Period  Weeks    Status  New         Head and Neck Clinic Goals - 02/28/18 1715      Patient will be able to verbalize  understanding of a home exercise program for cervical range of motion, posture, and walking.    Status  Achieved           Patient will benefit from skilled therapeutic intervention in order to improve the following deficits and impairments:  Other (comment), Decreased mobility, Decreased knowledge of precautions, Decreased endurance, Decreased range of motion  Visit Diagnosis: Muscle weakness (generalized) - Plan: PT plan of care cert/re-cert, PT plan of care cert/re-cert  Other symptoms and signs involving the musculoskeletal system - Plan: PT plan of care cert/re-cert, PT plan of care cert/re-cert     Problem List Patient Active Problem List   Diagnosis Date Noted  . Tinnitus 02/06/2018  . Leukopenia due to antineoplastic chemotherapy ([facility]) 01/30/2018  . Chemotherapy-induced thrombocytopenia 01/24/2018  . AKI (acute kidney injury) ([facility]) 01/17/2018  . Chemotherapy-induced nausea 01/17/2018  . Dysgeusia 01/17/2018  . Malnutrition ([facility]) 01/17/2018  .  Mucositis due to chemotherapy 01/17/2018  . Port-A-Cath in place 01/09/2018  . Malignant neoplasm of tonsillar fossa ([facility]) 11/30/2017    Addendum:  Pt called back saying he changed his mind and wants to come to PT to get stronger.  He is still concerned about missing time from work.  He wants to come to [facility] since that is closer to his home.  Goals were added and certification was sent.  Pt will be called from [facility] to be scheduled.  Donato Heinz. Owens Shark PT  Norwood Levo 03/08/2018, 8:27 AM    [facility] [place], [place], [address] Phone: [phone]   Fax:  [phone]  Name: Thomas Mccall MRN: [id number] Date of Birth: [birth year]

## 2018-03-01 ENCOUNTER — Ambulatory Visit
Admission: RE | Admit: 2018-03-01 | Discharge: 2018-03-01 | Disposition: A | Discharge status: Home / Self Care | Payer: BLUE CROSS/BLUE SHIELD | Source: Ambulatory Visit | Attending: Radiation Oncology | Admitting: Radiation Oncology

## 2018-03-01 ENCOUNTER — Encounter: Payer: Self-pay | Admitting: Radiation Oncology

## 2018-03-01 ENCOUNTER — Other Ambulatory Visit: Payer: Self-pay

## 2018-03-01 DIAGNOSIS — Z931 Gastrostomy status: Secondary | ICD-10-CM | POA: Diagnosis not present

## 2018-03-01 DIAGNOSIS — C090 Malignant neoplasm of tonsillar fossa: Secondary | ICD-10-CM | POA: Insufficient documentation

## 2018-03-01 DIAGNOSIS — Z79899 Other long term (current) drug therapy: Secondary | ICD-10-CM | POA: Diagnosis not present

## 2018-03-01 NOTE — Progress Notes (Signed)
Radiation Oncology         [phone] ________________________________  Name: Thomas Mccall MRN: [id number]  Date: 03/01/2018  DOB: January 31, 1961  Follow-Up Visit Note  CC: Martinique, Betty G, MD  Melissa Montane, MD  Diagnosis and Prior Radiotherapy:       ICD-10-CM   1. Malignant neoplasm of tonsillar fossa ([place]) C09.0     CHIEF COMPLAINT:  Here for follow-up and surveillance of right tonsil cancer  Narrative:  The patient returns today for routine follow-up. He is accompanied by his wife. He has returned to work part time. He is scheduled to follow up with Dr. Maylon Peppers next week. He underwent his first session of physical therapy yesterday, 02/28/18.  Pain issues, if any: He reports pain to the right side of his neck rated 2/10.  Weight changes, if any: Wt Readings from Last 3 Encounters:  03/01/18 153 lb 6.4 oz (69.6 kg)  02/20/18 156 lb 8 oz (71 kg)  02/13/18 158 lb 9.6 oz (71.9 kg)   Swallowing issues, if any: swallowing only water by mouth Using a feeding tube?: Yes, for osmolite and water  Using fluoride trays daily?: No  Last ENT visit: Prior to treatment, 12/14/17 (Dr. Luis Abed)  Other notable issues, if any: He is still receiving IV fluids at home.                     ALLERGIES:  has No Known Allergies.  Meds: Current Outpatient Medications  Medication Sig Dispense Refill  . Baclofen 5 MG TABS Take 5 mg by mouth every 8 (eight) hours as needed.  0  . lidocaine-prilocaine (EMLA) cream Apply to affected area once 30 g 3  . LORazepam (ATIVAN) 0.5 MG tablet Take 1 tablet (0.5 mg total) by mouth every 6 (six) hours as needed (Nausea or vomiting). 30 tablet 0  . Morphine Sulfate (MORPHINE CONCENTRATE) 10 mg / 0.5 ml concentrated solution Place 10 mg into feeding tube every 3 (three) hours as needed for moderate pain or severe pain.    . Nutritional Supplements (FEEDING SUPPLEMENT, OSMOLITE 1.5 CAL,) LIQD Begin 1.5 bottles Osmolite 1.5 via PEG QID with 60 mL free water  before and after bolus feedings. Mix 30 mL Promod with 60 mL water BID and put into PEG. Flush with 30 mL water. Do not mix Promod with Osmolite 1.5 6 Bottle 0  . ondansetron (ZOFRAN) 8 MG tablet Take 8 mg by mouth every 8 (eight) hours as needed for nausea or vomiting.    . prochlorperazine (COMPAZINE) 10 MG tablet Take 1 tablet (10 mg total) by mouth every 6 (six) hours as needed (Nausea or vomiting). 30 tablet 1  . dexamethasone (DECADRON) 4 MG tablet Take 2 tablets by mouth once a day on the day after chemotherapy and then take 2 tablets two times a day for 2 days. Take with food. (Patient not taking: Reported on 01/30/2018) 30 tablet 1  . fentaNYL (DURAGESIC - DOSED MCG/HR) 25 MCG/HR patch Place 1 patch (25 mcg total) onto the skin every 3 (three) days. (Patient not taking: Reported on 03/01/2018) 10 patch 0  . lidocaine (XYLOCAINE) 2 % solution Patient: Mix 1part 2% viscous lidocaine, 1part H20. Swish & swallow 65mL of diluted mixture, 55min before meals and at bedtime, up to QID (Patient not taking: Reported on 03/01/2018) 100 mL 5  . omeprazole (PRILOSEC) 40 MG capsule Take 1 capsule (40 mg total) by mouth daily. (Patient not taking: Reported on 03/01/2018) 30 capsule  5  . oxyCODONE-acetaminophen (PERCOCET/ROXICET) 5-325 MG tablet   0  . scopolamine (TRANSDERM-SCOP) 1 MG/3DAYS Place 1 patch (1.5 mg total) onto the skin every 3 (three) days. (Patient not taking: Reported on 03/01/2018) 4 patch 3  . sodium fluoride (PREVIDENT 5000 PLUS) 1.1 % CREA dental cream Apply cream to tooth brush. Brush teeth for 2 minutes. Spit out excess. DO NOT rinse afterwards. Repeat nightly. (Patient not taking: Reported on 02/06/2018) 1 Tube prn   No current facility-administered medications for this encounter.     Physical Findings: The patient is in no acute distress. Patient is alert and oriented. Wt Readings from Last 3 Encounters:  03/01/18 153 lb 6.4 oz (69.6 kg)  02/20/18 156 lb 8 oz (71 kg)  02/13/18  158 lb 9.6 oz (71.9 kg)    height is 5\' 10"  (1.778 m) and weight is 153 lb 6.4 oz (69.6 kg). His oral temperature is 97.9 F (36.6 C). His blood pressure is 114/70 and his pulse is 79. His respiration is 20 and oxygen saturation is 100%. .  General: Alert and oriented, in no acute distress HEENT: Head is normocephalic. Extraocular movements are intact. Brisk gag reflex. Resolving mucositis. Neck: Neck is notable for the right upper neck nodule being smaller but still palpable. Skin: Skin in treatment fields shows satisfactory healing, still dry over his neck. Heart: Regular in rate and rhythm with no murmurs, rubs, or gallops. Chest: Clear to auscultation bilaterally, with no rhonchi, wheezes, or rales. Lymphatics: see Neck Exam Psychiatric: Judgment and insight are intact. Affect is appropriate.   Lab Findings: Lab Results  Component Value Date   WBC 3.9 (L) 02/20/2018   HGB 10.9 (L) 02/20/2018   HCT 32.6 (L) 02/20/2018   MCV 85.8 02/20/2018   PLT 156 02/20/2018    Lab Results  Component Value Date   TSH 0.550 12/12/2017    Radiographic Findings: No results found.  Impression/Plan:  Right Tonsil Squamous Cell Carcinoma  1) Head and Neck Cancer Status: Healing  2) Nutritional Status: Slight weight loss, still using feeding tube for all nutrition. PEG tube: yes  3) Risk Factors: The patient has been educated about risk factors including alcohol and tobacco abuse; they understand that avoidance of alcohol and tobacco is important to prevent recurrences as well as other cancers  4) Swallowing: Patient is only able to swallow water at this time. Continue SLP  5) Dental: Encouraged to continue regular followup with dentistry, and dental hygiene including fluoride rinses.   6) Thyroid function: recheck at next visit Lab Results  Component Value Date   TSH 0.550 12/12/2017    7) Other: I will order a PET scan for late February/early March prior to his follow up.  8)  Follow-up in 2-3 months. The patient was encouraged to call with any issues or questions before then.  I spent 20 minutes  face to face with the patient and more than 50% of that time was spent in counseling and/or coordination of care. _____________________________________   Eppie Gibson, MD  This document serves as a record of services personally performed by Eppie Gibson, MD. It was created on her behalf by Wilburn Mylar, a trained medical scribe. The creation of this record is based on the scribe's personal observations and the provider's statements to them. This document has been checked and approved by the attending provider.

## 2018-03-01 NOTE — Progress Notes (Deleted)
.  ssc 

## 2018-03-04 ENCOUNTER — Ambulatory Visit: Payer: BLUE CROSS/BLUE SHIELD

## 2018-03-05 ENCOUNTER — Encounter: Payer: Self-pay | Admitting: Radiation Oncology

## 2018-03-05 ENCOUNTER — Other Ambulatory Visit: Payer: Self-pay | Admitting: Radiation Oncology

## 2018-03-05 ENCOUNTER — Telehealth: Payer: Self-pay | Admitting: *Deleted

## 2018-03-05 DIAGNOSIS — Z1329 Encounter for screening for other suspected endocrine disorder: Secondary | ICD-10-CM

## 2018-03-05 DIAGNOSIS — C090 Malignant neoplasm of tonsillar fossa: Secondary | ICD-10-CM

## 2018-03-05 DIAGNOSIS — R5382 Chronic fatigue, unspecified: Secondary | ICD-10-CM

## 2018-03-05 NOTE — Telephone Encounter (Signed)
Called [facility] and left message on voice mail re:  OK per Dr. Maylon Peppers for pt to continue receiving home IVF with [facility].  Verbal telephone order given to [facility].

## 2018-03-05 NOTE — Telephone Encounter (Signed)
Okay to proceed.   Thanks.  Punta Santiago

## 2018-03-05 NOTE — Telephone Encounter (Signed)
Sharyn Lull from [facility] called requesting a verbal order for continuing of home IVF Twice a week - Monday to access port and Friday to deaccess port.   [facility]    Phone    [phone].

## 2018-03-06 ENCOUNTER — Telehealth: Payer: Self-pay

## 2018-03-06 ENCOUNTER — Inpatient Hospital Stay: Payer: BLUE CROSS/BLUE SHIELD

## 2018-03-06 ENCOUNTER — Inpatient Hospital Stay (HOSPITAL_BASED_OUTPATIENT_CLINIC_OR_DEPARTMENT_OTHER): Payer: BLUE CROSS/BLUE SHIELD | Admitting: Hematology

## 2018-03-06 ENCOUNTER — Encounter: Payer: Self-pay | Admitting: Hematology

## 2018-03-06 VITALS — BP 113/70 | HR 79 | Temp 98.1°F | Resp 14 | Ht 70.0 in | Wt 156.5 lb

## 2018-03-06 DIAGNOSIS — H9313 Tinnitus, bilateral: Secondary | ICD-10-CM

## 2018-03-06 DIAGNOSIS — C090 Malignant neoplasm of tonsillar fossa: Secondary | ICD-10-CM

## 2018-03-06 DIAGNOSIS — T451X5A Adverse effect of antineoplastic and immunosuppressive drugs, initial encounter: Secondary | ICD-10-CM

## 2018-03-06 DIAGNOSIS — Z79899 Other long term (current) drug therapy: Secondary | ICD-10-CM

## 2018-03-06 DIAGNOSIS — E46 Unspecified protein-calorie malnutrition: Secondary | ICD-10-CM

## 2018-03-06 DIAGNOSIS — D6481 Anemia due to antineoplastic chemotherapy: Secondary | ICD-10-CM

## 2018-03-06 DIAGNOSIS — C770 Secondary and unspecified malignant neoplasm of lymph nodes of head, face and neck: Secondary | ICD-10-CM

## 2018-03-06 DIAGNOSIS — D701 Agranulocytosis secondary to cancer chemotherapy: Secondary | ICD-10-CM

## 2018-03-06 DIAGNOSIS — Z95828 Presence of other vascular implants and grafts: Secondary | ICD-10-CM

## 2018-03-06 DIAGNOSIS — R112 Nausea with vomiting, unspecified: Secondary | ICD-10-CM

## 2018-03-06 DIAGNOSIS — N2889 Other specified disorders of kidney and ureter: Secondary | ICD-10-CM

## 2018-03-06 DIAGNOSIS — D72818 Other decreased white blood cell count: Secondary | ICD-10-CM

## 2018-03-06 DIAGNOSIS — K1239 Other oral mucositis (ulcerative): Secondary | ICD-10-CM

## 2018-03-06 DIAGNOSIS — E860 Dehydration: Secondary | ICD-10-CM

## 2018-03-06 DIAGNOSIS — N179 Acute kidney failure, unspecified: Secondary | ICD-10-CM

## 2018-03-06 DIAGNOSIS — T451X5S Adverse effect of antineoplastic and immunosuppressive drugs, sequela: Secondary | ICD-10-CM

## 2018-03-06 DIAGNOSIS — K1231 Oral mucositis (ulcerative) due to antineoplastic therapy: Secondary | ICD-10-CM

## 2018-03-06 LAB — BASIC METABOLIC PANEL - CANCER CENTER ONLY
Anion gap: 7 (ref 5–15)
BUN: 15 mg/dL (ref 6–20)
CO2: 28 mmol/L (ref 22–32)
Calcium: 9.2 mg/dL (ref 8.9–10.3)
Chloride: 104 mmol/L (ref 98–111)
Creatinine: 0.82 mg/dL (ref 0.61–1.24)
GFR, Est AFR Am: >60 mL/min (ref >60)
GFR, Estimated: >60 mL/min (ref >60)
Glucose, Bld: 154 mg/dL — ABNORMAL HIGH (ref 70–99)
Potassium: 3.7 mmol/L (ref 3.5–5.1)
Sodium: 139 mmol/L (ref 135–145)

## 2018-03-06 LAB — CBC WITH DIFFERENTIAL (CANCER CENTER ONLY)
Abs Immature Granulocytes: 0.01 10*3/uL (ref 0.00–0.07)
Basophils Absolute: 0 10*3/uL (ref 0.0–0.1)
Basophils Relative: 0 %
Eosinophils Absolute: 0 10*3/uL (ref 0.0–0.5)
Eosinophils Relative: 1 %
HCT: 30.4 % — ABNORMAL LOW (ref 39.0–52.0)
Hemoglobin: 10 g/dL — ABNORMAL LOW (ref 13.0–17.0)
Immature Granulocytes: 0 %
Lymphocytes Relative: 7 %
Lymphs Abs: 0.2 10*3/uL — ABNORMAL LOW (ref 0.7–4.0)
MCH: 29 pg (ref 26.0–34.0)
MCHC: 32.9 g/dL (ref 30.0–36.0)
MCV: 88.1 fL (ref 80.0–100.0)
Monocytes Absolute: 0.4 10*3/uL (ref 0.1–1.0)
Monocytes Relative: 12 %
Neutro Abs: 2.3 10*3/uL (ref 1.7–7.7)
Neutrophils Relative %: 80 %
Platelet Count: 166 10*3/uL (ref 150–400)
RBC: 3.45 MIL/uL — ABNORMAL LOW (ref 4.22–5.81)
RDW: 14.2 % (ref 11.5–15.5)
WBC Count: 2.9 10*3/uL — ABNORMAL LOW (ref 4.0–10.5)
nRBC: 0 % (ref 0.0–0.2)

## 2018-03-06 LAB — MAGNESIUM: Magnesium: 2 mg/dL (ref 1.7–2.4)

## 2018-03-06 MED ORDER — SODIUM CHLORIDE 0.9% FLUSH
10.0000 mL | INTRAVENOUS | Status: DC | PRN
  Filled 2018-03-06: qty 10

## 2018-03-06 MED ORDER — HEPARIN SOD (PORK) LOCK FLUSH 100 UNIT/ML IV SOLN
500.0000 [IU] | Freq: Once | INTRAVENOUS | Status: DC | PRN
  Filled 2018-03-06: qty 5

## 2018-03-06 NOTE — Telephone Encounter (Signed)
Printed avs and calender of upcoming appointment. Per 12/18 los 

## 2018-03-06 NOTE — Progress Notes (Signed)
[place] OFFICE PROGRESS NOTE  Patient Care Team: Martinique, Betty G, MD as PCP - General (Family Medicine) Eppie Gibson, MD as Attending Physician (Radiation Oncology) Tish Men, MD as Consulting Physician (Hematology) Leota Sauers, RN as Oncology Nurse Navigator (Oncology) Karie Mainland, RD as Dietitian (Nutrition) Sharen Counter, CCC-SLP as Speech Language Pathologist (Speech Pathology) Wynelle Beckmann, Melodie Bouillon, PT as Physical Therapist (Physical Therapy)  HEME/ONC OVERVIEW: 1. 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, p16+ -For detailed oncologic hx, see below -Completed chemoradiation in the end of 01/2018 -Received 2 doses of cisplatin; 2nd dose reduced due to Grade 3 neutropenia; 3rd dose omitted due to Grade 3 bilateral tinnitus  -Patient reports improving oral pain and slight increase in oral intake since completing chemoradiation -Post-treatment PET tentatively ordered for early 05/2018 -I encouraged the patient to adhere to jaw and shoulder exercises, and to increase PO intake as tolerated  Chemotherapy-induced tinnitus -Slowly improving -We will plan to repeat audiology study after the next visit to assess hearing   Chemotherapy-induced nausea and vomiting -Imrpoving -Continue PRN Zofran, Compazine, dexamethasone and Ativan  Treatment-related mucositis -Patient reports mild intermittent pain with swallowing, overall improving -Continue PRN morphine   Chemotherapy-induced leukopenia -Stable leukopenia today; no  neutropenia  -Patient denies any symptom of infection -We will continue monitor it for now  Chemotherapy-induced anemia -Hgb 10.0 today, stable -We will continue to monitor it for now -No indication for blood transfusion   Chemotherapy-induced renal dysfunction -Cr normal today, stable -Secondary to dehydration and cisplatin-related nephrotoxicity -He is receiving home IV fluid (M-F, 1-2 L/day) -We will monitor the renal function closely  Protein malnutrition, weight loss -The patient is currently taking approximately 4 cans of Osmolite per day -Home IV fluid initiated due to dehydration, 1-2L/day, M-F -I encouraged the patient to increase his oral intake as tolerated -If he maintains his weight and is able to increase his PO intake, we will consider stopping home IV fluid at the next visit   All questions were answered. The patient knows to call the clinic with any problems, questions or concerns. No barriers to learning was detected.  A total of more than 25 minutes were spent face-to-face with the patient during this encounter and over half of that time was spent on counseling and coordination of care as outlined above.  Return on 03/26/2018 for labs and clinic follow-up.   Tish Men, MD 03/06/2018 3:50 PM  CHIEF COMPLAINT: "I am doing a little better"  INTERVAL HISTORY: Mr. Landess returns to clinic for follow-up after completing chemoradiation in the end of 01/2018.  Patient reports that since last visit, the sore throat has gotten somewhat better, and he has to only occasionally use liquid morphine.  He is still having problems with thick saliva.  He has been trying to eat small amount of food by mouth, including some chicken fried rice earlier today, which she did okay without significant dysphasia or odynophagia.  His weight is overall stable.  He puts 4 to 5 cans of tube feed through  his feeding tube per day.  He is still receiving home IV fluids 1 to 2 L/day Monday through  Friday.  He denies any fever, chill, night sweats, lymphadenopathy, chest pain, dyspnea, abdominal pain, nausea, vomiting, or diarrhea.  SUMMARY OF ONCOLOGIC HISTORY:   Malignant neoplasm of tonsillar fossa ([place])   10/30/2017 Miscellaneous    Presented to Dr. Redmond Baseman with 3 months of R neck mass    11/07/2017 Imaging    CT neck w/ contrast:  1. Right tonsillar mass measures up to 2.6 cm, highly concerning for a squamous cell carcinoma. 2. Enlarged homogeneous right level 2 lymph node measures up to 3.9 cm, consistent with metastatic disease. 3. Adjacent right level 3 lymph node measures 1.1 cm. 4. Lucency at C4 on the left is likely degenerative. Metastatic disease is not excluded. Please correlate with findings on PET scan if used for further evaluation.    11/12/2017 Procedure    R tonsillar biopsy by Dr. Janace Hoard    11/12/2017 Pathology Results    Accession: [id number] Invasive squamous cell carcinoma; p16 diffusely positive    11/28/2017 Imaging    PET scan: 1. Right tonsillar fossa mass is intensely hypermetabolic compatible with primary neoplasm. 2. Solitary enlarged and hypermetabolic right level 2 cervical lymph node compatible with metastatic adenopathy. 3. No evidence for FDG avid hypermetabolic distant metastasis.    11/30/2017 Cancer Staging    Staging form: Pharynx - HPV-Mediated Oropharynx, AJCC 8th Edition - Clinical stage from 11/30/2017: Stage I (cT2, cN1, cM0, p16+) - Signed by Eppie Gibson, MD on 11/30/2017    12/07/2017 Miscellaneous    ENT evaluation to [facility] recommended against surgical resection due to the tumor extending into the right inferior nasopharynx and posterior soft palate, which would require resection of almost 1/2 of the soft palate and the right nasopharyngeal wall extending into right posterior/lateral posterior wall.     Procedure    12/26/2017: port and PEG tube placement     12/12/2017 -  Chemotherapy    The patient had palonosetron (ALOXI)  injection 0.25 mg, 0.25 mg, Intravenous,  Once, 2 of 3 cycles Administration: 0.25 mg (12/28/2017), 0.25 mg (01/25/2018) CISplatin (PLATINOL) 196 mg in sodium chloride 0.9 % 500 mL chemo infusion, 100 mg/m2 = 196 mg, Intravenous,  Once, 2 of 3 cycles Dose modification: 80 mg/m2 (original dose 100 mg/m2, Cycle 2, Reason: Dose not tolerated, Comment: Neutropenia) Administration: 196 mg (12/28/2017), 157 mg (01/25/2018) fosaprepitant (EMEND) 150 mg, dexamethasone (DECADRON) 12 mg in sodium chloride 0.9 % 145 mL IVPB, , Intravenous,  Once, 2 of 3 cycles Administration:  (12/28/2017),  (01/25/2018)  for chemotherapy treatment.      REVIEW OF SYSTEMS:   Constitutional: ( - ) fevers, ( - )  chills , ( - ) night sweats Eyes: ( - ) blurriness of vision, ( - ) double vision, ( - ) watery eyes Ears, nose, mouth, throat, and face: ( + ) mucositis, ( + ) sore throat Respiratory: ( - ) cough, ( - ) dyspnea, ( - ) wheezes Cardiovascular: ( - ) palpitation, ( - ) chest discomfort, ( - ) lower extremity swelling Gastrointestinal:  ( + ) nausea, ( - ) heartburn, ( - ) change in bowel habits Skin: ( - ) abnormal skin rashes Lymphatics: ( - ) new lymphadenopathy, ( - ) easy bruising Neurological: ( - ) numbness, ( - ) tingling, ( - ) new weaknesses, ( + ) tinnitus  Behavioral/Psych: ( - ) mood change, ( - )  new changes  All other systems were reviewed with the patient and are negative.  I have reviewed the past medical history, past surgical history, social history and family history with the patient and they are unchanged from previous note.  ALLERGIES:  has No Known Allergies.  MEDICATIONS:  Current Outpatient Medications  Medication Sig Dispense Refill  . lidocaine-prilocaine (EMLA) cream Apply to affected area once 30 g 3  . Morphine Sulfate (MORPHINE CONCENTRATE) 10 mg / 0.5 ml concentrated solution Place 10 mg into feeding tube every 3 (three) hours as needed for moderate pain or severe pain.    .  Nutritional Supplements (FEEDING SUPPLEMENT, OSMOLITE 1.5 CAL,) LIQD Begin 1.5 bottles Osmolite 1.5 via PEG QID with 60 mL free water before and after bolus feedings. Mix 30 mL Promod with 60 mL water BID and put into PEG. Flush with 30 mL water. Do not mix Promod with Osmolite 1.5 6 Bottle 0  . lidocaine (XYLOCAINE) 2 % solution Patient: Mix 1part 2% viscous lidocaine, 1part H20. Swish & swallow 75mL of diluted mixture, 71min before meals and at bedtime, up to QID (Patient not taking: Reported on 03/01/2018) 100 mL 5  . ondansetron (ZOFRAN) 8 MG tablet Take 8 mg by mouth every 8 (eight) hours as needed for nausea or vomiting.    . prochlorperazine (COMPAZINE) 10 MG tablet Take 1 tablet (10 mg total) by mouth every 6 (six) hours as needed (Nausea or vomiting). (Patient not taking: Reported on 03/06/2018) 30 tablet 1   No current facility-administered medications for this visit.     PHYSICAL EXAMINATION: ECOG PERFORMANCE STATUS: 1 - Symptomatic but completely ambulatory  Today's Vitals   03/06/18 1526 03/06/18 1531  BP: 113/70   Pulse: 79   Resp: 14   Temp: 98.1 F (36.7 C)   TempSrc: Oral   SpO2: 100%   Weight: 156 lb 8 oz (71 kg)   Height: 5\' 10"  (1.778 m)   PainSc:  0-No pain   Body mass index is 22.46 kg/m.  Filed Weights   03/06/18 1526  Weight: 156 lb 8 oz (71 kg)    GENERAL: alert, no distress, comfortable SKIN: erythema over the neck area has resolved  EYES: conjunctiva are pink and non-injected, sclera clear OROPHARYNX: small patches of white plaques over the tongue, improving NECK: supple, mild tenderness with palpation in the right neck LYMPH:  improving right cervical adenopathy, ~1cm, no other palpable lymphadenopathy LUNGS: clear to auscultation and percussion with normal breathing effort HEART: regular rate & rhythm and no murmurs and no lower extremity edema ABDOMEN: soft, non-tender, non-distended, normal bowel sounds Musculoskeletal: no cyanosis of digits and  no clubbing  PSYCH: alert, slightly mumbled but improving speech NEURO: no focal motor/sensory deficits  LABORATORY DATA:  I have reviewed the data as listed    Component Value Date/Time   NA 140 02/20/2018 1545   K 4.2 02/20/2018 1545   CL 102 02/20/2018 1545   CO2 29 02/20/2018 1545   GLUCOSE 90 02/20/2018 1545   BUN 13 02/20/2018 1545   CREATININE 0.90 02/20/2018 1545   CALCIUM 9.5 02/20/2018 1545   PROT 7.3 12/12/2017 1426   ALBUMIN 4.2 12/12/2017 1426   AST 13 (L) 12/12/2017 1426   ALT 20 12/12/2017 1426   ALKPHOS 75 12/12/2017 1426   BILITOT 0.5 12/12/2017 1426   GFRNONAA >60 02/20/2018 1545   GFRAA >60 02/20/2018 1545    No results found for: SPEP, UPEP  Lab Results  Component Value Date  WBC 2.9 (L) 03/06/2018   NEUTROABS 2.3 03/06/2018   HGB 10.0 (L) 03/06/2018   HCT 30.4 (L) 03/06/2018   MCV 88.1 03/06/2018   PLT 166 03/06/2018      Chemistry      Component Value Date/Time   NA 140 02/20/2018 1545   K 4.2 02/20/2018 1545   CL 102 02/20/2018 1545   CO2 29 02/20/2018 1545   BUN 13 02/20/2018 1545   CREATININE 0.90 02/20/2018 1545      Component Value Date/Time   CALCIUM 9.5 02/20/2018 1545   ALKPHOS 75 12/12/2017 1426   AST 13 (L) 12/12/2017 1426   ALT 20 12/12/2017 1426   BILITOT 0.5 12/12/2017 1426

## 2018-03-07 ENCOUNTER — Telehealth: Payer: Self-pay | Admitting: *Deleted

## 2018-03-07 NOTE — Telephone Encounter (Signed)
[name] with [facility] left voicemail requesting orders for Mon. Through Fri. IV Fluids to continue tomorrow as orders have expired.   Tuesday's orders given to [facility] nursing have been received by pharmacy at this time.  No further needs or questions at this time.

## 2018-03-08 NOTE — Addendum Note (Signed)
Addended by: Kipp Laurence on: 03/08/2018 08:23 AM   Modules accepted: Orders

## 2018-03-15 ENCOUNTER — Ambulatory Visit: Payer: Self-pay | Admitting: Radiation Oncology

## 2018-03-19 ENCOUNTER — Encounter: Payer: BLUE CROSS/BLUE SHIELD | Admitting: Family Medicine

## 2018-03-20 DIAGNOSIS — E860 Dehydration: Secondary | ICD-10-CM | POA: Diagnosis not present

## 2018-03-20 DIAGNOSIS — C090 Malignant neoplasm of tonsillar fossa: Secondary | ICD-10-CM | POA: Diagnosis not present

## 2018-03-25 ENCOUNTER — Ambulatory Visit: Payer: 59 | Attending: Radiation Oncology | Admitting: Physical Therapy

## 2018-03-25 ENCOUNTER — Encounter: Payer: Self-pay | Admitting: Hematology

## 2018-03-25 DIAGNOSIS — M6281 Muscle weakness (generalized): Secondary | ICD-10-CM | POA: Diagnosis not present

## 2018-03-25 DIAGNOSIS — R29898 Other symptoms and signs involving the musculoskeletal system: Secondary | ICD-10-CM | POA: Diagnosis present

## 2018-03-25 NOTE — Patient Instructions (Signed)
Access Code: [email or web address]  URL: [email or web address]  Date: 03/25/2018  Prepared by: Lovett Calender   Exercises  Sit to Stand without Arm Support - 10 reps - 3 sets - 1x daily - 7x weekly  Shoulder Extension with Resistance - Palms Forward - 10 reps - 3 sets - 1x daily - 7x weekly  Standing Shoulder Row with Anchored Resistance - 10 reps - 3 sets - 1x daily - 7x weekly  Hooklying Small March - 10 reps - 3 sets - 1x daily - 7x weekly  Bent Knee Fallouts - 10 reps - 3 sets - 1x daily - 7x weekly  Bridge - 10 reps - 3 sets - 1x daily - 7x weekly  Standing Hip Abduction - 10 reps - 3 sets - 1x daily - 7x weekly  Standing Hip Extension - 10 reps - 3 sets - 1x daily - 7x weekly  Standing Hip Flexion - 10 reps - 3 sets - 1x daily - 7x weekly

## 2018-03-25 NOTE — Therapy (Signed)
[facility] [address], [place], [place], [address] Phone: [phone]   Fax:  [phone]  Physical Therapy Treatment  Patient Details  Name: Thomas Mccall MRN: [id number] Date of Birth: 01-Jun-1960 Referring Provider (PT): Dr. Eppie Gibson   Encounter Date: 03/25/2018  PT End of Session - 03/25/18 1619    Visit Number  2    Date for PT Re-Evaluation  04/19/18    PT Start Time  1615    PT Stop Time  1700    PT Time Calculation (min)  45 min    Activity Tolerance  Patient tolerated treatment well    Behavior During Therapy  [facility] for tasks assessed/performed       Past Medical History:  Diagnosis Date  . Headache   . Mass    right tonsil  . Wears glasses     Past Surgical History:  Procedure Laterality Date  . DIRECT LARYNGOSCOPY Right 11/12/2017   Procedure: DIRECT LARYNGOSCOPY;  Surgeon: Melissa Montane, MD;  Location: [facility];  Service: ENT;  Laterality: Right;  Direct laryngoscopy and biopy of right tonsil mass  . IR GASTROSTOMY TUBE MOD SED  12/26/2017  . IR IMAGING GUIDED PORT INSERTION  12/26/2017  . TONSILLECTOMY AND ADENOIDECTOMY  1970  . WISDOM TOOTH EXTRACTION      There were no vitals filed for this visit.  Subjective Assessment - 03/25/18 1619    Subjective  Pt states he lost 25lb.  He is now working 6 hours/day.  Pt states he hasn't been doing much exercise because he is not sure how much to do.    Pertinent History  Diagnosis is right tonsil squamous cell carcinoma, stage I (cT2, cN1, cM0, p16+). Had TORS consult with [facility] surgeon, but decided against that. He has 35 treatments of radiation and 2 of 3 chemo treatments     Currently in Pain?  No/denies                       [facility] Adult PT Treatment/Exercise - 03/25/18 0001      Exercises   Exercises  Shoulder;Knee/Hip      Knee/Hip Exercises: Aerobic   Nustep  L1 x 2 min; L2 x 3 min; L2 x 5 min at the end of session   PT  present for status     Knee/Hip Exercises: Standing   Heel Raises  20 reps    Hip Flexion  Stengthening;Right;Left;10 reps;Knee straight    Forward Step Up  Left;Right;10 reps;Hand Hold: 2      Knee/Hip Exercises: Seated   Long Arc Quad  Strengthening;Right;Left;2 sets;10 reps    Marching  Strengthening;Right;Left;Weights;20 reps    Marching Limitations  1.5 lb ankle; 1lb UE with alt UE/LE    Sit to Sand  2 sets;10 reps;without UE support   sitting on black pad; 1 set with horizontal abd grn band     Knee/Hip Exercises: Supine   Bridges  Strengthening;20 reps    Other Supine Knee/Hip Exercises  bend knee drop out, small march - 20x each    Other Supine Knee/Hip Exercises  hooklying diagonals UE green band with TrA engaged      Shoulder Exercises: Standing   Horizontal ABduction  Strengthening;Both;10 reps;Theraband    Theraband Level (Shoulder Horizontal ABduction)  Level 3 Nyoka Cowden)             PT Education - 03/25/18 1656    Education Details  Access Code: [email or web address]     Person(s) Educated  Patient    Methods  Explanation;Demonstration;Handout;Verbal cues    Comprehension  Verbalized understanding;Returned demonstration          PT Long Term Goals - 03/25/18 1706      PT LONG TERM GOAL #1   Title  Pt with increase the # of repetitions of sit to stand in 30 seconds to 13 indicating and impovement in general strength     Status  On-going      PT LONG TERM GOAL #2   Title  Pt will decrease the time to do a normal TUG to < 7.9 seconds indicating an improvment in functional mobilty to age matched norms    Status  On-going      PT LONG TERM GOAL #3   Title  Pt will be independent in a home exercise program for strength and endurance     Status  On-going            Plan - 03/25/18 1702    Clinical Impression Statement  Pt did well with exercises today.  He was educated in TrA contraction for improved posture and endurance.  He has not yet started any exercise  program and he feels fatigued even with limited activities.  Pt has not met any goals yet due to initial treatment since eval  He will continue to benefit from skilled PT to improve endurance    Clinical Impairments Affecting Rehab Potential  port in right upper chest and feeding tube     PT Treatment/Interventions  Patient/family education;ADLs/Self Care Home Management;Therapeutic activities;Therapeutic exercise;Neuromuscular re-education;Manual techniques    PT Next Visit Plan  f/u with initial HEP, progress general strength and balance    Consulted and Agree with Plan of Care  Patient       Patient will benefit from skilled therapeutic intervention in order to improve the following deficits and impairments:  Other (comment), Decreased mobility, Decreased knowledge of precautions, Decreased endurance, Decreased range of motion  Visit Diagnosis: Muscle weakness (generalized)  Other symptoms and signs involving the musculoskeletal system     Problem List Patient Active Problem List   Diagnosis Date Noted  . Tinnitus 02/06/2018  . Leukopenia due to antineoplastic chemotherapy ([place]) 01/30/2018  . Chemotherapy-induced thrombocytopenia 01/24/2018  . AKI (acute kidney injury) ([place]) 01/17/2018  . Chemotherapy-induced nausea 01/17/2018  . Dysgeusia 01/17/2018  . Malnutrition ([place]) 01/17/2018  . Mucositis due to chemotherapy 01/17/2018  . Port-A-Cath in place 01/09/2018  . Malignant neoplasm of tonsillar fossa ([place]) 11/30/2017    Zannie Cove, PT 03/25/2018, 5:13 PM  [facility] [address], [place], [place], [address] Phone: [phone]   Fax:  [phone]  Name: Thomas Mccall MRN: [id number] Date of Birth: [birth year]

## 2018-03-26 ENCOUNTER — Telehealth: Payer: Self-pay | Admitting: *Deleted

## 2018-03-26 ENCOUNTER — Encounter: Payer: Self-pay | Admitting: *Deleted

## 2018-03-26 ENCOUNTER — Inpatient Hospital Stay: Payer: 59 | Attending: Hematology

## 2018-03-26 ENCOUNTER — Inpatient Hospital Stay: Payer: 59

## 2018-03-26 ENCOUNTER — Encounter: Payer: Self-pay | Admitting: Hematology

## 2018-03-26 ENCOUNTER — Telehealth: Payer: Self-pay

## 2018-03-26 ENCOUNTER — Inpatient Hospital Stay (HOSPITAL_BASED_OUTPATIENT_CLINIC_OR_DEPARTMENT_OTHER): Payer: 59 | Admitting: Hematology

## 2018-03-26 VITALS — BP 118/73 | HR 78 | Temp 98.6°F | Resp 18 | Ht 70.0 in | Wt 153.8 lb

## 2018-03-26 DIAGNOSIS — T451X5A Adverse effect of antineoplastic and immunosuppressive drugs, initial encounter: Secondary | ICD-10-CM

## 2018-03-26 DIAGNOSIS — Z9221 Personal history of antineoplastic chemotherapy: Secondary | ICD-10-CM | POA: Diagnosis not present

## 2018-03-26 DIAGNOSIS — Z79899 Other long term (current) drug therapy: Secondary | ICD-10-CM | POA: Insufficient documentation

## 2018-03-26 DIAGNOSIS — E860 Dehydration: Secondary | ICD-10-CM | POA: Diagnosis not present

## 2018-03-26 DIAGNOSIS — D6481 Anemia due to antineoplastic chemotherapy: Secondary | ICD-10-CM | POA: Diagnosis not present

## 2018-03-26 DIAGNOSIS — Z931 Gastrostomy status: Secondary | ICD-10-CM

## 2018-03-26 DIAGNOSIS — T451X5S Adverse effect of antineoplastic and immunosuppressive drugs, sequela: Secondary | ICD-10-CM | POA: Insufficient documentation

## 2018-03-26 DIAGNOSIS — K1231 Oral mucositis (ulcerative) due to antineoplastic therapy: Secondary | ICD-10-CM

## 2018-03-26 DIAGNOSIS — Z95828 Presence of other vascular implants and grafts: Secondary | ICD-10-CM

## 2018-03-26 DIAGNOSIS — E46 Unspecified protein-calorie malnutrition: Secondary | ICD-10-CM

## 2018-03-26 DIAGNOSIS — H9313 Tinnitus, bilateral: Secondary | ICD-10-CM

## 2018-03-26 DIAGNOSIS — Z923 Personal history of irradiation: Secondary | ICD-10-CM

## 2018-03-26 DIAGNOSIS — D649 Anemia, unspecified: Secondary | ICD-10-CM | POA: Insufficient documentation

## 2018-03-26 DIAGNOSIS — C090 Malignant neoplasm of tonsillar fossa: Secondary | ICD-10-CM | POA: Insufficient documentation

## 2018-03-26 DIAGNOSIS — C770 Secondary and unspecified malignant neoplasm of lymph nodes of head, face and neck: Secondary | ICD-10-CM | POA: Insufficient documentation

## 2018-03-26 DIAGNOSIS — Z452 Encounter for adjustment and management of vascular access device: Secondary | ICD-10-CM | POA: Insufficient documentation

## 2018-03-26 LAB — CMP (CANCER CENTER ONLY)
ALT: 15 U/L (ref 0–44)
AST: 13 U/L — ABNORMAL LOW (ref 15–41)
Albumin: 4.1 g/dL (ref 3.5–5.0)
Alkaline Phosphatase: 64 U/L (ref 38–126)
Anion gap: 10 (ref 5–15)
BUN: 15 mg/dL (ref 6–20)
CO2: 26 mmol/L (ref 22–32)
Calcium: 9.7 mg/dL (ref 8.9–10.3)
Chloride: 104 mmol/L (ref 98–111)
Creatinine: 0.83 mg/dL (ref 0.61–1.24)
GFR, Est AFR Am: >60 mL/min (ref >60)
GFR, Estimated: >60 mL/min (ref >60)
Glucose, Bld: 95 mg/dL (ref 70–99)
Potassium: 3.9 mmol/L (ref 3.5–5.1)
Sodium: 140 mmol/L (ref 135–145)
Total Bilirubin: 0.5 mg/dL (ref 0.3–1.2)
Total Protein: 6.9 g/dL (ref 6.5–8.1)

## 2018-03-26 LAB — CBC WITH DIFFERENTIAL (CANCER CENTER ONLY)
Abs Immature Granulocytes: 0.02 10*3/uL (ref 0.00–0.07)
Basophils Absolute: 0 10*3/uL (ref 0.0–0.1)
Basophils Relative: 0 %
Eosinophils Absolute: 0 10*3/uL (ref 0.0–0.5)
Eosinophils Relative: 1 %
HCT: 33.5 % — ABNORMAL LOW (ref 39.0–52.0)
Hemoglobin: 11.2 g/dL — ABNORMAL LOW (ref 13.0–17.0)
Immature Granulocytes: 0 %
Lymphocytes Relative: 5 %
Lymphs Abs: 0.3 10*3/uL — ABNORMAL LOW (ref 0.7–4.0)
MCH: 29.8 pg (ref 26.0–34.0)
MCHC: 33.4 g/dL (ref 30.0–36.0)
MCV: 89.1 fL (ref 80.0–100.0)
Monocytes Absolute: 0.5 10*3/uL (ref 0.1–1.0)
Monocytes Relative: 9 %
Neutro Abs: 4.3 10*3/uL (ref 1.7–7.7)
Neutrophils Relative %: 85 %
Platelet Count: 157 10*3/uL (ref 150–400)
RBC: 3.76 MIL/uL — ABNORMAL LOW (ref 4.22–5.81)
RDW: 14.2 % (ref 11.5–15.5)
WBC Count: 5.1 10*3/uL (ref 4.0–10.5)
nRBC: 0 % (ref 0.0–0.2)

## 2018-03-26 LAB — MAGNESIUM: Magnesium: 2 mg/dL (ref 1.7–2.4)

## 2018-03-26 MED ORDER — HEPARIN SOD (PORK) LOCK FLUSH 100 UNIT/ML IV SOLN
500.0000 [IU] | Freq: Once | INTRAVENOUS | Status: AC | PRN
  Administered 2018-03-26: 500 [IU]
  Filled 2018-03-26: qty 5

## 2018-03-26 MED ORDER — SODIUM CHLORIDE 0.9% FLUSH
10.0000 mL | INTRAVENOUS | Status: DC | PRN
  Administered 2018-03-26: 10 mL
  Filled 2018-03-26: qty 10

## 2018-03-26 NOTE — Telephone Encounter (Signed)
Spoke with Verdell Face, RN [facility] and gave her verbal order re:  Per Dr. Maylon Peppers,  Stop home IVF after port deaccessed this Friday 03/29/2018.  Sharyn Lull voiced understanding. Michelle's   Phone    [phone].

## 2018-03-26 NOTE — Telephone Encounter (Signed)
Printed avs and calender of upcoming appointment. Per 1/7 los 

## 2018-03-26 NOTE — Progress Notes (Signed)
[facility] OFFICE PROGRESS NOTE  Patient Care Team: Martinique, Betty G, MD as PCP - General (Family Medicine) Eppie Gibson, MD as Attending Physician (Radiation Oncology) Tish Men, MD as Consulting Physician (Hematology) Leota Sauers, RN as Oncology Nurse Navigator (Oncology) Karie Mainland, RD as Dietitian (Nutrition) Sharen Counter, CCC-SLP as Speech Language Pathologist (Speech Pathology) Wynelle Beckmann, Melodie Bouillon, PT as Physical Therapist (Physical Therapy)  HEME/ONC OVERVIEW: 1. 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, p16+ -For detailed oncologic hx, see below -Completed chemoradiation in the end of 01/2018 -Received 2 doses of cisplatin; 2nd dose reduced due to Grade 3 neutropenia; 3rd dose omitted due to Grade 3 bilateral tinnitus  -Patient reports improving oral pain and slight increase in oral intake since completing chemoradiation -Post-treatment PET tentatively ordered for early 05/2018 -I encouraged the patient to adhere to jaw and shoulder exercises, and to increase PO intake as tolerated  Chemotherapy-induced tinnitus -Improving -We will continue to monitor it for now; if it worsens, we will refer the patient to audiology for further evaluation  Treatment-related mucositis -Patient reports mild intermittent pain with swallowing, overall improving -Continue PRN morphine   Chemotherapy-induced anemia -Hgb 11.2 today, improving -We will continue to monitor it for now   Protein malnutrition, weight loss -The patient  is currently taking approximately 4 cans of Osmolite per day -Home IV fluid initiated due to dehydration, 1-2L/day, M-F -Given the improvement in his oral intake, we will plan to discontinue home IV fluid at the end of the week -I encouraged the patient to continue increasing his oral intake as tolerated   Port access -Q6-8week flush for now, pending PET results  All questions were answered. The patient knows to call the clinic with any problems, questions or concerns. No barriers to learning was detected.  A total of more than 25 minutes were spent face-to-face with the patient during this encounter and over half of that time was spent on counseling and coordination of care as outlined above.  Return in the end of 03/2018 for labs, monitoring of oral intake, and clinic follow-up.   Tish Men, MD 03/26/2018 9:11 PM  CHIEF COMPLAINT: "I am eating a little better"  INTERVAL HISTORY: Thomas Mccall returns to clinic for follow-up after completing chemoradiation in the end of 01/2018.  Patient reports that since last visit, he has been able to increase his oral intake, and is able to eat mash potatoes and chicken noodle soup. He had some difficulty with eating grilled cheese due to decreased saliva. He also reports mild throat soreness with eating. He is taking ~3-4 cans of Osmolite per day via PEG tube. He is still receiving home IV fluid, 1-2L per day. He has gone back to work, and is working 5-6 hours per day. He denies any other complaint today.   SUMMARY OF ONCOLOGIC HISTORY:   Malignant neoplasm of tonsillar fossa ([place])   10/30/2017 Miscellaneous    Presented to Dr. Redmond Baseman with 3 months of R neck mass    11/07/2017 Imaging  CT neck w/ contrast:  1. Right tonsillar mass measures up to 2.6 cm, highly concerning for a squamous cell carcinoma. 2. Enlarged homogeneous right level 2 lymph node measures up to 3.9 cm, consistent with metastatic disease. 3. Adjacent right level 3 lymph node  measures 1.1 cm. 4. Lucency at C4 on the left is likely degenerative. Metastatic disease is not excluded. Please correlate with findings on PET scan if used for further evaluation.    11/12/2017 Procedure    R tonsillar biopsy by Dr. Janace Hoard    11/12/2017 Pathology Results    Accession: [id number] Invasive squamous cell carcinoma; p16 diffusely positive    11/28/2017 Imaging    PET scan: 1. Right tonsillar fossa mass is intensely hypermetabolic compatible with primary neoplasm. 2. Solitary enlarged and hypermetabolic right level 2 cervical lymph node compatible with metastatic adenopathy. 3. No evidence for FDG avid hypermetabolic distant metastasis.    11/30/2017 Cancer Staging    Staging form: Pharynx - HPV-Mediated Oropharynx, AJCC 8th Edition - Clinical stage from 11/30/2017: Stage I (cT2, cN1, cM0, p16+) - Signed by Eppie Gibson, MD on 11/30/2017    12/07/2017 Miscellaneous    ENT evaluation to [facility] recommended against surgical resection due to the tumor extending into the right inferior nasopharynx and posterior soft palate, which would require resection of almost 1/2 of the soft palate and the right nasopharyngeal wall extending into right posterior/lateral posterior wall.     Procedure    12/26/2017: port and PEG tube placement     12/12/2017 -  Chemotherapy    The patient had palonosetron (ALOXI) injection 0.25 mg, 0.25 mg, Intravenous,  Once, 2 of 3 cycles Administration: 0.25 mg (12/28/2017), 0.25 mg (01/25/2018) CISplatin (PLATINOL) 196 mg in sodium chloride 0.9 % 500 mL chemo infusion, 100 mg/m2 = 196 mg, Intravenous,  Once, 2 of 3 cycles Dose modification: 80 mg/m2 (original dose 100 mg/m2, Cycle 2, Reason: Dose not tolerated, Comment: Neutropenia) Administration: 196 mg (12/28/2017), 157 mg (01/25/2018) fosaprepitant (EMEND) 150 mg, dexamethasone (DECADRON) 12 mg in sodium chloride 0.9 % 145 mL IVPB, , Intravenous,  Once, 2 of 3 cycles Administration:  (12/28/2017),   (01/25/2018)  for chemotherapy treatment.      REVIEW OF SYSTEMS:   Constitutional: ( - ) fevers, ( - )  chills , ( - ) night sweats Eyes: ( - ) blurriness of vision, ( - ) double vision, ( - ) watery eyes Ears, nose, mouth, throat, and face: ( + ) mucositis, ( + ) sore throat Respiratory: ( - ) cough, ( - ) dyspnea, ( - ) wheezes Cardiovascular: ( - ) palpitation, ( - ) chest discomfort, ( - ) lower extremity swelling Gastrointestinal:  ( - ) nausea, ( - ) heartburn, ( - ) change in bowel habits Skin: ( - ) abnormal skin rashes Lymphatics: ( - ) new lymphadenopathy, ( - ) easy bruising Neurological: ( - ) numbness, ( - ) tingling, ( - ) new weaknesses, ( + ) tinnitus  Behavioral/Psych: ( - ) mood change, ( - ) new changes  All other systems were reviewed with the patient and are negative.  I have reviewed the past medical history, past surgical history, social history and family history with the patient and they are unchanged from previous note.  ALLERGIES:  has No Known Allergies.  MEDICATIONS:  Current Outpatient Medications  Medication Sig Dispense Refill  . lidocaine-prilocaine (EMLA) cream Apply to affected area once 30 g 3  . Nutritional Supplements (  FEEDING SUPPLEMENT, OSMOLITE 1.5 CAL,) LIQD Begin 1.5 bottles Osmolite 1.5 via PEG QID with 60 mL free water before and after bolus feedings. Mix 30 mL Promod with 60 mL water BID and put into PEG. Flush with 30 mL water. Do not mix Promod with Osmolite 1.5 6 Bottle 0  . lidocaine (XYLOCAINE) 2 % solution Patient: Mix 1part 2% viscous lidocaine, 1part H20. Swish & swallow 78mL of diluted mixture, 31min before meals and at bedtime, up to QID (Patient not taking: Reported on 03/01/2018) 100 mL 5  . Morphine Sulfate (MORPHINE CONCENTRATE) 10 mg / 0.5 ml concentrated solution Place 10 mg into feeding tube every 3 (three) hours as needed for moderate pain or severe pain.    Marland Kitchen ondansetron (ZOFRAN) 8 MG tablet Take 8 mg by mouth every 8  (eight) hours as needed for nausea or vomiting.    . prochlorperazine (COMPAZINE) 10 MG tablet Take 1 tablet (10 mg total) by mouth every 6 (six) hours as needed (Nausea or vomiting). (Patient not taking: Reported on 03/06/2018) 30 tablet 1   No current facility-administered medications for this visit.     PHYSICAL EXAMINATION: ECOG PERFORMANCE STATUS: 1 - Symptomatic but completely ambulatory  Today's Vitals   03/26/18 1534 03/26/18 1545  BP: 118/73   Pulse: 78   Resp: 18   Temp: 98.6 F (37 C)   TempSrc: Oral   SpO2: 100%   Weight: 153 lb 12.8 oz (69.8 kg)   Height: 5\' 10"  (1.778 m)   PainSc:  0-No pain   Body mass index is 22.07 kg/m.  Filed Weights   03/26/18 1534  Weight: 153 lb 12.8 oz (69.8 kg)    GENERAL: alert, no distress, comfortable SKIN: erythema over the neck area has resolved  EYES: conjunctiva are pink and non-injected, sclera clear OROPHARYNX: small patches of white plaques over the tongue, improving NECK: supple, mild tenderness with palpation in the right neck LYMPH:  improving right cervical adenopathy, ~1cm, no other palpable lymphadenopathy LUNGS: clear to auscultation and percussion with normal breathing effort HEART: regular rate & rhythm and no murmurs and no lower extremity edema ABDOMEN: soft, non-tender, non-distended, normal bowel sounds Musculoskeletal: no cyanosis of digits and no clubbing  PSYCH: alert, slightly mumbled but improving speech NEURO: no focal motor/sensory deficits  LABORATORY DATA:  I have reviewed the data as listed    Component Value Date/Time   NA 140 03/26/2018 1456   K 3.9 03/26/2018 1456   CL 104 03/26/2018 1456   CO2 26 03/26/2018 1456   GLUCOSE 95 03/26/2018 1456   BUN 15 03/26/2018 1456   CREATININE 0.83 03/26/2018 1456   CALCIUM 9.7 03/26/2018 1456   PROT 6.9 03/26/2018 1456   ALBUMIN 4.1 03/26/2018 1456   AST 13 (L) 03/26/2018 1456   ALT 15 03/26/2018 1456   ALKPHOS 64 03/26/2018 1456   BILITOT 0.5  03/26/2018 1456   GFRNONAA >60 03/26/2018 1456   GFRAA >60 03/26/2018 1456    No results found for: SPEP, UPEP  Lab Results  Component Value Date   WBC 5.1 03/26/2018   NEUTROABS 4.3 03/26/2018   HGB 11.2 (L) 03/26/2018   HCT 33.5 (L) 03/26/2018   MCV 89.1 03/26/2018   PLT 157 03/26/2018      Chemistry      Component Value Date/Time   NA 140 03/26/2018 1456   K 3.9 03/26/2018 1456   CL 104 03/26/2018 1456   CO2 26 03/26/2018 1456   BUN 15 03/26/2018  1456   CREATININE 0.83 03/26/2018 1456      Component Value Date/Time   CALCIUM 9.7 03/26/2018 1456   ALKPHOS 64 03/26/2018 1456   AST 13 (L) 03/26/2018 1456   ALT 15 03/26/2018 1456   BILITOT 0.5 03/26/2018 1456

## 2018-03-27 ENCOUNTER — Encounter: Payer: Self-pay | Admitting: Physical Therapy

## 2018-03-27 ENCOUNTER — Ambulatory Visit: Payer: 59 | Admitting: Physical Therapy

## 2018-03-27 DIAGNOSIS — M6281 Muscle weakness (generalized): Secondary | ICD-10-CM

## 2018-03-27 DIAGNOSIS — R29898 Other symptoms and signs involving the musculoskeletal system: Secondary | ICD-10-CM | POA: Diagnosis not present

## 2018-03-27 NOTE — Therapy (Signed)
[facility] [address] [place], [place], [address] Phone: [phone]   Fax:  [phone]  Physical Therapy Treatment  Patient Details  Name: Thomas Mccall MRN: [id number] Date of Birth: 1960-06-10 Referring Provider (PT): Dr. Eppie Gibson   Encounter Date: 03/27/2018  PT End of Session - 03/27/18 1622    Visit Number  3    Number of Visits  13    Date for PT Re-Evaluation  04/19/18    PT Start Time  1618    PT Stop Time  1653    PT Time Calculation (min)  35 min    Activity Tolerance  Patient limited by fatigue;Patient tolerated treatment well    Behavior During Therapy  [facility] for tasks assessed/performed       Past Medical History:  Diagnosis Date  . Headache   . Mass    right tonsil  . Wears glasses     Past Surgical History:  Procedure Laterality Date  . DIRECT LARYNGOSCOPY Right 11/12/2017   Procedure: DIRECT LARYNGOSCOPY;  Surgeon: Melissa Montane, MD;  Location: [facility];  Service: ENT;  Laterality: Right;  Direct laryngoscopy and biopy of right tonsil mass  . IR GASTROSTOMY TUBE MOD SED  12/26/2017  . IR IMAGING GUIDED PORT INSERTION  12/26/2017  . TONSILLECTOMY AND ADENOIDECTOMY  1970  . WISDOM TOOTH EXTRACTION      There were no vitals filed for this visit.  Subjective Assessment - 03/27/18 1622    Subjective  Pretty tired today.    Pertinent History  Diagnosis is right tonsil squamous cell carcinoma, stage I (cT2, cN1, cM0, p16+). Had TORS consult with [facility] surgeon, but decided against that. He has 35 treatments of radiation and 2 of 3 chemo treatments     Currently in Pain?  No/denies   Just fatigued   Multiple Pain Sites  No                       [facility] Adult PT Treatment/Exercise - 03/27/18 0001      Knee/Hip Exercises: Aerobic   Nustep  L2 x 7 min   PTA present to monitor pt's fatigue     Knee/Hip Exercises: Standing   Heel Raises  20 reps   VC to contract quads/knees  wobbly   Hip Flexion  Stengthening;Both;1 set;20 reps;Knee bent   2#     Knee/Hip Exercises: Seated   Long Arc Quad  Strengthening;Both;2 sets;10 reps;Weights    Long Arc Quad Weight  2 lbs.      Knee/Hip Exercises: Supine   Bridges with Clamshell  Strengthening;Both;1 set;10 reps    Straight Leg Raises  Strengthening;Both;1 set;10 reps   Slight knee lag, VC to contract core   Other Supine Knee/Hip Exercises  red band clamshells 15x with TA conraction      Knee/Hip Exercises: Sidelying   Clams  red band bil 10x    VC for core contraction     Shoulder Exercises: Seated   Horizontal ABduction  Strengthening;Both;15 reps;Theraband    Theraband Level (Shoulder Horizontal ABduction)  Level 3 (Green)    Diagonals  Strengthening;Both;15 reps;Theraband    Theraband Level (Shoulder Diagonals)  Level 3 (Green)                  PT Long Term Goals - 03/25/18 1706      PT LONG TERM GOAL #1   Title  Pt with increase the # of repetitions  of sit to stand in 30 seconds to 13 indicating and impovement in general strength     Status  On-going      PT LONG TERM GOAL #2   Title  Pt will decrease the time to do a normal TUG to < 7.9 seconds indicating an improvment in functional mobilty to age matched norms    Status  On-going      PT LONG TERM GOAL #3   Title  Pt will be independent in a home exercise program for strength and endurance     Status  On-going            Plan - 03/27/18 1622    Clinical Impression Statement  Pt presents fatigued prior to starting session today. He did tolerate failrly well the addition of light resistance for LE strength and endurance with a reasonable rest break  in between exercises/sets.     Rehab Potential  Excellent    Clinical Impairments Affecting Rehab Potential  port in right upper chest and feeding tube     PT Frequency  One time visit    PT Treatment/Interventions  Patient/family education;ADLs/Self Care Home Management;Therapeutic  activities;Therapeutic exercise;Neuromuscular re-education;Manual techniques    PT Home Exercise Plan  neck ROM, walking    Consulted and Agree with Plan of Care  Patient       Patient will benefit from skilled therapeutic intervention in order to improve the following deficits and impairments:  Other (comment), Decreased mobility, Decreased knowledge of precautions, Decreased endurance, Decreased range of motion  Visit Diagnosis: Muscle weakness (generalized)  Other symptoms and signs involving the musculoskeletal system     Problem List Patient Active Problem List   Diagnosis Date Noted  . Anemia due to antineoplastic chemotherapy 03/26/2018  . Tinnitus 02/06/2018  . Leukopenia due to antineoplastic chemotherapy ([place]) 01/30/2018  . Chemotherapy-induced thrombocytopenia 01/24/2018  . AKI (acute kidney injury) ([place]) 01/17/2018  . Chemotherapy-induced nausea 01/17/2018  . Dysgeusia 01/17/2018  . Malnutrition ([place]) 01/17/2018  . Mucositis due to chemotherapy 01/17/2018  . Port-A-Cath in place 01/09/2018  . Malignant neoplasm of tonsillar fossa ([place]) 11/30/2017    Ethelyn Cerniglia, PTA 03/27/2018, 4:54 PM  [facility] [address], [place], [place], [address] Phone: [phone]   Fax:  [phone]  Name: Thomas Mccall MRN: [id number] Date of Birth: 1961/01/15

## 2018-03-29 ENCOUNTER — Encounter (HOSPITAL_COMMUNITY): Payer: Self-pay | Admitting: Dentistry

## 2018-03-29 ENCOUNTER — Ambulatory Visit (HOSPITAL_COMMUNITY): Payer: Self-pay | Admitting: Dentistry

## 2018-03-29 VITALS — BP 116/71 | HR 90 | Temp 98.1°F | Wt 153.8 lb

## 2018-03-29 DIAGNOSIS — R682 Dry mouth, unspecified: Secondary | ICD-10-CM

## 2018-03-29 DIAGNOSIS — Z9221 Personal history of antineoplastic chemotherapy: Secondary | ICD-10-CM

## 2018-03-29 DIAGNOSIS — R432 Parageusia: Secondary | ICD-10-CM

## 2018-03-29 DIAGNOSIS — Z923 Personal history of irradiation: Secondary | ICD-10-CM

## 2018-03-29 DIAGNOSIS — J392 Other diseases of pharynx: Secondary | ICD-10-CM

## 2018-03-29 DIAGNOSIS — R252 Cramp and spasm: Secondary | ICD-10-CM

## 2018-03-29 DIAGNOSIS — K117 Disturbances of salivary secretion: Secondary | ICD-10-CM

## 2018-03-29 DIAGNOSIS — C090 Malignant neoplasm of tonsillar fossa: Secondary | ICD-10-CM

## 2018-03-29 NOTE — Patient Instructions (Addendum)
RECOMMENDATIONS: 1. Ideally patient needs to brush after meals and at bedtime.  Use fluoride at bedtime. Brush tongue daily. 2. Use trismus exercises as directed. 3. Use Biotene Rinse or salt water/baking soda rinses. 4. Multiple sips of water as needed. 5. Return to Dr. Jetty Duhamel in early March for exam and cleaning and discussion of replacement of missing tooth #14.   Lenn Cal, DDS

## 2018-03-29 NOTE — Progress Notes (Signed)
03/29/2018  Patient Name:   Thomas Mccall Date of Birth:   1960-08-18 Medical Record Number: [id number]  BP 116/71 (BP Location: Left Arm)   Pulse 90   Temp 98.1 F (36.7 C)   Wt 153 lb 12.8 oz (69.8 kg)   BMI 22.07 kg/m   [name] presents for oral examination after chemoradiation therapy. Patient has completed all radiation treatments from 12/27/2017 through 02/13/2018. Patient received 2 doses of chemotherapy.  REVIEW OF CHIEF COMPLAINTS:  DRY MOUTH: Yes HARD TO SWALLOW:   no HURT TO SWALLOW: no TASTE CHANGES: Taste is returning slowly SORES IN MOUTH: Denies   TRISMUS: Some tightness. Is doing exercises daily WEIGHT: 154 down from 172 lbs initially  HOME OH REGIMEN:  BRUSHING: once a day FLOSSING: once a day RINSING: the patient is not using any rinses secondary to severe gag reflex. FLUORIDE:  Patient has been unable to use the PreviDent 5000 toothpaste due to gag reflex and "throwing up". The patient was encouraged to start using fluoride daily. TRISMUS EXERCISES:  Maximum interincisal opening: 30 mm down from initial 40 mm. Patient is encouraged to use trismus exercises twice a day.   DENTAL EXAM:  Oral Hygiene:(PLAQUE): plaque accumulations noted. Oral hygiene improvement was highly suggested. It was also suggested that he brush after meals and at bedtime. LOCATION OF MUCOSITIS:  None noted DESCRIPTION OF SALIVA: Decreased saliva. Moderate xerostomia. ANY EXPOSED BONE: none noted OTHER WATCHED AREAS:  Previous extraction sites 2/31 extractions done by Dr. Olena Heckle. DX: Xerostomia, Dysgeusia, Trimus and missing teeth.  RECOMMENDATIONS: 1. Ideally patient needs to brush after meals and at bedtime.  Use fluoride at bedtime. Brush tongue daily. 2. Use trismus exercises as directed. 3. Use Biotene Rinse or salt water/baking soda rinses. 4. Multiple sips of water as needed. 5. Return to Dr. Jetty Duhamel in early March for exam and cleaning and discussion  of replacement of missing tooth #14.   Lenn Cal, DDS

## 2018-04-01 ENCOUNTER — Encounter: Payer: Self-pay | Admitting: Physical Therapy

## 2018-04-01 NOTE — Progress Notes (Signed)
A user error has taken place: charting done on wrong patient and has been corrected.

## 2018-04-03 ENCOUNTER — Encounter: Payer: Self-pay | Admitting: Physical Therapy

## 2018-04-03 ENCOUNTER — Ambulatory Visit: Payer: 59 | Admitting: Physical Therapy

## 2018-04-03 DIAGNOSIS — R29898 Other symptoms and signs involving the musculoskeletal system: Secondary | ICD-10-CM

## 2018-04-03 DIAGNOSIS — M6281 Muscle weakness (generalized): Secondary | ICD-10-CM | POA: Diagnosis not present

## 2018-04-03 NOTE — Therapy (Signed)
[facility] [address] [place], [place], [address] Phone: [phone]   Fax:  [phone]  Physical Therapy Treatment  Patient Details  Name: Thomas Mccall MRN: [id number] Date of Birth: 23-Jan-1961 Referring Provider (PT): Dr. Eppie Gibson   Encounter Date: 04/03/2018  PT End of Session - 04/03/18 1606    Visit Number  4    Number of Visits  13    Date for PT Re-Evaluation  04/19/18    PT Start Time  9169    PT Stop Time  1645    PT Time Calculation (min)  39 min    Activity Tolerance  Patient limited by fatigue    Behavior During Therapy  [facility] for tasks assessed/performed       Past Medical History:  Diagnosis Date  . Headache   . Mass    right tonsil  . Wears glasses     Past Surgical History:  Procedure Laterality Date  . DIRECT LARYNGOSCOPY Right 11/12/2017   Procedure: DIRECT LARYNGOSCOPY;  Surgeon: Melissa Montane, MD;  Location: [place];  Service: ENT;  Laterality: Right;  Direct laryngoscopy and biopy of right tonsil mass  . IR GASTROSTOMY TUBE MOD SED  12/26/2017  . IR IMAGING GUIDED PORT INSERTION  12/26/2017  . TONSILLECTOMY AND ADENOIDECTOMY  1970  . WISDOM TOOTH EXTRACTION      There were no vitals filed for this visit.  Subjective Assessment - 04/03/18 1613    Subjective  I over did it last week big time. I pushed myself beyond where I should have and experienced total, max fatigue.     Pertinent History  Diagnosis is right tonsil squamous cell carcinoma, stage I (cT2, cN1, cM0, p16+). Had TORS consult with [facility] surgeon, but decided against that. He has 35 treatments of radiation and 2 of 3 chemo treatments     Currently in Pain?  No/denies                       [facility] Adult PT Treatment/Exercise - 04/03/18 0001      Knee/Hip Exercises: Aerobic   Nustep  L2 x 8 min   PTA present to monitor fatigue levels/ discuss status     Knee/Hip Exercises: Seated   Long Arc Quad   Strengthening;Both;1 set;10 reps;Weights    Long Arc Quad Weight  2 lbs.    Long [employer] Limitations  Reduced to 1 set due to pt's fatigue levels and reports of overdoing    Clamshell with TheraBand  Red   10X   Marching  Strengthening;Both;1 set;10 reps;Weights    Marching Limitations  2#    Hamstring Limitations  Ankle rocker board 20x slowly for PF/DF      Knee/Hip Exercises: Supine   Short Arc Quad Sets  --   6x bil 0# 3 sec hold   Bridges  Strengthening;Both;2 sets;5 reps    Straight Leg Raises  Strengthening;Both;2 sets;5 reps   Slight knee lag, VC to contract core   Straight Leg Raises Limitations  VC to engage core and keep leg straight      Shoulder Exercises: Seated   Horizontal ABduction  Strengthening;Both;10 reps;Theraband    Theraband Level (Shoulder Horizontal ABduction)  Level 3 (Green)    Diagonals  Strengthening;Both;10 reps;Theraband    Theraband Level (Shoulder Diagonals)  Level 3 (Green)    Diagonals Limitations  Reduced reps slightly due to fatigue level  PT Long Term Goals - 03/25/18 1706      PT LONG TERM GOAL #1   Title  Pt with increase the # of repetitions of sit to stand in 30 seconds to 13 indicating and impovement in general strength     Status  On-going      PT LONG TERM GOAL #2   Title  Pt will decrease the time to do a normal TUG to < 7.9 seconds indicating an improvment in functional mobilty to age matched norms    Status  On-going      PT LONG TERM GOAL #3   Title  Pt will be independent in a home exercise program for strength and endurance     Status  On-going            Plan - 04/03/18 1645    Clinical Impression Statement  Pt arrives today more fatigued than last session. He reports that last week he started to feel better so he pushed himself more, stayed up a little later, etc but only to feel what he describes as max, total fatigue as a result. We backed all exercises down in resp/ and or sets. By  doing this he was able to tolerate his treatment. Fatigue noted at end of session ( more than at beginining of session.)     Rehab Potential  Excellent    Clinical Impairments Affecting Rehab Potential  port in right upper chest and feeding tube     PT Frequency  One time visit    PT Treatment/Interventions  Patient/family education;ADLs/Self Care Home Management;Therapeutic activities;Therapeutic exercise;Neuromuscular re-education;Manual techniques    PT Next Visit Plan  f/u with initial HEP, progress general strength and balance    PT Home Exercise Plan  neck ROM, walking    Consulted and Agree with Plan of Care  Patient       Patient will benefit from skilled therapeutic intervention in order to improve the following deficits and impairments:  Other (comment), Decreased mobility, Decreased knowledge of precautions, Decreased endurance, Decreased range of motion  Visit Diagnosis: Muscle weakness (generalized)  Other symptoms and signs involving the musculoskeletal system     Problem List Patient Active Problem List   Diagnosis Date Noted  . Anemia due to antineoplastic chemotherapy 03/26/2018  . Tinnitus 02/06/2018  . Leukopenia due to antineoplastic chemotherapy ([place]) 01/30/2018  . Chemotherapy-induced thrombocytopenia 01/24/2018  . AKI (acute kidney injury) ([place]) 01/17/2018  . Chemotherapy-induced nausea 01/17/2018  . Dysgeusia 01/17/2018  . Malnutrition ([place]) 01/17/2018  . Mucositis due to chemotherapy 01/17/2018  . Port-A-Cath in place 01/09/2018  . Malignant neoplasm of tonsillar fossa ([place]) 11/30/2017    ,, PTA 04/03/2018, 4:52 PM  [facility] [address], [place], [place], [address] Phone: [phone]   Fax:  [phone]  Name: Thomas Mccall MRN: [id number] Date of Birth: 1960/09/18

## 2018-04-05 DIAGNOSIS — C4492 Squamous cell carcinoma of skin, unspecified: Secondary | ICD-10-CM | POA: Diagnosis not present

## 2018-04-08 ENCOUNTER — Ambulatory Visit: Payer: 59 | Admitting: Physical Therapy

## 2018-04-08 DIAGNOSIS — R29898 Other symptoms and signs involving the musculoskeletal system: Secondary | ICD-10-CM

## 2018-04-08 DIAGNOSIS — M6281 Muscle weakness (generalized): Secondary | ICD-10-CM

## 2018-04-08 NOTE — Therapy (Signed)
[facility] [address], [place], [place], [address] Phone: [phone]   Fax:  [phone]  Physical Therapy Treatment  Patient Details  Name: Thomas Mccall MRN: [id number] Date of Birth: 15-May-1960 Referring Provider (PT): Dr. Eppie Gibson   Encounter Date: 04/08/2018  PT End of Session - 04/08/18 1615    Visit Number  5    Date for PT Re-Evaluation  04/19/18    PT Start Time  1616    PT Stop Time  1655    PT Time Calculation (min)  39 min    Activity Tolerance  Patient limited by fatigue    Behavior During Therapy  [facility] for tasks assessed/performed       Past Medical History:  Diagnosis Date  . Headache   . Mass    right tonsil  . Wears glasses     Past Surgical History:  Procedure Laterality Date  . DIRECT LARYNGOSCOPY Right 11/12/2017   Procedure: DIRECT LARYNGOSCOPY;  Surgeon: Melissa Montane, MD;  Location: [place];  Service: ENT;  Laterality: Right;  Direct laryngoscopy and biopy of right tonsil mass  . IR GASTROSTOMY TUBE MOD SED  12/26/2017  . IR IMAGING GUIDED PORT INSERTION  12/26/2017  . TONSILLECTOMY AND ADENOIDECTOMY  1970  . WISDOM TOOTH EXTRACTION      There were no vitals filed for this visit.  Subjective Assessment - 04/08/18 1618    Subjective  I took easier last week because I was so tired from the week before.  Pt denies any increased fatigue after previous session.    Pertinent History  Diagnosis is right tonsil squamous cell carcinoma, stage I (cT2, cN1, cM0, p16+). Had TORS consult with [facility] surgeon, but decided against that. He has 35 treatments of radiation and 2 of 3 chemo treatments     Currently in Pain?  No/denies                       [facility] Adult PT Treatment/Exercise - 04/08/18 0001      Knee/Hip Exercises: Stretches   Active Hamstring Stretch  Right;Left;2 reps;20 seconds    Other Knee/Hip Stretches  piriformis 2 x 30 sec bilat      Knee/Hip Exercises:  Aerobic   Nustep  L2 x 8 min   PT present to monitor fatigue levels/ discuss status     Knee/Hip Exercises: Seated   Long Arc Quad  Strengthening;Both;1 set;10 reps;Weights    Long Arc Quad Weight  2 lbs.    Clamshell with TheraBand  Red   10X   Marching  Strengthening;Both;1 set;10 reps;Limitations    Marching Limitations  red band    Hamstring Limitations  ankle circles on round rocker 10 x each way      Knee/Hip Exercises: Supine   Short Arc Quad Sets  Strengthening;Right;Left;10 reps   10x 2 lb   Constance Haw  Strengthening;Both;2 sets;5 reps      Shoulder Exercises: Seated   Extension  Strengthening;Both;10 reps;Theraband    Theraband Level (Shoulder Extension)  Level 3 (Green)    Row  Strengthening;Both;10 reps;Theraband    Theraband Level (Shoulder Row)  Level 3 (Green)      Shoulder Exercises: Standing   Extension  Strengthening;Both;10 reps;Theraband    Theraband Level (Shoulder Extension)  Level 3 (Green)    Diagonals  Strengthening;Right;Left;10 reps                  PT Long Term  Goals - 04/08/18 1652      PT LONG TERM GOAL #1   Title  Pt with increase the # of repetitions of sit to stand in 30 seconds to 13 indicating and impovement in general strength     Baseline  still has a lot of fatigue    Status  On-going      PT LONG TERM GOAL #2   Title  Pt will decrease the time to do a normal TUG to < 7.9 seconds indicating an improvment in functional mobilty to age matched norms    Status  On-going      PT LONG TERM GOAL #3   Title  Pt will be independent in a home exercise program for strength and endurance     Status  On-going            Plan - 04/08/18 1650    Clinical Impression Statement  Pt reported feeling okay after previous session.  Pt was able to add resistance to SAQ exercise.  He tolerated full 39 minutes of treatment.  Pt will continue to benefit from skilled PT to gradually increase the level of exercises for return to maximum  function.    PT Treatment/Interventions  Patient/family education;ADLs/Self Care Home Management;Therapeutic activities;Therapeutic exercise;Neuromuscular re-education;Manual techniques    PT Next Visit Plan  f/u with fatigue levels, progress general strength and balance    Consulted and Agree with Plan of Care  Patient       Patient will benefit from skilled therapeutic intervention in order to improve the following deficits and impairments:  Other (comment), Decreased mobility, Decreased knowledge of precautions, Decreased endurance, Decreased range of motion  Visit Diagnosis: Muscle weakness (generalized)  Other symptoms and signs involving the musculoskeletal system     Problem List Patient Active Problem List   Diagnosis Date Noted  . Anemia due to antineoplastic chemotherapy 03/26/2018  . Tinnitus 02/06/2018  . Leukopenia due to antineoplastic chemotherapy ([place]) 01/30/2018  . Chemotherapy-induced thrombocytopenia 01/24/2018  . AKI (acute kidney injury) ([place]) 01/17/2018  . Chemotherapy-induced nausea 01/17/2018  . Dysgeusia 01/17/2018  . Malnutrition ([place]) 01/17/2018  . Mucositis due to chemotherapy 01/17/2018  . Port-A-Cath in place 01/09/2018  . Malignant neoplasm of tonsillar fossa ([place]) 11/30/2017    Zannie Cove, PT 04/08/2018, 5:10 PM  [facility] [address], [place], [place], [address] Phone: [phone]   Fax:  [phone]  Name: Catcher Dehoyos MRN: [id number] Date of Birth: [birth year]

## 2018-04-10 ENCOUNTER — Encounter: Payer: Self-pay | Admitting: Physical Therapy

## 2018-04-10 ENCOUNTER — Ambulatory Visit: Payer: 59 | Admitting: Physical Therapy

## 2018-04-10 DIAGNOSIS — M6281 Muscle weakness (generalized): Secondary | ICD-10-CM | POA: Diagnosis not present

## 2018-04-10 DIAGNOSIS — R29898 Other symptoms and signs involving the musculoskeletal system: Secondary | ICD-10-CM

## 2018-04-10 NOTE — Therapy (Signed)
[facility] [address] [place], [place], [address] Phone: [phone]   Fax:  [phone]  Physical Therapy Treatment  Patient Details  Name: Thomas Mccall MRN: [id number] Date of Birth: 15-Aug-1960 Referring Provider (PT): Dr. Eppie Gibson   Encounter Date: 04/10/2018  PT End of Session - 04/10/18 1616    Visit Number  6    Number of Visits  13    Date for PT Re-Evaluation  04/19/18    PT Start Time  1615    PT Stop Time  1655    PT Time Calculation (min)  40 min    Activity Tolerance  Patient limited by fatigue    Behavior During Therapy  [facility] for tasks assessed/performed       Past Medical History:  Diagnosis Date  . Headache   . Mass    right tonsil  . Wears glasses     Past Surgical History:  Procedure Laterality Date  . DIRECT LARYNGOSCOPY Right 11/12/2017   Procedure: DIRECT LARYNGOSCOPY;  Surgeon: Melissa Montane, MD;  Location: [place];  Service: ENT;  Laterality: Right;  Direct laryngoscopy and biopy of right tonsil mass  . IR GASTROSTOMY TUBE MOD SED  12/26/2017  . IR IMAGING GUIDED PORT INSERTION  12/26/2017  . TONSILLECTOMY AND ADENOIDECTOMY  1970  . WISDOM TOOTH EXTRACTION      There were no vitals filed for this visit.  Subjective Assessment - 04/10/18 1617    Subjective  I felt ok after last treatment.    Currently in Pain?  No/denies    Multiple Pain Sites  No                       [facility] Adult PT Treatment/Exercise - 04/10/18 0001      Knee/Hip Exercises: Stretches   Active Hamstring Stretch  Right;Left;2 reps;20 seconds   VC to straighten knee   Other Knee/Hip Stretches  piriformis 2 x 30 sec bilat      Knee/Hip Exercises: Aerobic   Nustep  L2 x 9 min   PTA present to monitor status     Knee/Hip Exercises: Seated   Long Arc Quad  Strengthening;Both;1 set;15 reps;Weights    Long Arc Quad Weight  3 lbs.    Clamshell with TheraBand  Green   25x   Marching   Strengthening;Both;1 set;20 reps;Weights    Marching Limitations  3      Knee/Hip Exercises: Supine   Short Arc [employer];Both;1 set;20 reps    Short Arc Quad Sets Limitations  2# 3 sec holds    Bridges with Clamshell  --   2x 5     Shoulder Exercises: Seated   Extension  Strengthening;Both;15 reps;Theraband    Theraband Level (Shoulder Extension)  Level 3 (Green)    Row  Strengthening;Both;15 reps;Theraband    Theraband Level (Shoulder Row)  Level 3 (Green)    Horizontal ABduction  Strengthening;Both;15 reps;Theraband    Theraband Level (Shoulder Horizontal ABduction)  Level 3 (Green)    Diagonals  Strengthening;Both;15 reps;Theraband    Theraband Level (Shoulder Diagonals)  Level 3 (Green)                  PT Long Term Goals - 04/08/18 1652      PT LONG TERM GOAL #1   Title  Pt with increase the # of repetitions of sit to stand in 30 seconds to 13 indicating and impovement in  general strength     Baseline  still has a lot of fatigue    Status  On-going      PT LONG TERM GOAL #2   Title  Pt will decrease the time to do a normal TUG to < 7.9 seconds indicating an improvment in functional mobilty to age matched norms    Status  On-going      PT LONG TERM GOAL #3   Title  Pt will be independent in a home exercise program for strength and endurance     Status  On-going            Plan - 04/10/18 1616    Clinical Impression Statement  Pt arrives today reporting feeling ok after his last session, not too much post session fatigue. Pt was able to tolerated increased resistance on most of his LE strengthening exercises today with mild fatigue.      Rehab Potential  Excellent    Clinical Impairments Affecting Rehab Potential  port in right upper chest and feeding tube     PT Treatment/Interventions  Patient/family education;ADLs/Self Care Home Management;Therapeutic activities;Therapeutic exercise;Neuromuscular re-education;Manual techniques    PT Next Visit  Plan  f/u with fatigue levels, progress general strength and balance    PT Home Exercise Plan  neck ROM, walking    Consulted and Agree with Plan of Care  Patient       Patient will benefit from skilled therapeutic intervention in order to improve the following deficits and impairments:  Other (comment), Decreased mobility, Decreased knowledge of precautions, Decreased endurance, Decreased range of motion  Visit Diagnosis: Muscle weakness (generalized)  Other symptoms and signs involving the musculoskeletal system     Problem List Patient Active Problem List   Diagnosis Date Noted  . Anemia due to antineoplastic chemotherapy 03/26/2018  . Tinnitus 02/06/2018  . Leukopenia due to antineoplastic chemotherapy ([place]) 01/30/2018  . Chemotherapy-induced thrombocytopenia 01/24/2018  . AKI (acute kidney injury) ([place]) 01/17/2018  . Chemotherapy-induced nausea 01/17/2018  . Dysgeusia 01/17/2018  . Malnutrition ([place]) 01/17/2018  . Mucositis due to chemotherapy 01/17/2018  . Port-A-Cath in place 01/09/2018  . Malignant neoplasm of tonsillar fossa ([place]) 11/30/2017    Niyonna Betsill, PTA 04/10/2018, 4:55 PM  [facility] [address], [place], [place], [address] Phone: [phone]   Fax:  [phone]  Name: Thomas Mccall MRN: [id number] Date of Birth: 05-12-1960

## 2018-04-15 ENCOUNTER — Ambulatory Visit: Payer: 59 | Admitting: Physical Therapy

## 2018-04-15 ENCOUNTER — Encounter: Payer: Self-pay | Admitting: Physical Therapy

## 2018-04-15 DIAGNOSIS — M6281 Muscle weakness (generalized): Secondary | ICD-10-CM

## 2018-04-15 DIAGNOSIS — R29898 Other symptoms and signs involving the musculoskeletal system: Secondary | ICD-10-CM

## 2018-04-15 NOTE — Therapy (Signed)
[facility] [address] [place], [place], [address] Phone: [phone]   Fax:  [phone]  Physical Therapy Treatment  Patient Details  Name: Thomas Mccall MRN: [id number] Date of Birth: 1961/01/30 Referring Provider (PT): Dr. Eppie Gibson   Encounter Date: 04/15/2018  PT End of Session - 04/15/18 1618    Visit Number  7    Number of Visits  13    Date for PT Re-Evaluation  04/19/18    PT Start Time  6789    PT Stop Time  1657    PT Time Calculation (min)  43 min    Activity Tolerance  Patient limited by fatigue    Behavior During Therapy  [facility] for tasks assessed/performed       Past Medical History:  Diagnosis Date  . Headache   . Mass    right tonsil  . Wears glasses     Past Surgical History:  Procedure Laterality Date  . DIRECT LARYNGOSCOPY Right 11/12/2017   Procedure: DIRECT LARYNGOSCOPY;  Surgeon: Melissa Montane, MD;  Location: [place];  Service: ENT;  Laterality: Right;  Direct laryngoscopy and biopy of right tonsil mass  . IR GASTROSTOMY TUBE MOD SED  12/26/2017  . IR IMAGING GUIDED PORT INSERTION  12/26/2017  . TONSILLECTOMY AND ADENOIDECTOMY  1970  . WISDOM TOOTH EXTRACTION      There were no vitals filed for this visit.  Subjective Assessment - 04/15/18 1617    Subjective  I did ok after the last treatment.  I am a long way from where I was.  I worked 6 hours today.    Pertinent History  Diagnosis is right tonsil squamous cell carcinoma, stage I (cT2, cN1, cM0, p16+). Had TORS consult with [facility] surgeon, but decided against that. He has 35 treatments of radiation and 2 of 3 chemo treatments     Currently in Pain?  No/denies                       [facility] Adult PT Treatment/Exercise - 04/15/18 0001      Knee/Hip Exercises: Stretches   Active Hamstring Stretch  Right;Left;2 reps;20 seconds   VC to straighten knee   Other Knee/Hip Stretches  piriformis 2 x 30 sec bilat      Knee/Hip Exercises: Aerobic   Nustep  L2 x 82min   PT present to monitor status     Knee/Hip Exercises: Standing   Heel Raises  20 reps   VC to contract quads/knees wobbly     Knee/Hip Exercises: Seated   Long Arc Quad  Strengthening;Both;1 set;15 reps;Weights    Long Arc Quad Weight  3 lbs.    Clamshell with TheraBand  Green   25x   Marching  Strengthening;Both;1 set;20 reps;Weights    Marching Limitations  3    Hamstring Curl  Strengthening;Right;Left;1 set;10 reps;Limitations    Hamstring Limitations  green band      Knee/Hip Exercises: Supine   Short Arc [employer];Both;1 set;20 reps    Short Arc Quad Sets Limitations  3# 3 sec hold    Heel Slides  Strengthening;Right;Left;1 set;15 reps   cues to activate core   Bridges  Strengthening;Both;5 reps;3 sets    Straight Leg Raises  --   1 rep with inability to control pelvic tiltling   Other Supine Knee/Hip Exercises  ball roll out -       Shoulder Exercises: Seated   Horizontal  ABduction  Strengthening;Both;15 reps;Theraband    Theraband Level (Shoulder Horizontal ABduction)  Level 3 (Green)    Diagonals  Strengthening;Both;15 reps;Theraband    Theraband Level (Shoulder Diagonals)  Level 3 (Green)      Shoulder Exercises: Standing   Extension  Strengthening;Both;Theraband;15 reps    Theraband Level (Shoulder Extension)  Level 3 (Green)    Row  Strengthening;Both;15 reps;Theraband    Theraband Level (Shoulder Row)  Level 3 (Green)                  PT Long Term Goals - 04/08/18 1652      PT LONG TERM GOAL #1   Title  Pt with increase the # of repetitions of sit to stand in 30 seconds to 13 indicating and impovement in general strength     Baseline  still has a lot of fatigue    Status  On-going      PT LONG TERM GOAL #2   Title  Pt will decrease the time to do a normal TUG to < 7.9 seconds indicating an improvment in functional mobilty to age matched norms    Status  On-going      PT LONG TERM GOAL  #3   Title  Pt will be independent in a home exercise program for strength and endurance     Status  On-going            Plan - 04/15/18 1655    Clinical Impression Statement  Pt did well during today's session.  He is still limited with fatigue needing a few extra breaks when doing diagonals and unable to complete straight leg raises correctly.  Progressed with some increased reps and resistance as detailed above.  Pt continues to need skilled PT to progress strength, endurance and balance.    PT Treatment/Interventions  Patient/family education;ADLs/Self Care Home Management;Therapeutic activities;Therapeutic exercise;Neuromuscular re-education;Manual techniques    PT Next Visit Plan  f/u with fatigue levels, progress general strength and balance    Consulted and Agree with Plan of Care  Patient       Patient will benefit from skilled therapeutic intervention in order to improve the following deficits and impairments:  Other (comment), Decreased mobility, Decreased knowledge of precautions, Decreased endurance, Decreased range of motion  Visit Diagnosis: Muscle weakness (generalized)  Other symptoms and signs involving the musculoskeletal system     Problem List Patient Active Problem List   Diagnosis Date Noted  . Anemia due to antineoplastic chemotherapy 03/26/2018  . Tinnitus 02/06/2018  . Leukopenia due to antineoplastic chemotherapy ([place]) 01/30/2018  . Chemotherapy-induced thrombocytopenia 01/24/2018  . AKI (acute kidney injury) ([place]) 01/17/2018  . Chemotherapy-induced nausea 01/17/2018  . Dysgeusia 01/17/2018  . Malnutrition ([place]) 01/17/2018  . Mucositis due to chemotherapy 01/17/2018  . Port-A-Cath in place 01/09/2018  . Malignant neoplasm of tonsillar fossa ([place]) 11/30/2017    Zannie Cove, PT 04/15/2018, 5:00 PM  [facility] [address], [place], [place], [address] Phone: [phone]   Fax:   [phone]  Name: Thomas Mccall MRN: [id number] Date of Birth: 16-Nov-1960

## 2018-04-16 ENCOUNTER — Inpatient Hospital Stay: Payer: 59

## 2018-04-16 ENCOUNTER — Inpatient Hospital Stay (HOSPITAL_BASED_OUTPATIENT_CLINIC_OR_DEPARTMENT_OTHER): Payer: 59 | Admitting: Hematology

## 2018-04-16 ENCOUNTER — Encounter: Payer: Self-pay | Admitting: Hematology

## 2018-04-16 VITALS — BP 104/75 | HR 82 | Temp 98.2°F | Resp 18 | Ht 70.0 in | Wt 151.5 lb

## 2018-04-16 DIAGNOSIS — C090 Malignant neoplasm of tonsillar fossa: Secondary | ICD-10-CM

## 2018-04-16 DIAGNOSIS — Z9221 Personal history of antineoplastic chemotherapy: Secondary | ICD-10-CM | POA: Diagnosis not present

## 2018-04-16 DIAGNOSIS — E46 Unspecified protein-calorie malnutrition: Secondary | ICD-10-CM

## 2018-04-16 DIAGNOSIS — C770 Secondary and unspecified malignant neoplasm of lymph nodes of head, face and neck: Secondary | ICD-10-CM

## 2018-04-16 DIAGNOSIS — T451X5S Adverse effect of antineoplastic and immunosuppressive drugs, sequela: Secondary | ICD-10-CM

## 2018-04-16 DIAGNOSIS — D6481 Anemia due to antineoplastic chemotherapy: Secondary | ICD-10-CM

## 2018-04-16 DIAGNOSIS — Z452 Encounter for adjustment and management of vascular access device: Secondary | ICD-10-CM | POA: Diagnosis not present

## 2018-04-16 DIAGNOSIS — H9313 Tinnitus, bilateral: Secondary | ICD-10-CM | POA: Diagnosis not present

## 2018-04-16 DIAGNOSIS — Z923 Personal history of irradiation: Secondary | ICD-10-CM

## 2018-04-16 DIAGNOSIS — T451X5A Adverse effect of antineoplastic and immunosuppressive drugs, initial encounter: Secondary | ICD-10-CM

## 2018-04-16 DIAGNOSIS — K1231 Oral mucositis (ulcerative) due to antineoplastic therapy: Secondary | ICD-10-CM

## 2018-04-16 DIAGNOSIS — Z95828 Presence of other vascular implants and grafts: Secondary | ICD-10-CM

## 2018-04-16 DIAGNOSIS — Z931 Gastrostomy status: Secondary | ICD-10-CM

## 2018-04-16 DIAGNOSIS — Z79899 Other long term (current) drug therapy: Secondary | ICD-10-CM

## 2018-04-16 LAB — CBC WITH DIFFERENTIAL (CANCER CENTER ONLY)
Abs Immature Granulocytes: 0.01 10*3/uL (ref 0.00–0.07)
Basophils Absolute: 0 10*3/uL (ref 0.0–0.1)
Basophils Relative: 0 %
Eosinophils Absolute: 0 10*3/uL (ref 0.0–0.5)
Eosinophils Relative: 1 %
HCT: 35.8 % — ABNORMAL LOW (ref 39.0–52.0)
Hemoglobin: 11.8 g/dL — ABNORMAL LOW (ref 13.0–17.0)
Immature Granulocytes: 0 %
Lymphocytes Relative: 8 %
Lymphs Abs: 0.3 10*3/uL — ABNORMAL LOW (ref 0.7–4.0)
MCH: 30 pg (ref 26.0–34.0)
MCHC: 33 g/dL (ref 30.0–36.0)
MCV: 91.1 fL (ref 80.0–100.0)
Monocytes Absolute: 0.3 10*3/uL (ref 0.1–1.0)
Monocytes Relative: 8 %
Neutro Abs: 3.5 10*3/uL (ref 1.7–7.7)
Neutrophils Relative %: 83 %
Platelet Count: 171 10*3/uL (ref 150–400)
RBC: 3.93 MIL/uL — ABNORMAL LOW (ref 4.22–5.81)
RDW: 12.2 % (ref 11.5–15.5)
WBC Count: 4.2 10*3/uL (ref 4.0–10.5)
nRBC: 0 % (ref 0.0–0.2)

## 2018-04-16 LAB — CMP (CANCER CENTER ONLY)
ALT: 13 U/L (ref 0–44)
AST: 11 U/L — ABNORMAL LOW (ref 15–41)
Albumin: 4.3 g/dL (ref 3.5–5.0)
Alkaline Phosphatase: 71 U/L (ref 38–126)
Anion gap: 10 (ref 5–15)
BUN: 22 mg/dL — ABNORMAL HIGH (ref 6–20)
CO2: 28 mmol/L (ref 22–32)
Calcium: 9.9 mg/dL (ref 8.9–10.3)
Chloride: 104 mmol/L (ref 98–111)
Creatinine: 1.02 mg/dL (ref 0.61–1.24)
GFR, Est AFR Am: >60 mL/min (ref >60)
GFR, Estimated: >60 mL/min (ref >60)
Glucose, Bld: 100 mg/dL — ABNORMAL HIGH (ref 70–99)
Potassium: 4.4 mmol/L (ref 3.5–5.1)
Sodium: 142 mmol/L (ref 135–145)
Total Bilirubin: 0.5 mg/dL (ref 0.3–1.2)
Total Protein: 7.3 g/dL (ref 6.5–8.1)

## 2018-04-16 LAB — MAGNESIUM: Magnesium: 2.2 mg/dL (ref 1.7–2.4)

## 2018-04-16 MED ORDER — HEPARIN SOD (PORK) LOCK FLUSH 100 UNIT/ML IV SOLN
500.0000 [IU] | Freq: Once | INTRAVENOUS | Status: AC | PRN
  Administered 2018-04-16: 500 [IU]
  Filled 2018-04-16: qty 5

## 2018-04-16 MED ORDER — SODIUM CHLORIDE 0.9% FLUSH
10.0000 mL | INTRAVENOUS | Status: DC | PRN
  Administered 2018-04-16: 10 mL
  Filled 2018-04-16: qty 10

## 2018-04-16 NOTE — Progress Notes (Signed)
[place] OFFICE PROGRESS NOTE  Patient Care Team: Martinique, Betty G, MD as PCP - General (Family Medicine) Eppie Gibson, MD as Attending Physician (Radiation Oncology) Tish Men, MD as Consulting Physician (Hematology) Leota Sauers, RN as Oncology Nurse Navigator (Oncology) Karie Mainland, RD as Dietitian (Nutrition) Sharen Counter, CCC-SLP as Speech Language Pathologist (Speech Pathology) Wynelle Beckmann, Melodie Bouillon, PT as Physical Therapist (Physical Therapy)  HEME/ONC OVERVIEW: 1. 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, p16+ -S/p concurrent chemoradiation in end of 01/2018 -He is slowly improving, including PO intake and swallow function -PET ordered for early 05/2018 but not yet scheduled; we will reach out to the radiology dept to set up the appt -I encouraged the patient to adhere to jaw and shoulder exercises, and to increase PO intake as tolerated -I also encouraged the patient to maintain adequate oral hygiene, including salt/soda rinses and tongue brushing  Chemotherapy-induced tinnitus -Overall improving; he has returned to work without significant hearing limitation  -We will continue to monitor it for now  Chemotherapy-induced anemia -Secondary to chemotherapy  -Hgb 11.8 today, improving -We will continue to monitor it for now   Protein malnutrition, weight loss -The patient is currently taking approximately 5 cans of Osmolite per day via feeding tube -His PO intake is improving but still very limited; approximately  2 lbs weight loss over the past 1-2 weeks -I encouraged the patient to increase his oral intake as tolerated -Once he is able to eat/drink adequately and maintaining his weight, then we can remove the feeding tube   Port access -Q6-8week flush for now, pending PET results  Orders Placed This Encounter  Procedures  . CBC with Differential (Cancer Center Only)    Standing Status:   Future    Standing Expiration Date:   05/21/2019  . CMP ([facility] only)    Standing Status:   Future    Standing Expiration Date:   05/21/2019  . TSH    Standing Status:   Future    Standing Expiration Date:   04/16/2019  . T4, free    Standing Status:   Future    Standing Expiration Date:   04/16/2019    All questions were answered. The patient knows to call the clinic with any problems, questions or concerns. No barriers to learning was detected.  A total of more than 25 minutes were spent face-to-face with the patient during this encounter and over half of that time was spent on counseling and coordination of care as outlined above.   Return on 05/22/2018 to follow up labs and PET scan results.   Tish Men, MD 04/16/2018 5:20 PM  CHIEF COMPLAINT: "I am doing a little better"  INTERVAL HISTORY: [name] returns to clinic for follow-up of left tonsillar cancer status post chemoradiation.  Patient reports that he is eating somewhat better, but overall his oral intake is still limited.  He is taking in 5 boxes of Osmolite per day via feeding tube.  His taste is slowly improving.  He lost about  2 pounds over the past 1 to 2 weeks.  He is drinking some water by mouth, but is also taking in water via the feeding tube in the morning and at night.  He has returned to work and is working 5 to 6 hours/day.  He denies any fever, chill, night sweats, lymphadenopathy, dysphagia, odynophagia, chest pain, dyspnea, abdominal pain, nausea, vomiting, or diarrhea.  SUMMARY OF ONCOLOGIC HISTORY:   Malignant neoplasm of  tonsillar fossa ([place])   10/30/2017 Miscellaneous    Presented to Dr. Redmond Baseman with 3 months of R neck mass    11/07/2017 Imaging    CT neck w/ contrast:  1. Right tonsillar mass measures up to 2.6 cm, highly concerning for a squamous cell carcinoma. 2. Enlarged homogeneous right level 2 lymph node measures up to 3.9 cm, consistent with metastatic disease. 3. Adjacent right level 3 lymph node measures 1.1 cm. 4. Lucency at C4 on the left is likely degenerative. Metastatic disease is not excluded. Please correlate with findings on PET scan if used for further evaluation.    11/12/2017 Procedure    R tonsillar biopsy by Dr. Janace Hoard    11/12/2017 Pathology Results    Accession: [id number] Invasive squamous cell carcinoma; p16 diffusely positive    11/28/2017 Imaging    PET scan: 1. Right tonsillar fossa mass is intensely hypermetabolic compatible with primary neoplasm. 2. Solitary enlarged and hypermetabolic right level 2 cervical lymph node compatible with metastatic adenopathy. 3. No evidence for FDG avid hypermetabolic distant metastasis.    11/30/2017 Cancer Staging    Staging form: Pharynx - HPV-Mediated Oropharynx, AJCC 8th Edition - Clinical stage from 11/30/2017: Stage I (cT2, cN1, cM0, p16+) - Signed by Eppie Gibson, MD on 11/30/2017    12/07/2017 Miscellaneous    ENT evaluation to [facility] recommended against surgical resection due to the tumor extending into the right inferior nasopharynx and posterior soft palate, which would require resection of almost 1/2 of the soft palate and the right nasopharyngeal wall extending into right posterior/lateral posterior wall.     Procedure    12/26/2017: port and PEG tube placement     12/12/2017 -  Chemotherapy    The patient had palonosetron (ALOXI) injection 0.25 mg, 0.25 mg, Intravenous,  Once, 2 of 3 cycles Administration: 0.25 mg (12/28/2017), 0.25 mg (01/25/2018) CISplatin (PLATINOL) 196 mg in sodium chloride 0.9 % 500 mL chemo infusion,  100 mg/m2 = 196 mg, Intravenous,  Once, 2 of 3 cycles Dose modification: 80 mg/m2 (original dose 100 mg/m2, Cycle 2, Reason: Dose not tolerated, Comment: Neutropenia) Administration: 196 mg (12/28/2017), 157 mg (01/25/2018) fosaprepitant (EMEND) 150 mg, dexamethasone (DECADRON) 12 mg in sodium chloride 0.9 % 145 mL IVPB, , Intravenous,  Once, 2 of 3 cycles Administration:  (12/28/2017),  (01/25/2018)  for chemotherapy treatment.      REVIEW OF SYSTEMS:   Constitutional: ( - ) fevers, ( - )  chills , ( - ) night sweats Eyes: ( - ) blurriness of vision, ( - ) double vision, ( - ) watery eyes Ears, nose, mouth, throat, and face: ( - ) mucositis, ( - ) sore throat Respiratory: ( - ) cough, ( - ) dyspnea, ( - ) wheezes Cardiovascular: ( - ) palpitation, ( - ) chest discomfort, ( - ) lower extremity swelling Gastrointestinal:  ( - ) nausea, ( - ) heartburn, ( - ) change in bowel habits Skin: ( - ) abnormal skin rashes Lymphatics: ( - ) new lymphadenopathy, ( - )  easy bruising Neurological: ( - ) numbness, ( - ) tingling, ( - ) new weaknesses Behavioral/Psych: ( - ) mood change, ( - ) new changes  All other systems were reviewed with the patient and are negative.  I have reviewed the past medical history, past surgical history, social history and family history with the patient and they are unchanged from previous note.  ALLERGIES:  has No Known Allergies.  MEDICATIONS:  Current Outpatient Medications  Medication Sig Dispense Refill  . lidocaine-prilocaine (EMLA) cream Apply to affected area once 30 g 3  . Nutritional Supplements (FEEDING SUPPLEMENT, OSMOLITE 1.5 CAL,) LIQD Begin 1.5 bottles Osmolite 1.5 via PEG QID with 60 mL free water before and after bolus feedings. Mix 30 mL Promod with 60 mL water BID and put into PEG. Flush with 30 mL water. Do not mix Promod with Osmolite 1.5 6 Bottle 0  . sodium fluoride (PREVIDENT) 1.1 % GEL dental gel Place 1 application onto teeth at bedtime.  Applied to to fresh. Brush teeth at bedtime for 2 minutes. Spit out excess. Do not rinse afterwards.    . Morphine Sulfate (MORPHINE CONCENTRATE) 10 mg / 0.5 ml concentrated solution Place 10 mg into feeding tube every 3 (three) hours as needed for moderate pain or severe pain.    Marland Kitchen ondansetron (ZOFRAN) 8 MG tablet Take 8 mg by mouth every 8 (eight) hours as needed for nausea or vomiting.    . prochlorperazine (COMPAZINE) 10 MG tablet Take 1 tablet (10 mg total) by mouth every 6 (six) hours as needed (Nausea or vomiting). (Patient not taking: Reported on 03/06/2018) 30 tablet 1   No current facility-administered medications for this visit.     PHYSICAL EXAMINATION: ECOG PERFORMANCE STATUS: 1 - Symptomatic but completely ambulatory  Today's Vitals   04/16/18 1541 04/16/18 1551  BP: 104/75   Pulse: 82   Resp: 18   Temp: 98.2 F (36.8 C)   TempSrc: Oral   SpO2: 100%   Weight: 151 lb 8 oz (68.7 kg)   Height: 5\' 10"  (1.778 m)   PainSc:  0-No pain   Body mass index is 21.74 kg/m.  Filed Weights   04/16/18 1541  Weight: 151 lb 8 oz (68.7 kg)    GENERAL: alert, no distress and comfortable SKIN: skin color, texture, turgor are normal, no rashes or significant lesions EYES: conjunctiva are pink and non-injected, sclera clear OROPHARYNX: white plaques over the tongue, no evidence of fungal infection  NECK: supple, non-tender LYMPH:  A small right cervical LN (<1cm), mobile, non-tender, no other cervical lymphadenopathy  LUNGS: clear to auscultation with normal breathing effort HEART: regular rate & rhythm and no murmurs and no lower extremity edema ABDOMEN: soft, non-tender, non-distended, normal bowel sounds Musculoskeletal: no cyanosis of digits and no clubbing  PSYCH: alert & oriented x 3, fluent speech NEURO: no focal motor/sensory deficits  LABORATORY DATA:  I have reviewed the data as listed    Component Value Date/Time   NA 142 04/16/2018 1528   K 4.4 04/16/2018 1528    CL 104 04/16/2018 1528   CO2 28 04/16/2018 1528   GLUCOSE 100 (H) 04/16/2018 1528   BUN 22 (H) 04/16/2018 1528   CREATININE 1.02 04/16/2018 1528   CALCIUM 9.9 04/16/2018 1528   PROT 7.3 04/16/2018 1528   ALBUMIN 4.3 04/16/2018 1528   AST 11 (L) 04/16/2018 1528   ALT 13 04/16/2018 1528   ALKPHOS 71 04/16/2018 1528   BILITOT 0.5 04/16/2018 1528  GFRNONAA >60 04/16/2018 1528   GFRAA >60 04/16/2018 1528    No results found for: SPEP, UPEP  Lab Results  Component Value Date   WBC 4.2 04/16/2018   NEUTROABS 3.5 04/16/2018   HGB 11.8 (L) 04/16/2018   HCT 35.8 (L) 04/16/2018   MCV 91.1 04/16/2018   PLT 171 04/16/2018      Chemistry      Component Value Date/Time   NA 142 04/16/2018 1528   K 4.4 04/16/2018 1528   CL 104 04/16/2018 1528   CO2 28 04/16/2018 1528   BUN 22 (H) 04/16/2018 1528   CREATININE 1.02 04/16/2018 1528      Component Value Date/Time   CALCIUM 9.9 04/16/2018 1528   ALKPHOS 71 04/16/2018 1528   AST 11 (L) 04/16/2018 1528   ALT 13 04/16/2018 1528   BILITOT 0.5 04/16/2018 1528

## 2018-04-17 ENCOUNTER — Telehealth: Payer: Self-pay | Admitting: Hematology

## 2018-04-17 ENCOUNTER — Encounter: Payer: Self-pay | Admitting: Hematology

## 2018-04-17 ENCOUNTER — Ambulatory Visit: Payer: 59 | Admitting: Physical Therapy

## 2018-04-17 DIAGNOSIS — R29898 Other symptoms and signs involving the musculoskeletal system: Secondary | ICD-10-CM

## 2018-04-17 DIAGNOSIS — M6281 Muscle weakness (generalized): Secondary | ICD-10-CM

## 2018-04-17 NOTE — Therapy (Addendum)
[facility] [address] [place], [place], [address] Phone: [phone]   Fax:  [phone]  Physical Therapy Treatment/Progress note   Patient Details  Name: Thomas Mccall MRN: [id number] Date of Birth: 1961-03-16 Referring Provider (PT): Dr. Eppie Gibson  Progress Note Reporting Period 02/28/18 to 04/17/18  See note below for Objective Data and Assessment of Progress/Goals.      Encounter Date: 04/17/2018  PT End of Session - 04/17/18 1626    Visit Number  8    Date for PT Re-Evaluation  05/29/18    PT Start Time  1626    PT Stop Time  1702    PT Time Calculation (min)  36 min    Activity Tolerance  Patient limited by fatigue    Behavior During Therapy  [facility] for tasks assessed/performed       Past Medical History:  Diagnosis Date  . Headache   . Mass    right tonsil  . Wears glasses     Past Surgical History:  Procedure Laterality Date  . DIRECT LARYNGOSCOPY Right 11/12/2017   Procedure: DIRECT LARYNGOSCOPY;  Surgeon: Melissa Montane, MD;  Location: [place];  Service: ENT;  Laterality: Right;  Direct laryngoscopy and biopy of right tonsil mass  . IR GASTROSTOMY TUBE MOD SED  12/26/2017  . IR IMAGING GUIDED PORT INSERTION  12/26/2017  . TONSILLECTOMY AND ADENOIDECTOMY  1970  . WISDOM TOOTH EXTRACTION      There were no vitals filed for this visit.  Subjective Assessment - 04/17/18 1721    Subjective  Pt reports he is feeling okay today.  pt arrived late to PT today    Pertinent History  Diagnosis is right tonsil squamous cell carcinoma, stage I (cT2, cN1, cM0, p16+). Had TORS consult with [facility] surgeon, but decided against that. He has 35 treatments of radiation and 2 of 3 chemo treatments     Patient Stated Goals  want to build up my strength and endurance    Currently in Pain?  No/denies         [facility] PT Assessment - 04/17/18 0001      Assessment   Medical Diagnosis  stage I right tonsil  squamous cell carcinoma    Referring Provider (PT)  Dr. Eppie Gibson    Onset Date/Surgical Date  11/18/17    Hand Dominance  Right      Sit to Stand   Comments  16x in 30 sec      Posture/Postural Control   Posture Comments  rounded shoulders      Strength   Right Hand Grip (lbs)  79/79    Left Hand Grip (lbs)  71/75      6 minute walk test results    Aerobic Endurance Distance Walked  1760      Timed Up and Go Test   Normal TUG (seconds)  6                   [facility] Adult PT Treatment/Exercise - 04/17/18 0001      Knee/Hip Exercises: Standing   Heel Raises  20 reps   standing on foam mat   Hip Abduction  Stengthening;Right;Left;10 reps;Knee straight      Knee/Hip Exercises: Seated   Long Arc Quad  Strengthening;Both;1 set;15 reps;Weights    Long Arc Quad Weight  3 lbs.    Clamshell with TheraBand  Green   30x   Marching  Strengthening;Weights;Right;Left;15 reps  Marching Limitations  3    Hamstring Curl  Strengthening;Right;Left;10 reps;Limitations;2 sets    Hamstring Limitations  green band      Knee/Hip Exercises: Supine   Bridges with Diona Foley Squeeze  Strengthening;Both;10 reps    Bridges with Clamshell  Strengthening;Both;1 set;10 reps                  PT Long Term Goals - 04/17/18 1628      PT LONG TERM GOAL #1   Title  Pt with increase the # of repetitions of sit to stand in 30 seconds to 13 indicating and impovement in general strength     Baseline  16    Status  Achieved      PT LONG TERM GOAL #2   Title  Pt will decrease the time to do a normal TUG to < 7.9 seconds indicating an improvment in functional mobilty to age matched norms    Baseline  5.67 sec    Status  Achieved      PT LONG TERM GOAL #3   Title  Pt will be independent in a home exercise program for strength and endurance     Baseline  still working on increased endurance    Time  6    Period  Weeks    Status  On-going    Target Date  05/29/18      PT LONG TERM  GOAL #4   Title  Pt will be able to improve 6 MWT to 2000 feet or more due to improved endurance    Baseline  1760    Time  6    Period  Weeks    Status  New    Target Date  05/29/18            Plan - 04/17/18 1716    Clinical Impression Statement  Pt is tolerating exercises more easily with increased reps.  He was able to meet and exceed several goals.  He continues to have difficulty lifting things and has decreased endurance to get back to normal full time at his job.  He will benefit from skilled PT to progress strengh and endurance for improved function and maximum return to functional activities.    Rehab Potential  Excellent    Clinical Impairments Affecting Rehab Potential  port in right upper chest and feeding tube     PT Frequency  2x / week    PT Duration  6 weeks    PT Treatment/Interventions  Patient/family education;ADLs/Self Care Home Management;Therapeutic activities;Therapeutic exercise;Neuromuscular re-education;Manual techniques    PT Next Visit Plan  progress strength, endurance and balance, and core strength with caution due to feeding tube    PT Home Exercise Plan  Access Code: [email or web address]     Consulted and Agree with Plan of Care  Patient       Patient will benefit from skilled therapeutic intervention in order to improve the following deficits and impairments:  Other (comment), Decreased mobility, Decreased knowledge of precautions, Decreased endurance, Decreased range of motion  Visit Diagnosis: Muscle weakness (generalized)  Other symptoms and signs involving the musculoskeletal system     Problem List Patient Active Problem List   Diagnosis Date Noted  . Anemia due to antineoplastic chemotherapy 03/26/2018  . Tinnitus 02/06/2018  . Leukopenia due to antineoplastic chemotherapy ([place]) 01/30/2018  . Chemotherapy-induced thrombocytopenia 01/24/2018  . AKI (acute kidney injury) ([place]) 01/17/2018  . Chemotherapy-induced nausea 01/17/2018  . Dysgeusia  01/17/2018  . Malnutrition ([place])  01/17/2018  . Mucositis due to chemotherapy 01/17/2018  . Port-A-Cath in place 01/09/2018  . Malignant neoplasm of tonsillar fossa ([place]) 11/30/2017    Zannie Cove, PT 04/17/2018, 5:24 PM  [facility] [address], [place], [place], [address] Phone: [phone]   Fax:  [phone]  Name: Thomas Mccall MRN: [id number] Date of Birth: [birth year]   *addendum to include progress note reporting period  5:16 PM,04/30/18 Sherol Dade PT, [place] [facility] [place]

## 2018-04-17 NOTE — Telephone Encounter (Signed)
Called patient to inform patient of scheduled appt per 01/28 los.  Appts were already scheduled.

## 2018-04-18 ENCOUNTER — Encounter (HOSPITAL_COMMUNITY): Payer: Self-pay | Admitting: Dentistry

## 2018-04-19 ENCOUNTER — Telehealth: Payer: Self-pay | Admitting: Hematology & Oncology

## 2018-04-19 NOTE — Telephone Encounter (Signed)
PATIENT CONFIRMED 04/22/18 APPT AT 1130 AM WITH DR [facility] PER 1/30 [place] MSG

## 2018-04-22 ENCOUNTER — Ambulatory Visit: Payer: 59 | Attending: Radiation Oncology | Admitting: Physical Therapy

## 2018-04-22 ENCOUNTER — Other Ambulatory Visit: Payer: Self-pay

## 2018-04-22 ENCOUNTER — Ambulatory Visit (HOSPITAL_BASED_OUTPATIENT_CLINIC_OR_DEPARTMENT_OTHER)
Admission: RE | Admit: 2018-04-22 | Discharge: 2018-04-22 | Disposition: A | Discharge status: Home / Self Care | Payer: 59 | Source: Ambulatory Visit | Attending: Hematology | Admitting: Hematology

## 2018-04-22 ENCOUNTER — Inpatient Hospital Stay: Payer: 59 | Attending: Hematology | Admitting: Hematology

## 2018-04-22 ENCOUNTER — Encounter: Payer: Self-pay | Admitting: Hematology

## 2018-04-22 ENCOUNTER — Encounter: Payer: Self-pay | Admitting: Physical Therapy

## 2018-04-22 VITALS — BP 106/63 | HR 79 | Temp 98.1°F | Resp 18 | Ht 70.0 in | Wt 152.0 lb

## 2018-04-22 DIAGNOSIS — H9313 Tinnitus, bilateral: Secondary | ICD-10-CM | POA: Insufficient documentation

## 2018-04-22 DIAGNOSIS — E46 Unspecified protein-calorie malnutrition: Secondary | ICD-10-CM | POA: Insufficient documentation

## 2018-04-22 DIAGNOSIS — R29898 Other symptoms and signs involving the musculoskeletal system: Secondary | ICD-10-CM | POA: Insufficient documentation

## 2018-04-22 DIAGNOSIS — L989 Disorder of the skin and subcutaneous tissue, unspecified: Secondary | ICD-10-CM | POA: Insufficient documentation

## 2018-04-22 DIAGNOSIS — M6281 Muscle weakness (generalized): Secondary | ICD-10-CM | POA: Diagnosis not present

## 2018-04-22 DIAGNOSIS — R1310 Dysphagia, unspecified: Secondary | ICD-10-CM | POA: Diagnosis present

## 2018-04-22 DIAGNOSIS — R221 Localized swelling, mass and lump, neck: Secondary | ICD-10-CM | POA: Diagnosis not present

## 2018-04-22 DIAGNOSIS — C090 Malignant neoplasm of tonsillar fossa: Secondary | ICD-10-CM | POA: Insufficient documentation

## 2018-04-22 DIAGNOSIS — Z95828 Presence of other vascular implants and grafts: Secondary | ICD-10-CM | POA: Diagnosis not present

## 2018-04-22 DIAGNOSIS — Z79899 Other long term (current) drug therapy: Secondary | ICD-10-CM | POA: Diagnosis not present

## 2018-04-22 NOTE — Progress Notes (Addendum)
[place] OFFICE PROGRESS NOTE  Patient Care Team: Martinique, Betty G, MD as PCP - General (Family Medicine) Eppie Gibson, MD as Attending Physician (Radiation Oncology) Tish Men, MD as Consulting Physician (Hematology) Leota Sauers, RN as Oncology Nurse Navigator (Oncology) Karie Mainland, RD as Dietitian (Nutrition) Sharen Counter, CCC-SLP as Speech Language Pathologist (Speech Pathology) Wynelle Beckmann, Melodie Bouillon, PT as Physical Therapist (Physical Therapy)  HEME/ONC OVERVIEW: 1. 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 PEG and port placed in 12/2017   ASSESSMENT & PLAN:   Subcutaneous lesion of the back  -New, noted incidentally at home; ~1cm, mobile, firm; no associated tenderness, overlying erythema or ulceration -Ddx includes cyst vs. 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 intake as tolerated, with  the goal of removing the feeding tube in the near future if he is able to eat/drink adequately and maintaining his weight  Port access -Q6-8wk port flush, pending PET results   Orders Placed This Encounter  Procedures  . [place] CHEST SOFT TISSUE    Standing Status:   Future    Standing Expiration Date:   06/21/2019    Order Specific Question:   Reason for Exam (SYMPTOM  OR DIAGNOSIS REQUIRED)    Answer:   Nodule on the back, possibly a cyst; hx of H&N cancer completed chemoradiation    Order Specific Question:   Preferred imaging location?    Answer:   [facility]   All questions were answered. The patient knows to call the clinic with any problems, questions or concerns. No barriers to learning was detected.  Return in 05/2018 after PET as scheduled.   Tish Men, MD 04/22/2018 12:24 PM  CHIEF COMPLAINT: "I got a new knot on my back"  INTERVAL HISTORY: Mr. Roylance returns to clinic for evaluation of a new nodule on his upper back.  Patient reports that his wife was scratching his back last week when she noticed small nodule in the upper back just lateral to the thoracic spine.  The nodule is firm, mobile, nontender, without any overlying erythema or ulceration.  He is unaware of the nodule before last week.  Since he noticed it, the nodule has not changed in size.  He is still taking 5 cans of Osmolite via feeding tube.  He reports occasional bilateral tinnitus, but overall it is improving.  He denies any other complaint today.  SUMMARY OF ONCOLOGIC HISTORY:   Malignant neoplasm of tonsillar fossa ([place])   10/30/2017 Miscellaneous  Presented to Dr. Redmond Baseman with 3 months of R neck mass    11/07/2017 Imaging    CT neck w/ contrast:  1. Right tonsillar mass measures up to 2.6 cm, highly concerning for a squamous cell carcinoma. 2. Enlarged homogeneous right level 2 lymph node measures up to 3.9 cm, consistent with metastatic disease. 3. Adjacent right level 3 lymph node measures 1.1  cm. 4. Lucency at C4 on the left is likely degenerative. Metastatic disease is not excluded. Please correlate with findings on PET scan if used for further evaluation.    11/12/2017 Procedure    R tonsillar biopsy by Dr. Janace Hoard    11/12/2017 Pathology Results    Accession: [id number] Invasive squamous cell carcinoma; p16 diffusely positive    11/28/2017 Imaging    PET scan: 1. Right tonsillar fossa mass is intensely hypermetabolic compatible with primary neoplasm. 2. Solitary enlarged and hypermetabolic right level 2 cervical lymph node compatible with metastatic adenopathy. 3. No evidence for FDG avid hypermetabolic distant metastasis.    11/30/2017 Cancer Staging    Staging form: Pharynx - HPV-Mediated Oropharynx, AJCC 8th Edition - Clinical stage from 11/30/2017: Stage I (cT2, cN1, cM0, p16+) - Signed by Eppie Gibson, MD on 11/30/2017    12/07/2017 Miscellaneous    ENT evaluation to [facility] recommended against surgical resection due to the tumor extending into the right inferior nasopharynx and posterior soft palate, which would require resection of almost 1/2 of the soft palate and the right nasopharyngeal wall extending into right posterior/lateral posterior wall.     Procedure    12/26/2017: port and PEG tube placement     12/12/2017 -  Chemotherapy    The patient had palonosetron (ALOXI) injection 0.25 mg, 0.25 mg, Intravenous,  Once, 2 of 3 cycles Administration: 0.25 mg (12/28/2017), 0.25 mg (01/25/2018) CISplatin (PLATINOL) 196 mg in sodium chloride 0.9 % 500 mL chemo infusion, 100 mg/m2 = 196 mg, Intravenous,  Once, 2 of 3 cycles Dose modification: 80 mg/m2 (original dose 100 mg/m2, Cycle 2, Reason: Dose not tolerated, Comment: Neutropenia) Administration: 196 mg (12/28/2017), 157 mg (01/25/2018) fosaprepitant (EMEND) 150 mg, dexamethasone (DECADRON) 12 mg in sodium chloride 0.9 % 145 mL IVPB, , Intravenous,  Once, 2 of 3 cycles Administration:  (12/28/2017),  (01/25/2018)  for  chemotherapy treatment.      REVIEW OF SYSTEMS:   Constitutional: ( - ) fevers, ( - )  chills , ( - ) night sweats Eyes: ( - ) blurriness of vision, ( - ) double vision, ( - ) watery eyes Ears, nose, mouth, throat, and face: ( - ) mucositis, ( - ) sore throat Respiratory: ( - ) cough, ( - ) dyspnea, ( - ) wheezes Cardiovascular: ( - ) palpitation, ( - ) chest discomfort, ( - ) lower extremity swelling Gastrointestinal:  ( - ) nausea, ( - ) heartburn, ( - ) change in bowel habits Skin: ( + ) abnormal skin rashes Lymphatics: ( - ) new lymphadenopathy, ( - ) easy bruising Neurological: ( - ) numbness, ( - ) tingling, ( - ) new weaknesses Behavioral/Psych: ( - ) mood change, ( - ) new changes  All other systems were reviewed with the patient and are negative.  I have reviewed the past medical history, past surgical history, social history and family history with the patient and they are unchanged from previous note.  ALLERGIES:  has No Known Allergies.  MEDICATIONS:  Current Outpatient Medications  Medication Sig Dispense Refill  . lidocaine-prilocaine (  EMLA) cream Apply to affected area once 30 g 3  . Morphine Sulfate (MORPHINE CONCENTRATE) 10 mg / 0.5 ml concentrated solution Place 10 mg into feeding tube every 3 (three) hours as needed for moderate pain or severe pain.    . Nutritional Supplements (FEEDING SUPPLEMENT, OSMOLITE 1.5 CAL,) LIQD Begin 1.5 bottles Osmolite 1.5 via PEG QID with 60 mL free water before and after bolus feedings. Mix 30 mL Promod with 60 mL water BID and put into PEG. Flush with 30 mL water. Do not mix Promod with Osmolite 1.5 6 Bottle 0  . ondansetron (ZOFRAN) 8 MG tablet Take 8 mg by mouth every 8 (eight) hours as needed for nausea or vomiting.    . prochlorperazine (COMPAZINE) 10 MG tablet Take 1 tablet (10 mg total) by mouth every 6 (six) hours as needed (Nausea or vomiting). 30 tablet 1  . sodium fluoride (PREVIDENT) 1.1 % GEL dental gel Place 1 application  onto teeth at bedtime. Applied to to fresh. Brush teeth at bedtime for 2 minutes. Spit out excess. Do not rinse afterwards.     No current facility-administered medications for this visit.     PHYSICAL EXAMINATION: ECOG PERFORMANCE STATUS: 0 - Asymptomatic  Today's Vitals   04/22/18 1144 04/22/18 1145  BP: 106/63   Pulse: 79   Resp: 18   Temp: 98.1 F (36.7 C)   TempSrc: Oral   SpO2: 100%   Weight: 152 lb (68.9 kg)   Height: 5\' 10"  (1.778 m)   PainSc: 0-No pain 0-No pain   Body mass index is 21.81 kg/m.  Filed Weights   04/22/18 1144  Weight: 152 lb (68.9 kg)    GENERAL: alert, no distress and comfortable SKIN: a small, slightly raised, <1cm nodule over the upper back, slightly firm, mobile, non-tender, no overlying erythema or skin ulceration  EYES: conjunctiva are pink and non-injected, sclera clear OROPHARYNX: no exudate, no erythema; lips, buccal mucosa, and tongue normal  NECK: supple, non-tender LYMPH:  A small ~1cm cervical LN palpated on the right, non-tender, mobile; no other lymphadenopathy LUNGS: clear to auscultation with normal breathing effort HEART: regular rate & rhythm and no murmurs and no lower extremity edema ABDOMEN: soft, non-tender, non-distended, normal bowel sounds Musculoskeletal: no cyanosis of digits and no clubbing  PSYCH: alert & oriented x 3, fluent speech NEURO: no focal motor/sensory deficits  LABORATORY DATA:  I have reviewed the data as listed    Component Value Date/Time   NA 142 04/16/2018 1528   K 4.4 04/16/2018 1528   CL 104 04/16/2018 1528   CO2 28 04/16/2018 1528   GLUCOSE 100 (H) 04/16/2018 1528   BUN 22 (H) 04/16/2018 1528   CREATININE 1.02 04/16/2018 1528   CALCIUM 9.9 04/16/2018 1528   PROT 7.3 04/16/2018 1528   ALBUMIN 4.3 04/16/2018 1528   AST 11 (L) 04/16/2018 1528   ALT 13 04/16/2018 1528   ALKPHOS 71 04/16/2018 1528   BILITOT 0.5 04/16/2018 1528   GFRNONAA >60 04/16/2018 1528   GFRAA >60 04/16/2018 1528     No results found for: SPEP, UPEP  Lab Results  Component Value Date   WBC 4.2 04/16/2018   NEUTROABS 3.5 04/16/2018   HGB 11.8 (L) 04/16/2018   HCT 35.8 (L) 04/16/2018   MCV 91.1 04/16/2018   PLT 171 04/16/2018      Chemistry      Component Value Date/Time   NA 142 04/16/2018 1528   K 4.4 04/16/2018 1528   CL  104 04/16/2018 1528   CO2 28 04/16/2018 1528   BUN 22 (H) 04/16/2018 1528   CREATININE 1.02 04/16/2018 1528      Component Value Date/Time   CALCIUM 9.9 04/16/2018 1528   ALKPHOS 71 04/16/2018 1528   AST 11 (L) 04/16/2018 1528   ALT 13 04/16/2018 1528   BILITOT 0.5 04/16/2018 1528

## 2018-04-22 NOTE — Progress Notes (Signed)
FMLA and medical records successfully faxed to [facility] at [phone]. Mailed copy to patient address on file.

## 2018-04-22 NOTE — Therapy (Signed)
[facility] [address] [place], [place], [address] Phone: [phone]   Fax:  [phone]  Physical Therapy Treatment  Patient Details  Name: Thomas Mccall MRN: [id number] Date of Birth: 01-Apr-1960 Referring Provider (PT): Dr. Eppie Gibson   Encounter Date: 04/22/2018  PT End of Session - 04/22/18 1620    Visit Number  9    Number of Visits  13    Date for PT Re-Evaluation  05/29/18    PT Start Time  1937    PT Stop Time  1649    PT Time Calculation (min)  34 min    Activity Tolerance  Patient limited by fatigue;Patient tolerated treatment well    Behavior During Therapy  [facility] for tasks assessed/performed       Past Medical History:  Diagnosis Date  . Headache   . Mass    right tonsil  . Wears glasses     Past Surgical History:  Procedure Laterality Date  . DIRECT LARYNGOSCOPY Right 11/12/2017   Procedure: DIRECT LARYNGOSCOPY;  Surgeon: Melissa Montane, MD;  Location: [place];  Service: ENT;  Laterality: Right;  Direct laryngoscopy and biopy of right tonsil mass  . IR GASTROSTOMY TUBE MOD SED  12/26/2017  . IR IMAGING GUIDED PORT INSERTION  12/26/2017  . TONSILLECTOMY AND ADENOIDECTOMY  1970  . WISDOM TOOTH EXTRACTION      There were no vitals filed for this visit.  Subjective Assessment - 04/22/18 1616    Subjective  Pt rates fatigue as 4/10 today.      Pertinent History  Diagnosis is right tonsil squamous cell carcinoma, stage I (cT2, cN1, cM0, p16+). Had TORS consult with [facility] surgeon, but decided against that. He has 35 treatments of radiation and 2 of 3 chemo treatments     Patient Stated Goals  want to build up my strength and endurance    Currently in Pain?  No/denies    Pain Onset  More than a month ago    Pain Frequency  Intermittent                       [facility] Adult PT Treatment/Exercise - 04/22/18 0001      Exercises   Exercises  Lumbar      Lumbar Exercises: Standing   Other Standing Lumbar Exercises  marching on foam pad, SLS on foam pad x 10 sec each      Lumbar Exercises: Supine   Bridge  15 reps      Knee/Hip Exercises: Aerobic   Nustep  L1x8 min       Knee/Hip Exercises: Standing   Hip Flexion  Stengthening;Both;1 set;20 reps;Knee bent   toe tap treadmill   Hip Flexion Limitations  3#    Hip Abduction  Stengthening;Both;1 set;10 reps    Abduction Limitations  1.5    Hip Extension  Stengthening;1 set;10 reps    Extension Limitations  3#      Knee/Hip Exercises: Seated   Long Arc Quad  Strengthening;Both;1 set;15 reps;Weights    Long Arc Quad Weight  3 lbs.    Clamshell with TheraBand  Green   30 reps   Marching  Strengthening    Marching Limitations  green band around thighs    Hamstring Curl  Strengthening;15 reps;1 set    Hamstring Limitations  green band      Knee/Hip Exercises: Supine   Straight Leg Raises  Both;1 set;5 reps  Shoulder Exercises: Supine   Horizontal ABduction  Strengthening;Theraband;15 reps    Diagonals  Strengthening;Both;10 reps;Theraband    Theraband Level (Shoulder Diagonals)  Level 2 (Red)                  PT Long Term Goals - 04/17/18 1628      PT LONG TERM GOAL #1   Title  Pt with increase the # of repetitions of sit to stand in 30 seconds to 13 indicating and impovement in general strength     Baseline  16    Status  Achieved      PT LONG TERM GOAL #2   Title  Pt will decrease the time to do a normal TUG to < 7.9 seconds indicating an improvment in functional mobilty to age matched norms    Baseline  5.67 sec    Status  Achieved      PT LONG TERM GOAL #3   Title  Pt will be independent in a home exercise program for strength and endurance     Baseline  still working on increased endurance    Time  6    Period  Weeks    Status  On-going    Target Date  05/29/18      PT LONG TERM GOAL #4   Title  Pt will be able to improve 6 MWT to 2000 feet or more due to improved endurance     Baseline  1760    Time  6    Period  Weeks    Status  New    Target Date  05/29/18            Plan - 04/22/18 1651    Clinical Impression Statement  Pt tolerated ther ex across multiple positions of standing, sitting and supine with targeted muscles groups in LEs and UEs today.  He rated fatigue increased to 5/10 end of session from 4/10 on arrival demonstrating improved tolerance of ther ex.  He was able to balance >10 sec in SLS bil on black foam pad today.  Pt will continue to benefit from skilled PT to improve endurance and strength along POC.    Rehab Potential  Excellent    Clinical Impairments Affecting Rehab Potential  port in right upper chest and feeding tube     PT Frequency  2x / week    PT Duration  6 weeks    PT Treatment/Interventions  Patient/family education;ADLs/Self Care Home Management;Therapeutic activities;Therapeutic exercise;Neuromuscular re-education;Manual techniques    PT Next Visit Plan  progress strength, endurance and balance, and core strength with caution due to feeding tube    PT Home Exercise Plan  Access Code: [email or web address]     Consulted and Agree with Plan of Care  Patient       Patient will benefit from skilled therapeutic intervention in order to improve the following deficits and impairments:  Other (comment), Decreased mobility, Decreased knowledge of precautions, Decreased endurance, Decreased range of motion  Visit Diagnosis: Muscle weakness (generalized)  Other symptoms and signs involving the musculoskeletal system     Problem List Patient Active Problem List   Diagnosis Date Noted  . Skin lesion of back 04/22/2018  . Anemia due to antineoplastic chemotherapy 03/26/2018  . Tinnitus 02/06/2018  . Leukopenia due to antineoplastic chemotherapy ([place]) 01/30/2018  . Chemotherapy-induced thrombocytopenia 01/24/2018  . AKI (acute kidney injury) ([place]) 01/17/2018  . Chemotherapy-induced nausea 01/17/2018  . Dysgeusia 01/17/2018  .  Malnutrition ([place]) 01/17/2018  .  Mucositis due to chemotherapy 01/17/2018  . Port-A-Cath in place 01/09/2018  . Malignant neoplasm of tonsillar fossa ([place]) 11/30/2017   Johanna Beuhring, PT 04/22/18 4:58 PM   [facility] [address] [place], [place], [address] Phone: [phone]   Fax:  [phone]  Name: Andrw Mcguirt MRN: [id number] Date of Birth: 07-Oct-1960

## 2018-04-23 ENCOUNTER — Telehealth: Payer: Self-pay | Admitting: *Deleted

## 2018-04-23 ENCOUNTER — Other Ambulatory Visit: Payer: Self-pay | Admitting: Hematology

## 2018-04-23 ENCOUNTER — Ambulatory Visit: Payer: Self-pay | Admitting: Hematology

## 2018-04-23 DIAGNOSIS — L989 Disorder of the skin and subcutaneous tissue, unspecified: Secondary | ICD-10-CM

## 2018-04-23 NOTE — Telephone Encounter (Signed)
Per MD, notified pt- unable to reach-lmovm the US showed two <1cm  Lesions in the back, like sebaceous cysts. MD has ordered  FNA of the cyst to rule out malignancy. It is unlikely this is malignant however to expect a call from scheduling with an appointment. Request for pt to call office to confirm message received.

## 2018-04-25 ENCOUNTER — Encounter

## 2018-04-26 ENCOUNTER — Telehealth: Payer: Self-pay | Admitting: *Deleted

## 2018-04-26 NOTE — Telephone Encounter (Signed)
Oncology Nurse Navigator Documentation  Spoke with Mr. Schutter re need for SLP follow-up, offered opportunity to see Glendell Docker at next Tuesday morning's H&N MDC.  He declined b/c working in the mornings, indicated he can schedule for afternoons.  I noted I will contact SLP scheduler.  Gayleen Orem, RN, BSN Head & Neck Oncology Nurse [place] at [place] [phone]

## 2018-04-30 ENCOUNTER — Ambulatory Visit: Payer: 59 | Admitting: Physical Therapy

## 2018-04-30 DIAGNOSIS — R29898 Other symptoms and signs involving the musculoskeletal system: Secondary | ICD-10-CM | POA: Diagnosis not present

## 2018-04-30 DIAGNOSIS — R1310 Dysphagia, unspecified: Secondary | ICD-10-CM | POA: Diagnosis not present

## 2018-04-30 DIAGNOSIS — M6281 Muscle weakness (generalized): Secondary | ICD-10-CM

## 2018-04-30 NOTE — Patient Instructions (Signed)
Access Code: [email or web address]  URL: [email or web address]  Date: 04/30/2018  Prepared by: Sherol Dade   Exercises  Sit to Stand without Arm Support - 10 reps - 3 sets - 1x daily - 7x weekly  Shoulder Extension with Resistance - Palms Forward - 10 reps - 3 sets - 1x daily - 7x weekly  Standing Shoulder Row with Anchored Resistance - 10 reps - 3 sets - 1x daily - 7x weekly  Bent Knee Fallouts - 10 reps - 3 sets - 1x daily - 7x weekly  Bridge - 10 reps - 3 sets - 1x daily - 7x weekly  Standing Hip Flexion - 10 reps - 3 sets - 1x daily - 7x weekly  Hip Extension with Resistance Loop - 10 reps - 3 sets - 1x daily - 7x weekly  Hip Abduction with Resistance Loop - 10 reps - 3 sets - 1x daily - 7x weekly    [facility] [address] [place], [place] [address] Phone # [phone] Fax [phone]

## 2018-04-30 NOTE — Therapy (Signed)
[facility] [address] [place], [place], [address] Phone: [phone]   Fax:  [phone]  Physical Therapy Treatment  Patient Details  Name: Thomas Mccall MRN: [id number] Date of Birth: Mar 06, 1961 Referring Provider (PT): Dr. Eppie Gibson   Encounter Date: 04/30/2018  PT End of Session - 04/30/18 1649    Visit Number  10    Number of Visits  13    Date for PT Re-Evaluation  05/29/18    PT Start Time  6754    PT Stop Time  1655    PT Time Calculation (min)  38 min    Activity Tolerance  Patient limited by fatigue;Patient tolerated treatment well    Behavior During Therapy  [facility] for tasks assessed/performed       Past Medical History:  Diagnosis Date  . Headache   . Mass    right tonsil  . Wears glasses     Past Surgical History:  Procedure Laterality Date  . DIRECT LARYNGOSCOPY Right 11/12/2017   Procedure: DIRECT LARYNGOSCOPY;  Surgeon: Melissa Montane, MD;  Location: [place];  Service: ENT;  Laterality: Right;  Direct laryngoscopy and biopy of right tonsil mass  . IR GASTROSTOMY TUBE MOD SED  12/26/2017  . IR IMAGING GUIDED PORT INSERTION  12/26/2017  . TONSILLECTOMY AND ADENOIDECTOMY  1970  . WISDOM TOOTH EXTRACTION      There were no vitals filed for this visit.      [facility] PT Assessment - 04/30/18 0001      Assessment   Medical Diagnosis  stage I right tonsil squamous cell carcinoma    Referring Provider (PT)  Dr. Eppie Gibson    Onset Date/Surgical Date  11/18/17      Balance Screen   Has the patient fallen in the past 6 months  No    Has the patient had a decrease in activity level because of a fear of falling?   No    Is the patient reluctant to leave their home because of a fear of falling?   No      Sit to Stand   Comments  x21 reps in 30 sec       Strength   Right Hand Grip (lbs)  79/79    Left Hand Grip (lbs)  71/75      6 minute walk test results    Aerobic Endurance Distance  Walked  1760   previous session                  [facility] Adult PT Treatment/Exercise - 04/30/18 0001      Knee/Hip Exercises: Aerobic   Tread Mill  x5 min, 15 sec at 1.7 and 45 sec at 1.5; demo for intervals that pt can use at home    Nustep  intervals L1 to L3 for 60 sec at a time. x5 min      Knee/Hip Exercises: Standing   Knee Flexion  Left;Right;Strengthening;2 sets;15 reps    Knee Flexion Limitations  3#    Hip Flexion  Stengthening;Right;Left;2 sets;10 reps    Hip Flexion Limitations  3#    Hip Abduction  Stengthening;Left;Right;1 set;15 reps;Knee straight    Abduction Limitations  yellow TB around ankles     Hip Extension  Stengthening;Right;Left;1 set;15 reps;Knee straight    Extension Limitations  yellow TB around ankles              PT Education - 04/30/18 1710  Education Details  updated HEP; discussed aerobic activity on treadmill at home     Person(s) Educated  Patient    Methods  Explanation;Handout    Comprehension  Returned demonstration;Verbalized understanding          PT Long Term Goals - 04/30/18 1721      PT LONG TERM GOAL #1   Title  Pt with increase the # of repetitions of sit to stand in 30 seconds to 13 indicating and impovement in general strength     Baseline  21    Status  Achieved      PT LONG TERM GOAL #2   Title  Pt will decrease the time to do a normal TUG to < 7.9 seconds indicating an improvment in functional mobilty to age matched norms    Baseline  5.67 sec    Status  Achieved      PT LONG TERM GOAL #3   Title  Pt will be independent in a home exercise program for strength and endurance     Baseline  still working on increased endurance    Time  6    Period  Weeks    Status  On-going      PT LONG TERM GOAL #4   Title  Pt will be able to improve 6 MWT to 2000 feet or more due to improved endurance    Baseline  1760    Time  6    Period  Weeks    Status  New            Plan - 04/30/18 1718     Clinical Impression Statement  Pt is making progress towards his goals, having demonstrated improved performance on 30 sec sit to stand this visit. Pt has a treadmill at home, however he has not been using it. Therapist educated pt on ways to begin a walking program and pt verbalized understanding. Therapist offered pt multiple rest breaks during today's session, however he denied any increase in fatigue. HEP was updated and he demonstrated understanding of this. Will continue with current POC.     Rehab Potential  Excellent    Clinical Impairments Affecting Rehab Potential  port in right upper chest and feeding tube     PT Frequency  2x / week    PT Duration  6 weeks    PT Treatment/Interventions  Patient/family education;ADLs/Self Care Home Management;Therapeutic activities;Therapeutic exercise;Neuromuscular re-education;Manual techniques    PT Next Visit Plan  progress strength, endurance and balance, and core strength with caution due to feeding tube    PT Home Exercise Plan  Access Code: [email or web address]     Consulted and Agree with Plan of Care  Patient       Patient will benefit from skilled therapeutic intervention in order to improve the following deficits and impairments:  Other (comment), Decreased mobility, Decreased knowledge of precautions, Decreased endurance, Decreased range of motion  Visit Diagnosis: Muscle weakness (generalized)  Other symptoms and signs involving the musculoskeletal system     Problem List Patient Active Problem List   Diagnosis Date Noted  . Skin lesion of back 04/22/2018  . Anemia due to antineoplastic chemotherapy 03/26/2018  . Tinnitus 02/06/2018  . Leukopenia due to antineoplastic chemotherapy ([place]) 01/30/2018  . Chemotherapy-induced thrombocytopenia 01/24/2018  . AKI (acute kidney injury) ([place]) 01/17/2018  . Chemotherapy-induced nausea 01/17/2018  . Dysgeusia 01/17/2018  . Malnutrition ([place]) 01/17/2018  . Mucositis due to chemotherapy 01/17/2018   . Port-A-Cath in place 01/09/2018  .  Malignant neoplasm of tonsillar fossa ([place]) 11/30/2017    5:23 PM,04/30/18 Sherol Dade PT, DPT [facility] [address] [place], [place], [address] Phone: [phone]   Fax:  [phone]  Name: Thomas Mccall MRN: [id number] Date of Birth: [birth year]

## 2018-05-02 ENCOUNTER — Ambulatory Visit: Payer: 59 | Admitting: Physical Therapy

## 2018-05-02 ENCOUNTER — Encounter: Payer: Self-pay | Admitting: Physical Therapy

## 2018-05-02 DIAGNOSIS — R29898 Other symptoms and signs involving the musculoskeletal system: Secondary | ICD-10-CM | POA: Diagnosis not present

## 2018-05-02 DIAGNOSIS — M6281 Muscle weakness (generalized): Secondary | ICD-10-CM | POA: Diagnosis not present

## 2018-05-02 DIAGNOSIS — R1310 Dysphagia, unspecified: Secondary | ICD-10-CM | POA: Diagnosis not present

## 2018-05-02 NOTE — Therapy (Signed)
[facility] [address], [place], [place], [address] Phone: [phone]   Fax:  [phone]  Physical Therapy Treatment  Patient Details  Name: Thomas Mccall MRN: [id number] Date of Birth: [birth year] Referring Provider (PT): Dr. Eppie Gibson   Encounter Date: 05/02/2018  PT End of Session - 05/02/18 1644    Visit Number  11    Number of Visits  13    Date for PT Re-Evaluation  05/29/18    PT Start Time  6812    PT Stop Time  1655    PT Time Calculation (min)  40 min    Activity Tolerance  Patient limited by fatigue;Patient tolerated treatment well    Behavior During Therapy  [facility] for tasks assessed/performed       Past Medical History:  Diagnosis Date  . Headache   . Mass    right tonsil  . Wears glasses     Past Surgical History:  Procedure Laterality Date  . DIRECT LARYNGOSCOPY Right 11/12/2017   Procedure: DIRECT LARYNGOSCOPY;  Surgeon: Melissa Montane, MD;  Location: [place];  Service: ENT;  Laterality: Right;  Direct laryngoscopy and biopy of right tonsil mass  . IR GASTROSTOMY TUBE MOD SED  12/26/2017  . IR IMAGING GUIDED PORT INSERTION  12/26/2017  . TONSILLECTOMY AND ADENOIDECTOMY  1970  . WISDOM TOOTH EXTRACTION      There were no vitals filed for this visit.  Subjective Assessment - 05/02/18 1616    Subjective  Pt states that he is a little more tired than normal today. He was not too bad after his last session.     Pertinent History  Diagnosis is right tonsil squamous cell carcinoma, stage I (cT2, cN1, cM0, p16+). Had TORS consult with [facility] surgeon, but decided against that. He has 35 treatments of radiation and 2 of 3 chemo treatments     Patient Stated Goals  want to build up my strength and endurance    Currently in Pain?  No/denies    Pain Onset  More than a month ago                       [facility] Adult PT Treatment/Exercise - 05/02/18 0001      Lumbar Exercises: Aerobic   Nustep  L1 increased 1 resistance level every 2 minutes, 8 min total       Knee/Hip Exercises: Standing   Other Standing Knee Exercises  hip abduction/extension slider x10 reps each, x5 reps each without UE support       Knee/Hip Exercises: Seated   Sit to Sand  2 sets;10 reps;without UE support;Other (comment)   last set without UE; on foam      Knee/Hip Exercises: Supine   Bridges  Strengthening;Both;2 sets;5 reps    Bridges Limitations  10 sec hold       Knee/Hip Exercises: Sidelying   Hip ABduction  Both;Strengthening;2 sets;10 reps    Hip ABduction Limitations  cues to decrease hip flexion      Shoulder Exercises: Supine   Flexion  Strengthening;Both;10 reps    Theraband Level (Shoulder Flexion)  Level 1 (Yellow)    Flexion Limitations  to 90 deg, x2 sets     Diagonals  Strengthening;Both;10 reps    Theraband Level (Shoulder Diagonals)  Level 2 (Red)    Diagonals Limitations  x2 sets              PT Education -  05/02/18 1643    Education Details  technique with therex    Person(s) Educated  Patient    Methods  Explanation;Verbal cues;Tactile cues    Comprehension  Verbalized understanding;Returned demonstration          PT Long Term Goals - 04/30/18 1721      PT LONG TERM GOAL #1   Title  Pt with increase the # of repetitions of sit to stand in 30 seconds to 13 indicating and impovement in general strength     Baseline  21    Status  Achieved      PT LONG TERM GOAL #2   Title  Pt will decrease the time to do a normal TUG to < 7.9 seconds indicating an improvment in functional mobilty to age matched norms    Baseline  5.67 sec    Status  Achieved      PT LONG TERM GOAL #3   Title  Pt will be independent in a home exercise program for strength and endurance     Baseline  still working on increased endurance    Time  6    Period  Weeks    Status  On-going      PT LONG TERM GOAL #4   Title  Pt will be able to improve 6 MWT to 2000 feet or more due to  improved endurance    Baseline  1760    Time  6    Period  Weeks    Status  New            Plan - 05/02/18 1705    Clinical Impression Statement  Pt arrived with increased levels of fatigue following a busy day at work. This was monitored during today's session and frequent rest breaks were provided as needed. Completed LE/UE strengthening therex with intermittent cues for proper technique, especially with sidelying hip abduction. Pt was able to complete all exercises without exacerbation of his fatigue and had no significant difficulty with any of the exercises introduced this visit. He was encouraged to attempt treadmill walking over the weekend and was agreeable with this.     Rehab Potential  Excellent    Clinical Impairments Affecting Rehab Potential  port in right upper chest and feeding tube     PT Frequency  2x / week    PT Duration  6 weeks    PT Treatment/Interventions  Patient/family education;ADLs/Self Care Home Management;Therapeutic activities;Therapeutic exercise;Neuromuscular re-education;Manual techniques    PT Next Visit Plan  progress strength, endurance and balance, and core strength with caution due to feeding tube    PT Home Exercise Plan  Access Code: [email or web address]     Consulted and Agree with Plan of Care  Patient       Patient will benefit from skilled therapeutic intervention in order to improve the following deficits and impairments:  Other (comment), Decreased mobility, Decreased knowledge of precautions, Decreased endurance, Decreased range of motion  Visit Diagnosis: Muscle weakness (generalized)  Other symptoms and signs involving the musculoskeletal system     Problem List Patient Active Problem List   Diagnosis Date Noted  . Skin lesion of back 04/22/2018  . Anemia due to antineoplastic chemotherapy 03/26/2018  . Tinnitus 02/06/2018  . Leukopenia due to antineoplastic chemotherapy ([place]) 01/30/2018  . Chemotherapy-induced thrombocytopenia  01/24/2018  . AKI (acute kidney injury) ([place]) 01/17/2018  . Chemotherapy-induced nausea 01/17/2018  . Dysgeusia 01/17/2018  . Malnutrition ([place]) 01/17/2018  . Mucositis due to chemotherapy 01/17/2018  .  Port-A-Cath in place 01/09/2018  . Malignant neoplasm of tonsillar fossa ([facility]) 11/30/2017     5:10 PM,05/02/18 Sherol Dade PT, DPT [place] [facility] [address] [place], [place], [address] Phone: [phone]   Fax:  [phone]  Name: Akshaj Besancon MRN: [id number] Date of Birth: [birth year]

## 2018-05-06 ENCOUNTER — Ambulatory Visit: Payer: 59

## 2018-05-06 ENCOUNTER — Ambulatory Visit: Payer: 59 | Admitting: Physical Therapy

## 2018-05-06 ENCOUNTER — Encounter: Payer: Self-pay | Admitting: Physical Therapy

## 2018-05-06 DIAGNOSIS — M6281 Muscle weakness (generalized): Secondary | ICD-10-CM | POA: Diagnosis not present

## 2018-05-06 DIAGNOSIS — R1310 Dysphagia, unspecified: Secondary | ICD-10-CM

## 2018-05-06 DIAGNOSIS — R29898 Other symptoms and signs involving the musculoskeletal system: Secondary | ICD-10-CM

## 2018-05-06 NOTE — Therapy (Signed)
[facility] [address] [place], [place], [address] Phone: [phone]   Fax:  [phone]  Physical Therapy Treatment  Patient Details  Name: Thomas Mccall MRN: [id number] Date of Birth: [birth year] Referring Provider (PT): Dr. Eppie Gibson   Encounter Date: 05/06/2018  PT End of Session - 05/06/18 1617    Visit Number  12    Number of Visits  13    Date for PT Re-Evaluation  05/29/18    Authorization Type  [employer]- 23 visit per year    Authorization - Visit Number  10   starting 03/20/2018   Authorization - Number of Visits  23    PT Start Time  6659    PT Stop Time  9357    PT Time Calculation (min)  40 min    Activity Tolerance  Patient tolerated treatment well    Behavior During Therapy  [facility] for tasks assessed/performed       Past Medical History:  Diagnosis Date  . Headache   . Mass    right tonsil  . Wears glasses     Past Surgical History:  Procedure Laterality Date  . DIRECT LARYNGOSCOPY Right 11/12/2017   Procedure: DIRECT LARYNGOSCOPY;  Surgeon: Melissa Montane, MD;  Location: [place];  Service: ENT;  Laterality: Right;  Direct laryngoscopy and biopy of right tonsil mass  . IR GASTROSTOMY TUBE MOD SED  12/26/2017  . IR IMAGING GUIDED PORT INSERTION  12/26/2017  . TONSILLECTOMY AND ADENOIDECTOMY  1970  . WISDOM TOOTH EXTRACTION      There were no vitals filed for this visit.  Subjective Assessment - 05/06/18 1613    Subjective  I felt okay after last session. When I feel better I do more and wear myself out. Patient walked on the treadmill and not fatiqued after. Patient does not know how long he was on the treadmill.     Pertinent History  Diagnosis is right tonsil squamous cell carcinoma, stage I (cT2, cN1, cM0, p16+). Had TORS consult with [facility] surgeon, but decided against that. He has 35 treatments of radiation and 2 of 3 chemo treatments     Patient Stated Goals  want to build up my strength and  endurance    Currently in Pain?  No/denies    Multiple Pain Sites  No                       [facility] Adult PT Treatment/Exercise - 05/06/18 0001      Lumbar Exercises: Aerobic   Nustep  L1 increased 1 resistance level every 2 minutes, 8 min total ; seat #11; arm #11   while assessing patient     Knee/Hip Exercises: Standing   Lateral Step Up  Left;Right;1 set;5 sets    Forward Step Up  Right;Left;1 set;5 reps    Other Standing Knee Exercises  hip abduction/extension slider x10 reps each, x5 reps each without UE support ; then hip extension with slider 10 each leg      Knee/Hip Exercises: Seated   Sit to Sand  2 sets;10 reps;without UE support;Other (comment)   last set without UE; on foam      Knee/Hip Exercises: Supine   Bridges  Strengthening;1 set;20 reps    Bridges Limitations  6# on waist just below feeding tube      Knee/Hip Exercises: Sidelying   Hip ABduction  Both;Strengthening;2 sets;10 reps    Hip ABduction Limitations  cues  to decrease hip flexion      Shoulder Exercises: Supine   Horizontal ABduction  Strengthening;Both;10 reps;Theraband   2 sets   Theraband Level (Shoulder Horizontal ABduction)  Level 3 (Green)    Flexion  Right;Left;10 reps;Strengthening;Theraband   2 sets   Theraband Level (Shoulder Flexion)  Level 2 (Red)    Flexion Limitations  around bent knees    Diagonals  Strengthening;Both;10 reps    Theraband Level (Shoulder Diagonals)  Level 2 (Red)    Diagonals Limitations  x2 sets                   PT Long Term Goals - 04/30/18 1721      PT LONG TERM GOAL #1   Title  Pt with increase the # of repetitions of sit to stand in 30 seconds to 13 indicating and impovement in general strength     Baseline  21    Status  Achieved      PT LONG TERM GOAL #2   Title  Pt will decrease the time to do a normal TUG to < 7.9 seconds indicating an improvment in functional mobilty to age matched norms    Baseline  5.67 sec    Status   Achieved      PT LONG TERM GOAL #3   Title  Pt will be independent in a home exercise program for strength and endurance     Baseline  still working on increased endurance    Time  6    Period  Weeks    Status  On-going      PT LONG TERM GOAL #4   Title  Pt will be able to improve 6 MWT to 2000 feet or more due to improved endurance    Baseline  1760    Time  6    Period  Weeks    Status  New            Plan - 05/06/18 1620    Clinical Impression Statement  Patient was not fatiqued after therapy. Patient was using stronger theraband. Patient needed verbal cues with hip abduction in sidely to not lean hips backward. Patient did the treadmill over the weekend and did not know how long he was on it. Patient will benefit from skilled therapy to improve endurance and strength while monitoring for fatique or technique.     Rehab Potential  Excellent    Clinical Impairments Affecting Rehab Potential  port in right upper chest and feeding tube     PT Frequency  2x / week    PT Duration  6 weeks    PT Treatment/Interventions  Patient/family education;ADLs/Self Care Home Management;Therapeutic activities;Therapeutic exercise;Neuromuscular re-education;Manual techniques    PT Next Visit Plan  progress strength, endurance and balance, and core strength with caution due to feeding tube    PT Home Exercise Plan  Access Code: [email or web address]     Recommended Other Services  MD signed initial eval    Consulted and Agree with Plan of Care  Patient       Patient will benefit from skilled therapeutic intervention in order to improve the following deficits and impairments:  Other (comment), Decreased mobility, Decreased knowledge of precautions, Decreased endurance, Decreased range of motion  Visit Diagnosis: Muscle weakness (generalized)  Other symptoms and signs involving the musculoskeletal system     Problem List Patient Active Problem List   Diagnosis Date Noted  . Skin lesion of back  04/22/2018  .  Anemia due to antineoplastic chemotherapy 03/26/2018  . Tinnitus 02/06/2018  . Leukopenia due to antineoplastic chemotherapy ([place]) 01/30/2018  . Chemotherapy-induced thrombocytopenia 01/24/2018  . AKI (acute kidney injury) ([place]) 01/17/2018  . Chemotherapy-induced nausea 01/17/2018  . Dysgeusia 01/17/2018  . Malnutrition ([place]) 01/17/2018  . Mucositis due to chemotherapy 01/17/2018  . Port-A-Cath in place 01/09/2018  . Malignant neoplasm of tonsillar fossa ([facility]) 11/30/2017    Earlie Counts, PT 05/06/18 4:57 PM   [facility] [address], [place], [place], [address] Phone: [phone]   Fax:  [phone]  Name: Thomas Mccall MRN: [id number] Date of Birth: 1961/01/02

## 2018-05-06 NOTE — Therapy (Signed)
[facility] [address] [place], [place], [address] Phone: [phone]   Fax:  [phone]  Speech Language Pathology Treatment  Patient Details  Name: Thomas Mccall MRN: [id number] Date of Birth: [birth year] Referring Provider (SLP): Eppie Gibson, MD   Encounter Date: 05/06/2018  End of Session - 05/06/18 1601    Visit Number  4    Number of Visits  7    Date for SLP Re-Evaluation  06/11/18    SLP Start Time  6734    SLP Stop Time   1535    SLP Time Calculation (min)  44 min    Activity Tolerance  Patient tolerated treatment well       Past Medical History:  Diagnosis Date  . Headache   . Mass    right tonsil  . Wears glasses     Past Surgical History:  Procedure Laterality Date  . DIRECT LARYNGOSCOPY Right 11/12/2017   Procedure: DIRECT LARYNGOSCOPY;  Surgeon: Melissa Montane, MD;  Location: [place];  Service: ENT;  Laterality: Right;  Direct laryngoscopy and biopy of right tonsil mass  . IR GASTROSTOMY TUBE MOD SED  12/26/2017  . IR IMAGING GUIDED PORT INSERTION  12/26/2017  . TONSILLECTOMY AND ADENOIDECTOMY  1970  . WISDOM TOOTH EXTRACTION      There were no vitals filed for this visit.  Subjective Assessment - 05/06/18 1456    Subjective  "I wake up in teh morning with a super dry mouth."    Currently in Pain?  No/denies            ADULT SLP TREATMENT - 05/06/18 1459      General Information   Behavior/Cognition  Alert;Cooperative;Pleasant mood      Treatment Provided   Treatment provided  Dysphagia      Dysphagia Treatment   Temperature Spikes Noted  No    Respiratory Status  Room air    Oral Cavity - Dentition  Adequate natural dentition   two molars missing on rt, one on lt   Treatment Methods  Skilled observation;Therapeutic exercise;Patient/caregiver education    Patient observed directly with PO's  Yes    Type of PO's observed  Dysphagia 3 (soft);Thin liquids    Oral Phase  Signs & Symptoms  Prolonged mastication    Pharyngeal Phase Signs & Symptoms  --   none noted   Other treatment/comments  "The good news is that the taste is back" SLP suggested humidifier and incr H2O as pt c/o dry mouth in AMs and by 3pm. Pt states he is not hungry. Pt largely eating soups and shakes due to what pt describes as xerostomia, lack of appetite, difficulty chewing, and "fear". SLP provided pt small strips of sliced [place] with mustard adn mayo and pt without overt s/s aspiration/difficulty today. Enocuraged pt to try some different kinds of solid foods, staying away from breats and most starches. Suggested he talk with dietician about how to swap calories from tube feed for PO, and about types of softer foods (dys III) to eat that would benefit him most nutritionally. Pt req'd min A rarely for HEP.      Assessment / Recommendations / Plan   Plan  Continue with current plan of care      Dysphagia Recommendations   Diet recommendations  --   dys III, thin   Liquids provided via  Cup    Medication Administration  --   as tolerated   Compensations  --  spontaneously was alternating food/liqiud     Progression Toward Goals   Progression toward goals  Progressing toward goals       SLP Education - 05/06/18 1600    Education Details  eat POs instead of tube feeds, need more solid POs instead of liquid/s, HEP procedure (min A), food journal    Person(s) Educated  Patient    Methods  Explanation;Demonstration;Verbal cues    Comprehension  Verbalized understanding;Returned demonstration;Verbal cues required       SLP Short Term Goals - 05/06/18 1554      SLP SHORT TERM GOAL #1   Title  pt will complete HEP with rare min A over two sessions    Status  Achieved      SLP SHORT TERM GOAL #2   Title  pt will tell SLP why he is completing HEP     Status  Achieved      SLP SHORT TERM GOAL #3   Title  pt will tell SLP how a food journal can foster a quicker return to a  more-normalized diet    Status  Achieved       SLP Long Term Goals - 05/06/18 1555      SLP LONG TERM GOAL #1   Title  pt will tell SLP 3 overt s/s of aspiration PNA with modified independence    Time  2    Period  --   visits   Status  On-going      SLP LONG TERM GOAL #2   Title  pt will complete HEP with modified independence over two sessions    Time  2    Period  --   visits   Status  On-going      SLP LONG TERM GOAL #3   Title  pt will tell SLP how to access information about life post-head/neck radiation with modified independence (classes, online info, etc)    Time  4    Period  --   visits   Status  On-going       Plan - 05/06/18 1601    Clinical Impression Statement  Pt complains of neck pain today, is completing HEP with incr'd frequency over previous session. Pt with oropharyngeal swallowing continuing as essentially WNL with sips H2O and dys III diet. Encouraged pt to incr H2O intake and POs. SLP suggested follow up with dietician. Probability of swallowing difficulty persists with the initiation of chemo and radiation therapy. Pt will need to cont to be followed by SLP for regular assessment of accurate HEP completion as well as for safety with POs both during and following treatment/s. It pt HEP looks good next session may decr to once/every other month    Speech Therapy Frequency  --   approx once every 4 weeks   Duration  --   7 total visits   Treatment/Interventions  Aspiration precaution training;Pharyngeal strengthening exercises;Diet toleration management by SLP;Trials of upgraded texture/liquids;Internal/external aids;Patient/family education;Compensatory strategies;SLP instruction and feedback;Cueing hierarchy;Environmental controls    Potential to Achieve Goals  Good    Potential Considerations  --   anxiety(?)   SLP Home Exercise Plan  provided today    Consulted and Agree with Plan of Care  Patient       Patient will benefit from skilled  therapeutic intervention in order to improve the following deficits and impairments:   Dysphagia, unspecified type    Problem List Patient Active Problem List   Diagnosis Date Noted  . Skin lesion  of back 04/22/2018  . Anemia due to antineoplastic chemotherapy 03/26/2018  . Tinnitus 02/06/2018  . Leukopenia due to antineoplastic chemotherapy ([facility]) 01/30/2018  . Chemotherapy-induced thrombocytopenia 01/24/2018  . AKI (acute kidney injury) ([place]) 01/17/2018  . Chemotherapy-induced nausea 01/17/2018  . Dysgeusia 01/17/2018  . Malnutrition ([facility]) 01/17/2018  . Mucositis due to chemotherapy 01/17/2018  . Port-A-Cath in place 01/09/2018  . Malignant neoplasm of tonsillar fossa ([facility]) 11/30/2017    [place] ,[place], [place]  05/06/2018, 4:04 PM  [facility] [address] [place], [place], [address] Phone: [phone]   Fax:  [phone]   Name: Thomas Mccall MRN: [id number] Date of Birth: May 19, 1960

## 2018-05-07 ENCOUNTER — Telehealth: Payer: Self-pay | Admitting: *Deleted

## 2018-05-07 ENCOUNTER — Ambulatory Visit: Payer: Self-pay

## 2018-05-07 ENCOUNTER — Other Ambulatory Visit: Payer: Self-pay | Admitting: Radiology

## 2018-05-07 NOTE — Telephone Encounter (Signed)
Patient called questioning if he should have his biospy of the lesion on his back before he has his PET scan.  Per Dr Maylon Peppers he can postpone the biopsy and proceed with the PET Scan first and if it shows anything more then he will need to have it biospy rescheduled.  Patient aware and has no further questions.  Biopsy canceled.

## 2018-05-08 ENCOUNTER — Ambulatory Visit: Payer: 59 | Admitting: Physical Therapy

## 2018-05-08 ENCOUNTER — Encounter: Payer: Self-pay | Admitting: Physical Therapy

## 2018-05-08 DIAGNOSIS — R1310 Dysphagia, unspecified: Secondary | ICD-10-CM | POA: Diagnosis not present

## 2018-05-08 DIAGNOSIS — R29898 Other symptoms and signs involving the musculoskeletal system: Secondary | ICD-10-CM | POA: Diagnosis not present

## 2018-05-08 DIAGNOSIS — M6281 Muscle weakness (generalized): Secondary | ICD-10-CM | POA: Diagnosis not present

## 2018-05-08 NOTE — Therapy (Signed)
[facility] [address] [place], [place], [address] Phone: [phone]   Fax:  [phone]  Physical Therapy Treatment  Patient Details  Name: Thomas Mccall MRN: [id number] Date of Birth: 1960-12-16 Referring Provider (PT): Dr. Eppie Gibson   Encounter Date: 05/08/2018  PT End of Session - 05/08/18 1621    Visit Number  13    Date for PT Re-Evaluation  05/29/18    Authorization Type  [employer]- 23 visit per year    PT Start Time  1618    PT Stop Time  1650    PT Time Calculation (min)  32 min    Activity Tolerance  Patient tolerated treatment well    Behavior During Therapy  [facility] for tasks assessed/performed       Past Medical History:  Diagnosis Date  . Headache   . Mass    right tonsil  . Wears glasses     Past Surgical History:  Procedure Laterality Date  . DIRECT LARYNGOSCOPY Right 11/12/2017   Procedure: DIRECT LARYNGOSCOPY;  Surgeon: Melissa Montane, MD;  Location: [facility];  Service: ENT;  Laterality: Right;  Direct laryngoscopy and biopy of right tonsil mass  . IR GASTROSTOMY TUBE MOD SED  12/26/2017  . IR IMAGING GUIDED PORT INSERTION  12/26/2017  . TONSILLECTOMY AND ADENOIDECTOMY  1970  . WISDOM TOOTH EXTRACTION      There were no vitals filed for this visit.  Subjective Assessment - 05/08/18 1623    Subjective  I feel ok this afternoon.    Pertinent History  Diagnosis is right tonsil squamous cell carcinoma, stage I (cT2, cN1, cM0, p16+). Had TORS consult with [facility] surgeon, but decided against that. He has 35 treatments of radiation and 2 of 3 chemo treatments     Currently in Pain?  No/denies    Multiple Pain Sites  No                       [facility] Adult PT Treatment/Exercise - 05/08/18 0001      Lumbar Exercises: Aerobic   Nustep  L1 x 10 min   Pt wanted to keep at level 1 today.      Knee/Hip Exercises: Machines for Strengthening   Cybex Leg Press  Seat 7 Bil 50# 20x       Knee/Hip Exercises: Standing   Lateral Step Up  Both;1 set;10 reps;Hand Hold: 1;Step Height: 6"    Forward Step Up  Both;1 set;10 reps;Hand Hold: 1;Step Height: 6"      Knee/Hip Exercises: Seated   Sit to Sand  1 set;15 reps;without UE support      Knee/Hip Exercises: Sidelying   Hip ABduction  Strengthening;Both;1 set;10 reps    Hip ABduction Limitations  yellow band at ankles   VC/TC for technique      Shoulder Exercises: Supine   Horizontal ABduction  Strengthening;Both;10 reps;Theraband   2 sets   Theraband Level (Shoulder Horizontal ABduction)  Level 3 (Green)    Diagonals  Strengthening;Both;10 reps;Theraband    Theraband Level (Shoulder Diagonals)  Level 3 (Green)      Shoulder Exercises: ROM/Strengthening   Lat Pull  --   20x 2 plates   Cybex Row  --   3 plates 20x                 PT Long Term Goals - 04/30/18 1721      PT LONG TERM GOAL #1  Title  Pt with increase the # of repetitions of sit to stand in 30 seconds to 13 indicating and impovement in general strength     Baseline  21    Status  Achieved      PT LONG TERM GOAL #2   Title  Pt will decrease the time to do a normal TUG to < 7.9 seconds indicating an improvment in functional mobilty to age matched norms    Baseline  5.67 sec    Status  Achieved      PT LONG TERM GOAL #3   Title  Pt will be independent in a home exercise program for strength and endurance     Baseline  still working on increased endurance    Time  6    Period  Weeks    Status  On-going      PT LONG TERM GOAL #4   Title  Pt will be able to improve 6 MWT to 2000 feet or more due to improved endurance    Baseline  1760    Time  6    Period  Weeks    Status  New            Plan - 05/08/18 1622    Clinical Impression Statement  Pt felt last volume of exercises was just enough to not exhaust him so we agreed to keep the volume about the same today. Added some gym equipment into the program today working on medium  resistance but high reps for his endurance. He did exceptionally well with the exercises. Shorter treatment today only because pt did do more resistive work and still felt good at 30 minute mark.     Rehab Potential  Excellent    Clinical Impairments Affecting Rehab Potential  port in right upper chest and feeding tube     PT Frequency  2x / week    PT Duration  6 weeks    PT Treatment/Interventions  Patient/family education;ADLs/Self Care Home Management;Therapeutic activities;Therapeutic exercise;Neuromuscular re-education;Manual techniques    PT Next Visit Plan  progress strength, endurance and balance, and core strength with caution due to feeding tube    PT Home Exercise Plan  Access Code: [email or web address]     Consulted and Agree with Plan of Care  Patient       Patient will benefit from skilled therapeutic intervention in order to improve the following deficits and impairments:  Other (comment), Decreased mobility, Decreased knowledge of precautions, Decreased endurance, Decreased range of motion  Visit Diagnosis: Muscle weakness (generalized)  Other symptoms and signs involving the musculoskeletal system     Problem List Patient Active Problem List   Diagnosis Date Noted  . Skin lesion of back 04/22/2018  . Anemia due to antineoplastic chemotherapy 03/26/2018  . Tinnitus 02/06/2018  . Leukopenia due to antineoplastic chemotherapy ([place]) 01/30/2018  . Chemotherapy-induced thrombocytopenia 01/24/2018  . AKI (acute kidney injury) ([place]) 01/17/2018  . Chemotherapy-induced nausea 01/17/2018  . Dysgeusia 01/17/2018  . Malnutrition ([place]) 01/17/2018  . Mucositis due to chemotherapy 01/17/2018  . Port-A-Cath in place 01/09/2018  . Malignant neoplasm of tonsillar fossa ([place]) 11/30/2017    Emre Stock, PTA  05/08/2018, 4:52 PM  [facility] [address] [place], [place], [address] Phone: [phone]   Fax:   [phone]  Name: Victoriano Campion MRN: [id number] Date of Birth: 1960/11/16

## 2018-05-09 ENCOUNTER — Ambulatory Visit (HOSPITAL_COMMUNITY): Payer: 59

## 2018-05-09 ENCOUNTER — Encounter (HOSPITAL_COMMUNITY): Payer: Self-pay

## 2018-05-13 ENCOUNTER — Ambulatory Visit: Payer: 59 | Admitting: Physical Therapy

## 2018-05-13 ENCOUNTER — Encounter: Payer: Self-pay | Admitting: Physical Therapy

## 2018-05-13 DIAGNOSIS — R1310 Dysphagia, unspecified: Secondary | ICD-10-CM | POA: Diagnosis not present

## 2018-05-13 DIAGNOSIS — M6281 Muscle weakness (generalized): Secondary | ICD-10-CM | POA: Diagnosis not present

## 2018-05-13 DIAGNOSIS — R29898 Other symptoms and signs involving the musculoskeletal system: Secondary | ICD-10-CM

## 2018-05-13 NOTE — Therapy (Signed)
[facility] [address], [place], [place], [address] Phone: [phone]   Fax:  [phone]  Physical Therapy Treatment  Patient Details  Name: Thomas Mccall MRN: [id number] Date of Birth: 11/09/1960 Referring Provider (PT): Dr. Eppie Mccall   Encounter Date: 05/13/2018  PT End of Session - 05/13/18 1605    Visit Number  14    Date for PT Re-Evaluation  05/29/18    Authorization - Visit Number  11    Authorization - Number of Visits  23    PT Start Time  0814    PT Stop Time  4818    PT Time Calculation (min)  40 min    Activity Tolerance  Patient tolerated treatment well    Behavior During Therapy  Thomas Mccall for tasks assessed/performed       Past Medical History:  Diagnosis Date  . Headache   . Mass    right tonsil  . Wears glasses     Past Surgical History:  Procedure Laterality Date  . DIRECT LARYNGOSCOPY Right 11/12/2017   Procedure: DIRECT LARYNGOSCOPY;  Surgeon: Thomas Montane, MD;  Location: [facility];  Service: ENT;  Laterality: Right;  Direct laryngoscopy and biopy of right tonsil mass  . IR GASTROSTOMY TUBE MOD SED  12/26/2017  . IR IMAGING GUIDED PORT INSERTION  12/26/2017  . TONSILLECTOMY AND ADENOIDECTOMY  1970  . WISDOM TOOTH EXTRACTION      There were no vitals filed for this visit.  Subjective Assessment - 05/13/18 1707    Subjective  No complaints.  I haven't had any exteme fatigue after PT lately.  I have been walking on the treadmill at home for about 20 minutes or until I get tired.    Patient Stated Goals  want to build up my strength and endurance    Currently in Pain?  No/denies                       [facility] Adult PT Treatment/Exercise - 05/13/18 0001      Lumbar Exercises: Aerobic   Nustep  L1 x 4 min, L3 x 4 min      Knee/Hip Exercises: Machines for Strengthening   Cybex Leg Press  Seat 7 Bil 60# 20x      Knee/Hip Exercises: Standing   Hip Flexion   Stengthening;Right;Left;10 reps;Knee bent   tapping step with 4lb ankle weight   Lateral Step Up  Right;Left;10 reps;Hand Hold: 1;Step Height: 6"   10x with slow lowering   Step Down  Right;Left;10 reps;Step Height: 6";Hand Hold: 1   tapping black foam mat   SLS with Vectors  standing on blue foam - opposite LE 3 ways - 5reps each      Knee/Hip Exercises: Seated   Long Arc Quad  Strengthening;Both;1 set;15 reps;Weights    Long Arc Quad Weight  4 lbs.      Knee/Hip Exercises: Supine   Bridges  Strengthening;1 set;20 reps    Bridges Limitations  6# on waist just below feeding tube      Shoulder Exercises: Supine   Horizontal ABduction  Strengthening;Both;Theraband;20 reps    Theraband Level (Shoulder Horizontal ABduction)  Level 3 (Green)    External Rotation  Strengthening;Right;Left;20 reps;Theraband    Theraband Level (Shoulder External Rotation)  Level 3 (Green)    Diagonals  Strengthening;Both;10 reps;Theraband    Theraband Level (Shoulder Diagonals)  Level 3 (Green)      Shoulder Exercises: ROM/Strengthening  Other ROM/Strengthening Exercises  corner stretch    Other ROM/Strengthening Exercises  open book supine with       Shoulder Exercises: Power [employer]  20 reps   20lb                 PT Long Term Goals - 04/30/18 1721      PT LONG TERM GOAL #1   Title  Pt with increase the # of repetitions of sit to stand in 30 seconds to 13 indicating and impovement in general strength     Baseline  21    Status  Achieved      PT LONG TERM GOAL #2   Title  Pt will decrease the time to do a normal TUG to < 7.9 seconds indicating an improvment in functional mobilty to age matched norms    Baseline  5.67 sec    Status  Achieved      PT LONG TERM GOAL #3   Title  Pt will be independent in a home exercise program for strength and endurance     Baseline  still working on increased endurance    Time  6    Period  Weeks    Status  On-going      PT LONG TERM GOAL  #4   Title  Pt will be able to improve 6 MWT to 2000 feet or more due to improved endurance    Baseline  1760    Time  6    Period  Weeks    Status  New            Plan - 05/13/18 1702    Clinical Impression Statement  Pt did not feel too exhausted and has been able to progress resistance and exercises.  He has not had an episode of extreme exhaustion after PT lately.  Pt has very rounded shoulder and feels tightenss across his chest.  He responded well to exercises and stretches today.  Pt will benefit from skilled PT to porgress overall strength and endurance.    PT Treatment/Interventions  Patient/family education;ADLs/Self Care Home Management;Therapeutic activities;Therapeutic exercise;Neuromuscular re-education;Manual techniques    PT Next Visit Plan  re-assess 6 MWT for goals, progress strength, endurance and balance, and core strength with caution due to feeding tube    Recommended Other Services  MD signed re-cert 11/16/54    Consulted and Agree with Plan of Care  Patient       Patient will benefit from skilled therapeutic intervention in order to improve the following deficits and impairments:  Other (comment), Decreased mobility, Decreased knowledge of precautions, Decreased endurance, Decreased range of motion  Visit Diagnosis: Muscle weakness (generalized)  Other symptoms and signs involving the musculoskeletal system     Problem List Patient Active Problem List   Diagnosis Date Noted  . Skin lesion of back 04/22/2018  . Anemia due to antineoplastic chemotherapy 03/26/2018  . Tinnitus 02/06/2018  . Leukopenia due to antineoplastic chemotherapy ([place]) 01/30/2018  . Chemotherapy-induced thrombocytopenia 01/24/2018  . AKI (acute kidney injury) ([place]) 01/17/2018  . Chemotherapy-induced nausea 01/17/2018  . Dysgeusia 01/17/2018  . Malnutrition ([place]) 01/17/2018  . Mucositis due to chemotherapy 01/17/2018  . Port-A-Cath in place 01/09/2018  . Malignant neoplasm of  tonsillar fossa ([place]) 11/30/2017    Thomas Mccall, PT 05/13/2018, 5:09 PM  [facility] [address] [place], [place], [address] Phone: [phone]   Fax:  [phone]  Name: Thomas Mccall  Thomas Mccall MRN: [id number] Date of Birth: 1961-03-04

## 2018-05-15 ENCOUNTER — Encounter (HOSPITAL_COMMUNITY)
Admission: RE | Admit: 2018-05-15 | Discharge: 2018-05-15 | Disposition: A | Discharge status: Home / Self Care | Payer: 59 | Source: Ambulatory Visit | Attending: Radiation Oncology | Admitting: Radiation Oncology

## 2018-05-15 ENCOUNTER — Ambulatory Visit: Payer: 59 | Admitting: Physical Therapy

## 2018-05-15 ENCOUNTER — Encounter: Payer: Self-pay | Admitting: Physical Therapy

## 2018-05-15 DIAGNOSIS — R1310 Dysphagia, unspecified: Secondary | ICD-10-CM | POA: Diagnosis not present

## 2018-05-15 DIAGNOSIS — C090 Malignant neoplasm of tonsillar fossa: Secondary | ICD-10-CM | POA: Diagnosis not present

## 2018-05-15 DIAGNOSIS — R29898 Other symptoms and signs involving the musculoskeletal system: Secondary | ICD-10-CM | POA: Diagnosis not present

## 2018-05-15 DIAGNOSIS — M6281 Muscle weakness (generalized): Secondary | ICD-10-CM

## 2018-05-15 LAB — GLUCOSE, CAPILLARY: Glucose-Capillary: 99 mg/dL (ref 70–99)

## 2018-05-15 MED ORDER — FLUDEOXYGLUCOSE F - 18 (FDG) INJECTION
7.0000 | Freq: Once | INTRAVENOUS | Status: AC
  Administered 2018-05-15: 7 via INTRAVENOUS

## 2018-05-15 NOTE — Therapy (Signed)
[facility] [address] [place], [place], [address] Phone: [phone]   Fax:  [phone]  Physical Therapy Treatment  Patient Details  Name: Thomas Mccall MRN: [id number] Date of Birth: 1960-09-21 Referring Provider (PT): Dr. Eppie Gibson   Encounter Date: 05/15/2018  PT End of Session - 05/15/18 1704    Visit Number  15    Date for PT Re-Evaluation  05/29/18    Authorization Type  [employer]- 23 visit per year    Authorization - Visit Number  12    Authorization - Number of Visits  23    PT Start Time  2952    PT Stop Time  1700    PT Time Calculation (min)  45 min    Activity Tolerance  Patient tolerated treatment well    Behavior During Therapy  [facility] for tasks assessed/performed       Past Medical History:  Diagnosis Date  . Headache   . Mass    right tonsil  . Wears glasses     Past Surgical History:  Procedure Laterality Date  . DIRECT LARYNGOSCOPY Right 11/12/2017   Procedure: DIRECT LARYNGOSCOPY;  Surgeon: Melissa Montane, MD;  Location: [place];  Service: ENT;  Laterality: Right;  Direct laryngoscopy and biopy of right tonsil mass  . IR GASTROSTOMY TUBE MOD SED  12/26/2017  . IR IMAGING GUIDED PORT INSERTION  12/26/2017  . TONSILLECTOMY AND ADENOIDECTOMY  1970  . WISDOM TOOTH EXTRACTION      There were no vitals filed for this visit.  Subjective Assessment - 05/15/18 1616    Subjective  Today I had my PET scan. My fatique level is good.     Pertinent History  Diagnosis is right tonsil squamous cell carcinoma, stage I (cT2, cN1, cM0, p16+). Had TORS consult with [facility] surgeon, but decided against that. He has 35 treatments of radiation and 2 of 3 chemo treatments     Patient Stated Goals  want to build up my strength and endurance    Currently in Pain?  No/denies                       [facility] Adult PT Treatment/Exercise - 05/15/18 0001      Lumbar Exercises: Aerobic   Nustep  L1 x 4  min, L3 x 4 min      Knee/Hip Exercises: Machines for Strengthening   Cybex Leg Press  Seat 7 Bil 60# 20x      Knee/Hip Exercises: Standing   Hip Flexion  Stengthening;Right;Left;10 reps;Knee bent   tapping step with 4lb ankle weight   Lateral Step Up  Right;Left;10 reps;Hand Hold: 1;Step Height: 6"   10x with slow lowering   Lateral Step Up Limitations  4# on ankles    Step Down  Right;Left;10 reps;Step Height: 6";Hand Hold: 1   tapping black foam mat   SLS with Vectors  standing on blue foam - opposite LE 3 ways - 10reps each finger tip holding on      Knee/Hip Exercises: Seated   Long Arc Quad  Strengthening;Both;1 set;15 reps;Weights    Long Arc Quad Weight  4 lbs.      Shoulder Exercises: Supine   Horizontal ABduction  Strengthening;Both;Theraband;20 reps    Theraband Level (Shoulder Horizontal ABduction)  Level 3 (Green)    Diagonals  Strengthening;Both;10 reps;Theraband    Theraband Level (Shoulder Diagonals)  Level 3 (Green)      Shoulder Exercises: Power  [employer]  20 reps   20lb                 PT Long Term Goals - 04/30/18 1721      PT LONG TERM GOAL #1   Title  Pt with increase the # of repetitions of sit to stand in 30 seconds to 13 indicating and impovement in general strength     Baseline  21    Status  Achieved      PT LONG TERM GOAL #2   Title  Pt will decrease the time to do a normal TUG to < 7.9 seconds indicating an improvment in functional mobilty to age matched norms    Baseline  5.67 sec    Status  Achieved      PT LONG TERM GOAL #3   Title  Pt will be independent in a home exercise program for strength and endurance     Baseline  still working on increased endurance    Time  6    Period  Weeks    Status  On-going      PT LONG TERM GOAL #4   Title  Pt will be able to improve 6 MWT to 2000 feet or more due to improved endurance    Baseline  1760    Time  6    Period  Weeks    Status  New            Plan - 05/15/18 1705     Clinical Impression Statement  Patient needed verbal cues to bring scapula together with rows. Patient would go fast with step downs and need VC to go slowly. Patient was a little fatique due to not sleeping well from worrying about his PET scan. Patietn will benefit from skilled PT to progress overall strength and endurance.     Rehab Potential  Excellent    Clinical Impairments Affecting Rehab Potential  port in right upper chest and feeding tube     PT Frequency  2x / week    PT Duration  6 weeks    PT Treatment/Interventions  Patient/family education;ADLs/Self Care Home Management;Therapeutic activities;Therapeutic exercise;Neuromuscular re-education;Manual techniques    PT Next Visit Plan  re-assess 6 MWT for goals, progress strength, endurance and balance, and core strength with caution due to feeding tube; see what PET scan showed    PT Home Exercise Plan  Access Code: [email or web address]     Consulted and Agree with Plan of Care  Patient       Patient will benefit from skilled therapeutic intervention in order to improve the following deficits and impairments:  Other (comment), Decreased mobility, Decreased knowledge of precautions, Decreased endurance, Decreased range of motion  Visit Diagnosis: Muscle weakness (generalized)  Other symptoms and signs involving the musculoskeletal system     Problem List Patient Active Problem List   Diagnosis Date Noted  . Skin lesion of back 04/22/2018  . Anemia due to antineoplastic chemotherapy 03/26/2018  . Tinnitus 02/06/2018  . Leukopenia due to antineoplastic chemotherapy ([place]) 01/30/2018  . Chemotherapy-induced thrombocytopenia 01/24/2018  . AKI (acute kidney injury) ([place]) 01/17/2018  . Chemotherapy-induced nausea 01/17/2018  . Dysgeusia 01/17/2018  . Malnutrition ([place]) 01/17/2018  . Mucositis due to chemotherapy 01/17/2018  . Port-A-Cath in place 01/09/2018  . Malignant neoplasm of tonsillar fossa ([facility]) 11/30/2017    Earlie Counts,  PT 05/15/18 5:07 PM   [facility] [address]  [place], [place], [address] Phone: [phone]   Fax:  [phone]  Name: Thomas Mccall MRN: [id number] Date of Birth: 14-Aug-1960

## 2018-05-16 ENCOUNTER — Encounter: Payer: Self-pay | Admitting: Radiation Oncology

## 2018-05-16 NOTE — Progress Notes (Signed)
Thomas Mccall presents for follow up of radiation completed 02/13/18 to his right tonsil and bilateral neck.   Pain issues, if any: He denies Using a feeding tube?: Yes, he is instilling 5 cartons of osmolite daily.  Weight changes, if any:  Wt Readings from Last 3 Encounters:  05/21/18 152 lb 12.8 oz (69.3 kg)  04/22/18 152 lb (68.9 kg)  04/16/18 151 lb 8 oz (68.7 kg)   Swallowing issues, if any: He is eating pudding, soups and other liquids.  Smoking or chewing tobacco? No Using fluoride trays daily? He is using prevident toothpaste.  Last ENT visit was on: He saw Dr. Constance Holster on 04/05/18. Other notable issues, if any:  PET 05/15/18 Dr. Maylon Peppers 05/22/18  BP 110/80 (BP Location: Right Arm, Patient Position: Sitting)   Pulse 80   Temp 98.1 F (36.7 C) (Oral)   Resp 18   Ht 5\' 10"  (1.778 m)   Wt 152 lb 12.8 oz (69.3 kg)   SpO2 98%   BMI 21.92 kg/m

## 2018-05-20 ENCOUNTER — Ambulatory Visit: Payer: 59 | Attending: Radiation Oncology | Admitting: Physical Therapy

## 2018-05-20 DIAGNOSIS — M6281 Muscle weakness (generalized): Secondary | ICD-10-CM | POA: Insufficient documentation

## 2018-05-20 DIAGNOSIS — R29898 Other symptoms and signs involving the musculoskeletal system: Secondary | ICD-10-CM | POA: Diagnosis present

## 2018-05-20 NOTE — Therapy (Signed)
[facility] [address], [place], [place], [address] Phone: [phone]   Fax:  [phone]  Physical Therapy Treatment  Patient Details  Name: Thomas Mccall MRN: [id number] Date of Birth: [birth year] Referring Provider (PT): Dr. Eppie Gibson   Encounter Date: 05/20/2018  PT End of Session - 05/20/18 1658    Visit Number  16    Date for PT Re-Evaluation  05/29/18    Authorization Type  [employer]- 23 visit per year    PT Start Time  1617    PT Stop Time  1655    PT Time Calculation (min)  38 min    Activity Tolerance  Patient tolerated treatment well    Behavior During Therapy  [facility] for tasks assessed/performed       Past Medical History:  Diagnosis Date  . Headache   . History of radiation therapy 12/27/17- 02/13/18   right tonsil and bilateral neck.   . Mass    right tonsil  . Wears glasses     Past Surgical History:  Procedure Laterality Date  . DIRECT LARYNGOSCOPY Right 11/12/2017   Procedure: DIRECT LARYNGOSCOPY;  Surgeon: Melissa Montane, MD;  Location: [place];  Service: ENT;  Laterality: Right;  Direct laryngoscopy and biopy of right tonsil mass  . IR GASTROSTOMY TUBE MOD SED  12/26/2017  . IR IMAGING GUIDED PORT INSERTION  12/26/2017  . TONSILLECTOMY AND ADENOIDECTOMY  1970  . WISDOM TOOTH EXTRACTION      There were no vitals filed for this visit.  Subjective Assessment - 05/20/18 1708    Subjective  I found out I am cancer free, I got the result back on my PET scan.  My fatigue level is average.    Patient Stated Goals  want to build up my strength and endurance    Currently in Pain?  No/denies                       [facility] Adult PT Treatment/Exercise - 05/20/18 0001      Lumbar Exercises: Aerobic   Nustep  L1 increased 1 resistance level every 2 minutes, 8 min total ; seat #11; arm #11   while assessing patient     Knee/Hip Exercises: Machines for Strengthening   Cybex Leg Press   Seat 7 Bil 60# 20x      Knee/Hip Exercises: Standing   Knee Flexion  Left;Right;Strengthening;2 sets;10 reps    Knee Flexion Limitations  3#    Hip Flexion  Stengthening;Right;Left;10 reps;Knee bent;2 sets   tapping step with 4lb ankle weight   Hip Abduction  Stengthening;Right;Left;2 sets;10 reps;Knee bent   tapping #4   Step Down  Right;Left;10 reps;Step Height: 6";Hand Hold: 1   tapping black foam mat   SLS with Vectors  standing on blue foam - opposite LE 3 ways - 10reps each finger tip holding on      Knee/Hip Exercises: Supine   Bridges  Strengthening;2 sets;10 reps    Bridges Limitations  blue band around knees      Knee/Hip Exercises: Sidelying   Hip ABduction  Strengthening;Both;1 set;15 reps                  PT Long Term Goals - 04/30/18 1721      PT LONG TERM GOAL #1   Title  Pt with increase the # of repetitions of sit to stand in 30 seconds to 13 indicating and impovement in general strength  Baseline  21    Status  Achieved      PT LONG TERM GOAL #2   Title  Pt will decrease the time to do a normal TUG to < 7.9 seconds indicating an improvment in functional mobilty to age matched norms    Baseline  5.67 sec    Status  Achieved      PT LONG TERM GOAL #3   Title  Pt will be independent in a home exercise program for strength and endurance     Baseline  still working on increased endurance    Time  6    Period  Weeks    Status  On-going      PT LONG TERM GOAL #4   Title  Pt will be able to improve 6 MWT to 2000 feet or more due to improved endurance    Baseline  1760    Time  6    Period  Weeks    Status  New            Plan - 05/20/18 1703    Clinical Impression Statement  Pt was feeling pretty good today.  He tolerated all exercises well and was able to increase his endurance and difficulty level on nustep today.  Pt will continue to benefit from skilled PT for overall strength and endurance.     PT Treatment/Interventions   Patient/family education;ADLs/Self Care Home Management;Therapeutic activities;Therapeutic exercise;Neuromuscular re-education;Manual techniques    PT Next Visit Plan  re-assess 6 MWT for goals, progress strength, endurance and balance, and core strength with caution due to feeding tube    PT Home Exercise Plan  Access Code: [email or web address]     Consulted and Agree with Plan of Care  Patient       Patient will benefit from skilled therapeutic intervention in order to improve the following deficits and impairments:  Other (comment), Decreased mobility, Decreased knowledge of precautions, Decreased endurance, Decreased range of motion  Visit Diagnosis: Muscle weakness (generalized)  Other symptoms and signs involving the musculoskeletal system     Problem List Patient Active Problem List   Diagnosis Date Noted  . Skin lesion of back 04/22/2018  . Anemia due to antineoplastic chemotherapy 03/26/2018  . Tinnitus 02/06/2018  . Leukopenia due to antineoplastic chemotherapy ([place]) 01/30/2018  . Chemotherapy-induced thrombocytopenia 01/24/2018  . AKI (acute kidney injury) ([place]) 01/17/2018  . Chemotherapy-induced nausea 01/17/2018  . Dysgeusia 01/17/2018  . Malnutrition ([place]) 01/17/2018  . Mucositis due to chemotherapy 01/17/2018  . Port-A-Cath in place 01/09/2018  . Malignant neoplasm of tonsillar fossa ([place]) 11/30/2017    Jule Ser, PT 05/20/2018, 5:09 PM  [facility] [address], [place], [place], [address] Phone: [phone]   Fax:  [phone]  Name: Esteven Overfelt MRN: [id number] Date of Birth: 1960-10-26

## 2018-05-21 ENCOUNTER — Encounter: Payer: Self-pay | Admitting: *Deleted

## 2018-05-21 ENCOUNTER — Other Ambulatory Visit: Payer: Self-pay

## 2018-05-21 ENCOUNTER — Encounter: Payer: Self-pay | Admitting: Radiation Oncology

## 2018-05-21 ENCOUNTER — Ambulatory Visit
Admission: RE | Admit: 2018-05-21 | Discharge: 2018-05-21 | Disposition: A | Discharge status: Home / Self Care | Payer: 59 | Source: Ambulatory Visit | Attending: Radiation Oncology | Admitting: Radiation Oncology

## 2018-05-21 DIAGNOSIS — Z85818 Personal history of malignant neoplasm of other sites of lip, oral cavity, and pharynx: Secondary | ICD-10-CM | POA: Diagnosis not present

## 2018-05-21 DIAGNOSIS — N400 Enlarged prostate without lower urinary tract symptoms: Secondary | ICD-10-CM | POA: Insufficient documentation

## 2018-05-21 DIAGNOSIS — Z931 Gastrostomy status: Secondary | ICD-10-CM | POA: Diagnosis not present

## 2018-05-21 DIAGNOSIS — Z79899 Other long term (current) drug therapy: Secondary | ICD-10-CM | POA: Insufficient documentation

## 2018-05-21 DIAGNOSIS — C090 Malignant neoplasm of tonsillar fossa: Secondary | ICD-10-CM | POA: Diagnosis not present

## 2018-05-21 DIAGNOSIS — Z9989 Dependence on other enabling machines and devices: Secondary | ICD-10-CM | POA: Diagnosis not present

## 2018-05-21 DIAGNOSIS — R222 Localized swelling, mass and lump, trunk: Secondary | ICD-10-CM | POA: Diagnosis not present

## 2018-05-21 DIAGNOSIS — Z08 Encounter for follow-up examination after completed treatment for malignant neoplasm: Secondary | ICD-10-CM | POA: Diagnosis not present

## 2018-05-21 DIAGNOSIS — Z923 Personal history of irradiation: Secondary | ICD-10-CM | POA: Insufficient documentation

## 2018-05-21 HISTORY — DX: Personal history of irradiation: Z92.3

## 2018-05-21 NOTE — Progress Notes (Signed)
Radiation Oncology         [phone] ________________________________  Name: Thomas Mccall MRN: [id number]  Date: 05/21/2018  DOB: 21-Apr-1960  Follow-Up Visit Note  CC: [place], Betty G, MD  Melissa Montane, MD  Diagnosis and Prior Radiotherapy:       ICD-10-CM   1. Malignant neoplasm of tonsillar fossa ([place]) C09.0     CHIEF COMPLAINT:  Here for follow-up and surveillance of right tonsil cancer  Narrative:  The patient returns today for routine follow-up. He is accompanied by his wife.   Since his last visit to the office, he underwent a PET scan on 05/16/2018 that showed: Resolution of metabolic activity in the RIGHT hypopharynx /tonsil. Resolution of hypermetabolic cervical adenopathy on the RIGHT. No evidence metastatic cervical adenopathy in the neck. No evidence distant metastatic disease. Peg tube in good position    I have reviewed his images personally  He has returned to work part time. He is scheduled to follow up with Dr. Maylon Peppers soon.     Weight changes, if any: Wt Readings from Last 3 Encounters:  05/21/18 152 lb 12.8 oz (69.3 kg)  04/22/18 152 lb (68.9 kg)  04/16/18 151 lb 8 oz (68.7 kg)    Pain issues, if any: He denies Using a feeding tube?: Yes, he is instilling 5 cartons of osmolite daily.   Swallowing issues, if any: He is eating pudding, soups and other liquids.  Smoking or chewing tobacco? No Using fluoride trays daily? He is using prevident toothpaste.  Last ENT visit was on: He saw Dr. Constance Holster on 04/05/18.   ALLERGIES:  has No Known Allergies.  Meds: Current Outpatient Medications  Medication Sig Dispense Refill  . lidocaine-prilocaine (EMLA) cream Apply to affected area once 30 g 3  . Nutritional Supplements (FEEDING SUPPLEMENT, OSMOLITE 1.5 CAL,) LIQD Begin 1.5 bottles Osmolite 1.5 via PEG QID with 60 mL free water before and after bolus feedings. Mix 30 mL Promod with 60 mL water BID and put into PEG. Flush with 30 mL water. Do not mix Promod  with Osmolite 1.5 6 Bottle 0  . sodium fluoride (PREVIDENT) 1.1 % GEL dental gel Place 1 application onto teeth at bedtime. Applied to to fresh. Brush teeth at bedtime for 2 minutes. Spit out excess. Do not rinse afterwards.    . Morphine Sulfate (MORPHINE CONCENTRATE) 10 mg / 0.5 ml concentrated solution Place 10 mg into feeding tube every 3 (three) hours as needed for moderate pain or severe pain.    Marland Kitchen ondansetron (ZOFRAN) 8 MG tablet Take 8 mg by mouth every 8 (eight) hours as needed for nausea or vomiting.    . prochlorperazine (COMPAZINE) 10 MG tablet Take 1 tablet (10 mg total) by mouth every 6 (six) hours as needed (Nausea or vomiting). (Patient not taking: Reported on 05/21/2018) 30 tablet 1   No current facility-administered medications for this encounter.     Physical Findings: The patient is in no acute distress. Patient is alert and oriented. Wt Readings from Last 3 Encounters:  05/21/18 152 lb 12.8 oz (69.3 kg)  04/22/18 152 lb (68.9 kg)  04/16/18 151 lb 8 oz (68.7 kg)    height is 5\' 10"  (1.778 m) and weight is 152 lb 12.8 oz (69.3 kg). His oral temperature is 98.1 F (36.7 C). His blood pressure is 110/80 and his pulse is 80. His respiration is 18 and oxygen saturation is 98%. .  General: Alert and oriented, in no acute distress HEENT: Head  is normocephalic. Extraocular movements are intact. Brisk gag reflex. No tumor in upper throat. Neck: no palpable masses Skin: Skin in treatment fields shows satisfactory healing Heart: Regular in rate and rhythm with no murmurs, rubs, or gallops. Chest: Clear to auscultation bilaterally, with no rhonchi, wheezes, or rales. Lymphatics: see Neck Exam Psychiatric: Judgment and insight are intact. Affect is appropriate.   Lab Findings: Lab Results  Component Value Date   WBC 4.2 04/16/2018   HGB 11.8 (L) 04/16/2018   HCT 35.8 (L) 04/16/2018   MCV 91.1 04/16/2018   PLT 171 04/16/2018    Lab Results  Component Value Date   TSH 0.550  12/12/2017    Radiographic Findings: Nm Pet Image Restag (ps) Skull Base To Thigh  Result Date: 05/16/2018 CLINICAL DATA:  Initial treatment strategy for head neck cancer. Tonsil carcinoma. EXAM: NUCLEAR MEDICINE PET SKULL BASE TO THIGH TECHNIQUE: 7.0 mCi F-18 FDG was injected intravenously. Full-ring PET imaging was performed from the skull base to thigh after the radiotracer. CT data was obtained and used for attenuation correction and anatomic localization. Fasting blood glucose: 99 mg/dl COMPARISON:  PET-CT 11/28/2017 FINDINGS: Mediastinal blood pool activity: SUV max 2.1 NECK: Resolution hypermetabolic activity in the RIGHT lingual tonsil region. Resolution of metabolic activity and size of previously seen RIGHT level 2 lymph node. No residual hypermetabolic LEFT or RIGHT cervical lymph nodes. Incidental CT findings: none CHEST: No hypermetabolic mediastinal or hilar nodes. No suspicious pulmonary nodules on the CT scan. Incidental CT findings: Port in the anterior chest wall with tip in distal SVC. ABDOMEN/PELVIS: No abnormal hypermetabolic activity within the liver, pancreas, adrenal glands, or spleen. No hypermetabolic lymph nodes in the abdomen or pelvis. Incidental CT findings: Peg tube in place.  Prostate enlarged. SKELETON: No focal hypermetabolic activity to suggest skeletal metastasis. Incidental CT findings: none IMPRESSION: 1. Resolution of metabolic activity in the RIGHT hypopharynx / tonsil. 2. Resolution of hypermetabolic cervical adenopathy on the RIGHT. 3. No evidence metastatic cervical adenopathy in the neck. 4. No evidence distant metastatic disease. 5. Peg tube in good position. Electronically Signed   By: Suzy Bouchard M.D.   On: 05/16/2018 12:58   [place] Chest Soft Tissue  Result Date: 04/22/2018 CLINICAL DATA:  Initial evaluation for focal nodule at posterior back. History of head neck cancer, status post completion of chemo radiation. EXAM: ULTRASOUND OF HEAD/NECK SOFT TISSUES  TECHNIQUE: Ultrasound examination of the head and neck soft tissues was performed in the area of clinical concern. COMPARISON:  None. FINDINGS: Targeted ultrasound of a palpable abnormality of concern at the upper mid back just to the left of midline demonstrates a well-circumscribed hypoechoic cystic lesion measuring 7 x 6 x 7 mm. Mild internal complexity with possible trace vascularity. Lesion is positioned approximately 2-3 mm deep to the skin. A second smaller lesion with similar imaging characteristics measuring 4 x 2 x 3 mm seen just to the right of midline within the adjacent upper back. IMPRESSION: 1. 7 x 6 x 7 mm well-circumscribed mildly complex hypoechoic lesion within the subcutaneous fat at the left paramedian upper back. While this finding could simply reflect a small sebaceous cyst, imaging characteristics are nonspecific, with a possible neoplastic process not entirely excluded. Clinical follow-up to resolution recommended. Additionally, correlation with histology could be performed for further evaluation as warranted. Resolution 2. Additional second smaller 4 x 2 x 3 mm lesion with similar imaging characteristics within the right paramedian back adjacent to the above-described larger lesion. Again, follow-up to resolution  recommended. Electronically Signed   By: Jeannine Boga M.D.   On: 04/22/2018 23:51    Impression/Plan:  Right Tonsil Squamous Cell Carcinoma  1) Head and Neck Cancer Status: NED  2) Nutritional Status: discussed titration off PEG as tolerated PEG tube: yes   3) Swallowing: functional with soft food, Continue SLP  5) Dental: Encouraged to continue regular followup with dentistry, and dental hygiene including fluoride rinses.  Scheduled for check up soon  6) Thyroid function: labs before medical oncology, pending Lab Results  Component Value Date   TSH 0.550 12/12/2017    7)  Follow-up with medical oncology and ENT.  We will schedule his appt with Dr Constance Holster  in 53mo.  Although medical oncology will closely follow him as well, I will see him in a year to stay in the loop. The patient was encouraged to call with any issues or questions before then.  Recommending FYNN - Gayleen Orem will contact about scheduling.   I spent 25 minutes face to face with the patient and more than 50% of that time was spent in counseling and/or coordination of care.  _____________________________________   Eppie Gibson, MD  This document serves as a record of services personally performed by Eppie Gibson, MD. It was created on her behalf by Steva Colder, a trained medical scribe. The creation of this record is based on the scribe's personal observations and the provider's statements to them. This document has been checked and approved by the attending provider.

## 2018-05-21 NOTE — Progress Notes (Signed)
Oncology Nurse Navigator Documentation  Met with Mr. Shorey and his wife during scheduled 12-monthpost-tmt follow-up appt with Dr. SIsidore Moos   They voiced understanding of excellent results for re-staging PET which Dr. SIsidore Mooshad discussed by last Friday. He reported:  Still using PEG several times daily, trying to increase oral intake of food at evening meal.    Full return of all taste sensations at a heightened/intense level.  He arrived with stable weight since last visit.  Continued follow-ups with SLP CGlendell Docker continues to conduct HEP BID per Carl's guidance.  Seeing PCD 4/5 for routine check-up/cleaning. They voiced understanding future follow-up appts will alternate between Dr. ZMaylon Peppers Medical Oncology, and ENT Dr. RConstance Holster that I  will coordinate post-tmt follow-up with ENT Dr. RConstance Holsterin 3 months.  Interventions   Dr. SIsidore Moosencouraged reduction of Osmolite 1.5 1 can weekly to encourage goal of 100% oral intake of food.  I reiterated criteria for PEG removal of 100% oral intake and stable weight for a month.  I discussed upcoming [facility].  They were encouraged to attend by both Dr. SIsidore Moosand this navigator.    RGayleen Orem RN, BSN Head & Neck Oncology Nurse [phone]

## 2018-05-22 ENCOUNTER — Inpatient Hospital Stay: Payer: 59

## 2018-05-22 ENCOUNTER — Inpatient Hospital Stay: Payer: 59 | Attending: Hematology | Admitting: Hematology

## 2018-05-22 ENCOUNTER — Encounter: Payer: Self-pay | Admitting: Hematology

## 2018-05-22 ENCOUNTER — Telehealth: Payer: Self-pay | Admitting: Student

## 2018-05-22 DIAGNOSIS — D649 Anemia, unspecified: Secondary | ICD-10-CM | POA: Diagnosis not present

## 2018-05-22 DIAGNOSIS — D72819 Decreased white blood cell count, unspecified: Secondary | ICD-10-CM | POA: Diagnosis not present

## 2018-05-22 DIAGNOSIS — Z95828 Presence of other vascular implants and grafts: Secondary | ICD-10-CM

## 2018-05-22 DIAGNOSIS — E46 Unspecified protein-calorie malnutrition: Secondary | ICD-10-CM | POA: Diagnosis not present

## 2018-05-22 DIAGNOSIS — Z923 Personal history of irradiation: Secondary | ICD-10-CM | POA: Insufficient documentation

## 2018-05-22 DIAGNOSIS — C090 Malignant neoplasm of tonsillar fossa: Secondary | ICD-10-CM

## 2018-05-22 DIAGNOSIS — Z9221 Personal history of antineoplastic chemotherapy: Secondary | ICD-10-CM | POA: Diagnosis not present

## 2018-05-22 DIAGNOSIS — Z931 Gastrostomy status: Secondary | ICD-10-CM | POA: Diagnosis not present

## 2018-05-22 DIAGNOSIS — Z79899 Other long term (current) drug therapy: Secondary | ICD-10-CM | POA: Diagnosis not present

## 2018-05-22 LAB — CBC WITH DIFFERENTIAL (CANCER CENTER ONLY)
Abs Immature Granulocytes: 0.01 10*3/uL (ref 0.00–0.07)
Basophils Absolute: 0 10*3/uL (ref 0.0–0.1)
Basophils Relative: 0 %
Eosinophils Absolute: 0 10*3/uL (ref 0.0–0.5)
Eosinophils Relative: 1 %
HCT: 37.4 % — ABNORMAL LOW (ref 39.0–52.0)
Hemoglobin: 12.3 g/dL — ABNORMAL LOW (ref 13.0–17.0)
Immature Granulocytes: 0 %
Lymphocytes Relative: 10 %
Lymphs Abs: 0.3 10*3/uL — ABNORMAL LOW (ref 0.7–4.0)
MCH: 29.9 pg (ref 26.0–34.0)
MCHC: 32.9 g/dL (ref 30.0–36.0)
MCV: 91 fL (ref 80.0–100.0)
Monocytes Absolute: 0.3 10*3/uL (ref 0.1–1.0)
Monocytes Relative: 10 %
Neutro Abs: 2.4 10*3/uL (ref 1.7–7.7)
Neutrophils Relative %: 79 %
Platelet Count: 154 10*3/uL (ref 150–400)
RBC: 4.11 MIL/uL — ABNORMAL LOW (ref 4.22–5.81)
RDW: 11.3 % — ABNORMAL LOW (ref 11.5–15.5)
WBC Count: 3.1 10*3/uL — ABNORMAL LOW (ref 4.0–10.5)
nRBC: 0 % (ref 0.0–0.2)

## 2018-05-22 LAB — COMPREHENSIVE METABOLIC PANEL
ALT: 15 U/L (ref 0–44)
AST: 16 U/L (ref 15–41)
Albumin: 4.3 g/dL (ref 3.5–5.0)
Alkaline Phosphatase: 60 U/L (ref 38–126)
Anion gap: 6 (ref 5–15)
BUN: 19 mg/dL (ref 6–20)
CO2: 29 mmol/L (ref 22–32)
Calcium: 9.3 mg/dL (ref 8.9–10.3)
Chloride: 103 mmol/L (ref 98–111)
Creatinine, Ser: 0.92 mg/dL (ref 0.61–1.24)
GFR calc Af Amer: >60 mL/min (ref >60)
GFR calc non Af Amer: >60 mL/min (ref >60)
Glucose, Bld: 94 mg/dL (ref 70–99)
Potassium: 3.9 mmol/L (ref 3.5–5.1)
Sodium: 138 mmol/L (ref 135–145)
Total Bilirubin: 0.6 mg/dL (ref 0.3–1.2)
Total Protein: 7 g/dL (ref 6.5–8.1)

## 2018-05-22 LAB — T4, FREE: Free T4: 0.92 ng/dL (ref 0.82–1.77)

## 2018-05-22 LAB — TSH: TSH: 0.471 u[IU]/mL (ref 0.320–4.118)

## 2018-05-22 MED ORDER — SODIUM CHLORIDE 0.9% FLUSH
10.0000 mL | INTRAVENOUS | Status: DC | PRN
  Administered 2018-05-22: 10 mL
  Filled 2018-05-22: qty 10

## 2018-05-22 MED ORDER — HEPARIN SOD (PORK) LOCK FLUSH 100 UNIT/ML IV SOLN
500.0000 [IU] | Freq: Once | INTRAVENOUS | Status: AC | PRN
  Administered 2018-05-22: 500 [IU]
  Filled 2018-05-22: qty 5

## 2018-05-22 NOTE — Telephone Encounter (Signed)
Called and spoke with patient regarding appointments being added patient is ok with date/time/location/ letter/calendar mailed per 3/4 staff message

## 2018-05-22 NOTE — Progress Notes (Signed)
[place] OFFICE PROGRESS NOTE  Patient Care Team: Martinique, Betty G, MD as PCP - General (Family Medicine) Eppie Gibson, MD as Attending Physician (Radiation Oncology) Tish Men, MD as Consulting Physician (Hematology) Leota Sauers, RN as Oncology Nurse Navigator (Oncology) Karie Mainland, RD as Dietitian (Nutrition) Sharen Counter, CCC-SLP as Speech Language Pathologist (Speech Pathology) Wynelle Beckmann, Melodie Bouillon, PT as Physical Therapist (Physical Therapy)  HEME/ONC OVERVIEW: 1. 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, p16+; currently NED  -Definitive chemoRT with q3week cisplatin   - Cisplatin: 12/28/2017 - 01/28/2018; received 2 doses of cisplatin; 2nd dose reduced due to Grade 3 neutropenia; 3rd dose omitted due to Grade 3 bilateral tinnitus   - RT: 12/27/2017 - 02/13/2018 -05/2018: post-treatment PET NED   2. PEG in place; port removed   ASSESSMENT & PLAN:   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, p16+ -I independently reviewed the radiologic images of recent PET, and agree with the findings document -In summary, PET in 05/2018 showed no evidence of disease -I reviewed the imaging results in detail with the patient, as well as NCCN guideline  -For follow-up:  -q2-29months x 1 year, then q37months x 1 year, then q38months x 3 years  -q3-20month thyroid function monitoring  -Fiberoptic exam by ENT (to be coordinated by Dr. Constance Holster) -I counseled the patient on importance of jaw and shoulder exercises, and also reinforced importance of maintaining adequate oral hygiene  Leukopenia -Likely secondary to mild marrow suppression due to recent chemotherapy -WBC 3.1k with ANC 2400 -Patient denies any symptoms of infection -We will monitor for now  Normocytic anemia -Likely secondary to mild marrow suppression due to recent chemotherapy -Hgb 12.3 today -Patient denies any symptoms of bleeding -We will monitor for  now  Protein malnutrition -Currently taking 5 cans of Osmolite per day via feeding tube, and trying some solid food, such as chicken noodle soup -I encouraged patient to continue increase p.o. intake as tolerated with a goal to remove the feeding tube in the next few weeks when he is able to maintain adequate p.o. intake and his weight  Port access -Given NED on PET, I have ordered port removal  Orders Placed This Encounter  Procedures  . IR REMOVAL TUN ACCESS W/ PORT W/O FL MOD SED    Standing Status:   Future    Standing Expiration Date:   07/22/2019    Order Specific Question:   Reason for exam:    Answer:   Port removal, completed chemoradiation    Order Specific Question:   Preferred Imaging Location?    Answer:   [facility]  . Comprehensive metabolic panel  . CBC with Differential (Cancer Center Only)    Standing Status:   Future    Standing Expiration Date:   06/26/2019  . CMP ([place] only)    Standing Status:   Future    Standing Expiration Date:   06/26/2019  . TSH    Standing Status:   Future    Standing Expiration Date:   05/22/2019  . T4, free    Standing Status:   Future    Standing Expiration Date:   05/22/2019    All questions were answered. The patient knows to call the clinic with any problems, questions or concerns. No barriers to learning was detected.  A total of more than 25 minutes were spent face-to-face with the patient during this encounter and over half of  that time was spent on counseling and coordination of care as outlined above.   Return in 2 months for labs and clinic appt at [facility].   Tish Men, MD 05/22/2018 5:06 PM  CHIEF COMPLAINT: "I am getting better "  INTERVAL HISTORY: [name] returns to clinic for follow-up of squamous cell carcinoma of the right tonsil status post definitive chemoradiation.  He had a PET scan on 05/20/2018, which showed no evidence disease.  He reports that he still has mild intermittent pain  with swallowing, but is able to start increasing his p.o. intake, including some soft food.  He is still administering 5 cans of Osmolite per day via the feeding tube.  He otherwise denies any other complaint today.  SUMMARY OF ONCOLOGIC HISTORY:   Malignant neoplasm of tonsillar fossa ([place])   10/30/2017 Miscellaneous    Presented to Dr. Redmond Baseman with 3 months of R neck mass    11/07/2017 Imaging    CT neck w/ contrast:  1. Right tonsillar mass measures up to 2.6 cm, highly concerning for a squamous cell carcinoma. 2. Enlarged homogeneous right level 2 lymph node measures up to 3.9 cm, consistent with metastatic disease. 3. Adjacent right level 3 lymph node measures 1.1 cm. 4. Lucency at C4 on the left is likely degenerative. Metastatic disease is not excluded. Please correlate with findings on PET scan if used for further evaluation.    11/12/2017 Procedure    R tonsillar biopsy by Dr. Janace Hoard    11/12/2017 Pathology Results    Accession: [id number] Invasive squamous cell carcinoma; p16 diffusely positive    11/28/2017 Imaging    PET scan: 1. Right tonsillar fossa mass is intensely hypermetabolic compatible with primary neoplasm. 2. Solitary enlarged and hypermetabolic right level 2 cervical lymph node compatible with metastatic adenopathy. 3. No evidence for FDG avid hypermetabolic distant metastasis.    11/30/2017 Cancer Staging    Staging form: Pharynx - HPV-Mediated Oropharynx, AJCC 8th Edition - Clinical stage from 11/30/2017: Stage I (cT2, cN1, cM0, p16+) - Signed by Eppie Gibson, MD on 11/30/2017    12/07/2017 Miscellaneous    ENT evaluation to [facility] recommended against surgical resection due to the tumor extending into the right inferior nasopharynx and posterior soft palate, which would require resection of almost 1/2 of the soft palate and the right nasopharyngeal wall extending into right posterior/lateral posterior wall.     Procedure    12/26/2017: port and PEG tube  placement     12/12/2017 -  Chemotherapy    The patient had palonosetron (ALOXI) injection 0.25 mg, 0.25 mg, Intravenous,  Once, 2 of 3 cycles Administration: 0.25 mg (12/28/2017), 0.25 mg (01/25/2018) CISplatin (PLATINOL) 196 mg in sodium chloride 0.9 % 500 mL chemo infusion, 100 mg/m2 = 196 mg, Intravenous,  Once, 2 of 3 cycles Dose modification: 80 mg/m2 (original dose 100 mg/m2, Cycle 2, Reason: Dose not tolerated, Comment: Neutropenia) Administration: 196 mg (12/28/2017), 157 mg (01/25/2018) fosaprepitant (EMEND) 150 mg, dexamethasone (DECADRON) 12 mg in sodium chloride 0.9 % 145 mL IVPB, , Intravenous,  Once, 2 of 3 cycles Administration:  (12/28/2017),  (01/25/2018)  for chemotherapy treatment.      REVIEW OF SYSTEMS:   Constitutional: ( - ) fevers, ( - )  chills , ( - ) night sweats Eyes: ( - ) blurriness of vision, ( - ) double vision, ( - ) watery eyes Ears, nose, mouth, throat, and face: ( - ) mucositis, ( + ) sore throat Respiratory: ( - )  cough, ( - ) dyspnea, ( - ) wheezes Cardiovascular: ( - ) palpitation, ( - ) chest discomfort, ( - ) lower extremity swelling Gastrointestinal:  ( - ) nausea, ( - ) heartburn, ( - ) change in bowel habits Skin: ( - ) abnormal skin rashes Lymphatics: ( - ) new lymphadenopathy, ( - ) easy bruising Neurological: ( - ) numbness, ( - ) tingling, ( - ) new weaknesses Behavioral/Psych: ( - ) mood change, ( - ) new changes  All other systems were reviewed with the patient and are negative.  I have reviewed the past medical history, past surgical history, social history and family history with the patient and they are unchanged from previous note.  ALLERGIES:  has No Known Allergies.  MEDICATIONS:  Current Outpatient Medications  Medication Sig Dispense Refill  . lidocaine-prilocaine (EMLA) cream Apply to affected area once 30 g 3  . Nutritional Supplements (FEEDING SUPPLEMENT, OSMOLITE 1.5 CAL,) LIQD Begin 1.5 bottles Osmolite 1.5 via PEG QID  with 60 mL free water before and after bolus feedings. Mix 30 mL Promod with 60 mL water BID and put into PEG. Flush with 30 mL water. Do not mix Promod with Osmolite 1.5 6 Bottle 0  . sodium fluoride (PREVIDENT) 1.1 % GEL dental gel Place 1 application onto teeth at bedtime. Applied to to fresh. Brush teeth at bedtime for 2 minutes. Spit out excess. Do not rinse afterwards.    . Morphine Sulfate (MORPHINE CONCENTRATE) 10 mg / 0.5 ml concentrated solution Place 10 mg into feeding tube every 3 (three) hours as needed for moderate pain or severe pain.    Marland Kitchen ondansetron (ZOFRAN) 8 MG tablet Take 8 mg by mouth every 8 (eight) hours as needed for nausea or vomiting.    . prochlorperazine (COMPAZINE) 10 MG tablet Take 1 tablet (10 mg total) by mouth every 6 (six) hours as needed (Nausea or vomiting). (Patient not taking: Reported on 05/21/2018) 30 tablet 1   No current facility-administered medications for this visit.     PHYSICAL EXAMINATION: ECOG PERFORMANCE STATUS: 1 - Symptomatic but completely ambulatory  Today's Vitals   05/22/18 1436  PainSc: 0-No pain   There is no height or weight on file to calculate BMI.  There were no vitals filed for this visit.  GENERAL: alert, no distress and comfortable SKIN: skin color, texture, turgor are normal, no rashes or significant lesions EYES: conjunctiva are pink and non-injected, sclera clear OROPHARYNX: no exudate, no erythema; lips, buccal mucosa, and tongue normal  NECK: supple, non-tender LYMPH:  no palpable lymphadenopathy in the cervical LUNGS: clear to auscultation and percussion with normal breathing effort HEART: regular rate & rhythm and no murmurs and no lower extremity edema ABDOMEN: soft, non-tender, non-distended, normal bowel sounds Musculoskeletal: no cyanosis of digits and no clubbing  PSYCH: alert & oriented x 3, fluent speech NEURO: no focal motor/sensory deficits  LABORATORY DATA:  I have reviewed the data as listed     Component Value Date/Time   NA 138 05/22/2018 1350   K 3.9 05/22/2018 1350   CL 103 05/22/2018 1350   CO2 29 05/22/2018 1350   GLUCOSE 94 05/22/2018 1350   BUN 19 05/22/2018 1350   CREATININE 0.92 05/22/2018 1350   CREATININE 1.02 04/16/2018 1528   CALCIUM 9.3 05/22/2018 1350   PROT 7.0 05/22/2018 1350   ALBUMIN 4.3 05/22/2018 1350   AST 16 05/22/2018 1350   AST 11 (L) 04/16/2018 1528   ALT 15 05/22/2018 1350  ALT 13 04/16/2018 1528   ALKPHOS 60 05/22/2018 1350   BILITOT 0.6 05/22/2018 1350   BILITOT 0.5 04/16/2018 1528   GFRNONAA >60 05/22/2018 1350   GFRNONAA >60 04/16/2018 1528   GFRAA >60 05/22/2018 1350   GFRAA >60 04/16/2018 1528    No results found for: SPEP, UPEP  Lab Results  Component Value Date   WBC 3.1 (L) 05/22/2018   NEUTROABS 2.4 05/22/2018   HGB 12.3 (L) 05/22/2018   HCT 37.4 (L) 05/22/2018   MCV 91.0 05/22/2018   PLT 154 05/22/2018      Chemistry      Component Value Date/Time   NA 138 05/22/2018 1350   K 3.9 05/22/2018 1350   CL 103 05/22/2018 1350   CO2 29 05/22/2018 1350   BUN 19 05/22/2018 1350   CREATININE 0.92 05/22/2018 1350   CREATININE 1.02 04/16/2018 1528      Component Value Date/Time   CALCIUM 9.3 05/22/2018 1350   ALKPHOS 60 05/22/2018 1350   AST 16 05/22/2018 1350   AST 11 (L) 04/16/2018 1528   ALT 15 05/22/2018 1350   ALT 13 04/16/2018 1528   BILITOT 0.6 05/22/2018 1350   BILITOT 0.5 04/16/2018 1528       RADIOGRAPHIC STUDIES: I have personally reviewed the radiological images as listed below and agreed with the findings in the report. Nm Pet Image Restag (ps) Skull Base To Thigh  Result Date: 05/16/2018 CLINICAL DATA:  Initial treatment strategy for head neck cancer. Tonsil carcinoma. EXAM: NUCLEAR MEDICINE PET SKULL BASE TO THIGH TECHNIQUE: 7.0 mCi F-18 FDG was injected intravenously. Full-ring PET imaging was performed from the skull base to thigh after the radiotracer. CT data was obtained and used for  attenuation correction and anatomic localization. Fasting blood glucose: 99 mg/dl COMPARISON:  PET-CT 11/28/2017 FINDINGS: Mediastinal blood pool activity: SUV max 2.1 NECK: Resolution hypermetabolic activity in the RIGHT lingual tonsil region. Resolution of metabolic activity and size of previously seen RIGHT level 2 lymph node. No residual hypermetabolic LEFT or RIGHT cervical lymph nodes. Incidental CT findings: none CHEST: No hypermetabolic mediastinal or hilar nodes. No suspicious pulmonary nodules on the CT scan. Incidental CT findings: Port in the anterior chest wall with tip in distal SVC. ABDOMEN/PELVIS: No abnormal hypermetabolic activity within the liver, pancreas, adrenal glands, or spleen. No hypermetabolic lymph nodes in the abdomen or pelvis. Incidental CT findings: Peg tube in place.  Prostate enlarged. SKELETON: No focal hypermetabolic activity to suggest skeletal metastasis. Incidental CT findings: none IMPRESSION: 1. Resolution of metabolic activity in the RIGHT hypopharynx / tonsil. 2. Resolution of hypermetabolic cervical adenopathy on the RIGHT. 3. No evidence metastatic cervical adenopathy in the neck. 4. No evidence distant metastatic disease. 5. Peg tube in good position. Electronically Signed   By: Suzy Bouchard M.D.   On: 05/16/2018 12:58

## 2018-05-23 ENCOUNTER — Telehealth: Payer: Self-pay | Admitting: Hematology

## 2018-05-23 ENCOUNTER — Other Ambulatory Visit: Payer: Self-pay | Admitting: Student

## 2018-05-23 ENCOUNTER — Telehealth: Payer: Self-pay | Admitting: *Deleted

## 2018-05-23 ENCOUNTER — Ambulatory Visit: Payer: 59 | Admitting: Physical Therapy

## 2018-05-23 NOTE — Telephone Encounter (Signed)
Per [place], to be scheduled at [place]

## 2018-05-23 NOTE — Telephone Encounter (Signed)
Oncology Nurse Navigator Documentation  Per patient's 3/3 post-treatment follow-up with Dr. Isidore Moos, called [facility] to coordinate appointment with Dr. Constance Holster.  At her request, faxed request patient be contacted and scheduled for routine follow-up in early June.  Notification of successful fax transmission received.  Gayleen Orem, RN, BSN Head & Neck Oncology Nurse [place] at [place] [phone]

## 2018-05-24 ENCOUNTER — Ambulatory Visit (HOSPITAL_COMMUNITY)
Admission: RE | Admit: 2018-05-24 | Discharge: 2018-05-24 | Disposition: A | Discharge status: Home / Self Care | Payer: 59 | Source: Ambulatory Visit | Attending: Hematology | Admitting: Hematology

## 2018-05-24 ENCOUNTER — Encounter (HOSPITAL_COMMUNITY): Payer: Self-pay

## 2018-05-24 DIAGNOSIS — Z5111 Encounter for antineoplastic chemotherapy: Secondary | ICD-10-CM | POA: Diagnosis not present

## 2018-05-24 DIAGNOSIS — C760 Malignant neoplasm of head, face and neck: Secondary | ICD-10-CM | POA: Diagnosis not present

## 2018-05-24 DIAGNOSIS — Z452 Encounter for adjustment and management of vascular access device: Secondary | ICD-10-CM | POA: Diagnosis not present

## 2018-05-24 DIAGNOSIS — Z923 Personal history of irradiation: Secondary | ICD-10-CM | POA: Diagnosis not present

## 2018-05-24 DIAGNOSIS — C099 Malignant neoplasm of tonsil, unspecified: Secondary | ICD-10-CM | POA: Diagnosis not present

## 2018-05-24 DIAGNOSIS — C090 Malignant neoplasm of tonsillar fossa: Secondary | ICD-10-CM

## 2018-05-24 HISTORY — PX: IR REMOVAL TUN ACCESS W/ PORT W/O FL MOD SED: IMG2290

## 2018-05-24 LAB — CBC WITH DIFFERENTIAL/PLATELET
Abs Immature Granulocytes: 0.02 10*3/uL (ref 0.00–0.07)
Basophils Absolute: 0 10*3/uL (ref 0.0–0.1)
Basophils Relative: 0 %
Eosinophils Absolute: 0 10*3/uL (ref 0.0–0.5)
Eosinophils Relative: 1 %
HCT: 44.4 % (ref 39.0–52.0)
Hemoglobin: 14.2 g/dL (ref 13.0–17.0)
Immature Granulocytes: 1 %
Lymphocytes Relative: 9 %
Lymphs Abs: 0.3 10*3/uL — ABNORMAL LOW (ref 0.7–4.0)
MCH: 29.9 pg (ref 26.0–34.0)
MCHC: 32 g/dL (ref 30.0–36.0)
MCV: 93.5 fL (ref 80.0–100.0)
Monocytes Absolute: 0.3 10*3/uL (ref 0.1–1.0)
Monocytes Relative: 9 %
Neutro Abs: 2.6 10*3/uL (ref 1.7–7.7)
Neutrophils Relative %: 80 %
Platelets: 149 10*3/uL — ABNORMAL LOW (ref 150–400)
RBC: 4.75 MIL/uL (ref 4.22–5.81)
RDW: 11.2 % — ABNORMAL LOW (ref 11.5–15.5)
WBC: 3.2 10*3/uL — ABNORMAL LOW (ref 4.0–10.5)
nRBC: 0 % (ref 0.0–0.2)

## 2018-05-24 LAB — PROTIME-INR
INR: 1 (ref 0.8–1.2)
Prothrombin Time: 12.6 seconds (ref 11.4–15.2)

## 2018-05-24 LAB — APTT: aPTT: 25 seconds (ref 24–36)

## 2018-05-24 MED ORDER — LIDOCAINE-EPINEPHRINE (PF) 1 %-1:200000 IJ SOLN
INTRAMUSCULAR | Status: AC | PRN
  Administered 2018-05-24: 10 mL

## 2018-05-24 MED ORDER — LIDOCAINE HCL 1 % IJ SOLN
INTRAMUSCULAR | Status: AC
  Filled 2018-05-24: qty 20

## 2018-05-24 MED ORDER — SODIUM CHLORIDE 0.9 % IV SOLN: INTRAVENOUS | Status: DC

## 2018-05-24 MED ORDER — FENTANYL CITRATE (PF) 100 MCG/2ML IJ SOLN
INTRAMUSCULAR | Status: AC
  Filled 2018-05-24: qty 2

## 2018-05-24 MED ORDER — SODIUM CHLORIDE 0.9 % IV SOLN
INTRAVENOUS | Status: DC
  Administered 2018-05-24: 11:00:00 via INTRAVENOUS

## 2018-05-24 MED ORDER — CEFAZOLIN SODIUM-DEXTROSE 2-4 GM/100ML-% IV SOLN
2.0000 g | Freq: Once | INTRAVENOUS | Status: AC
  Administered 2018-05-24: 2 g via INTRAVENOUS
  Filled 2018-05-24: qty 100

## 2018-05-24 MED ORDER — MIDAZOLAM HCL 2 MG/2ML IJ SOLN
INTRAMUSCULAR | Status: AC | PRN
  Administered 2018-05-24 (×3): 1 mg via INTRAVENOUS

## 2018-05-24 MED ORDER — MIDAZOLAM HCL 2 MG/2ML IJ SOLN
INTRAMUSCULAR | Status: AC
  Filled 2018-05-24: qty 4

## 2018-05-24 MED ORDER — FENTANYL CITRATE (PF) 100 MCG/2ML IJ SOLN
INTRAMUSCULAR | Status: AC | PRN
  Administered 2018-05-24: 50 ug via INTRAVENOUS

## 2018-05-24 MED ORDER — CEFAZOLIN SODIUM-DEXTROSE 2-4 GM/100ML-% IV SOLN
INTRAVENOUS | Status: AC
  Filled 2018-05-24: qty 100

## 2018-05-24 NOTE — Discharge Instructions (Signed)
Incision Care, Adult °An incision is a cut that a doctor makes in your skin for surgery (for a procedure). Most times, these cuts are closed after surgery. Your cut from surgery may be closed with stitches (sutures), staples, skin glue, or skin tape (adhesive strips). You may need to return to your doctor to have stitches or staples taken out. This may happen many days or many weeks after your surgery. The cut needs to be well cared for so it does not get infected. °How to care for your cut °Cut care ° °· Follow instructions from your doctor about how to take care of your cut. Make sure you: °? Wash your hands with soap and water before you change your bandage (dressing). If you cannot use soap and water, use hand sanitizer. °? Change your bandage as told by your doctor. °? Leave stitches, skin glue, or skin tape in place. They may need to stay in place for 2 weeks or longer. If tape strips get loose and curl up, you may trim the loose edges. Do not remove tape strips completely unless your doctor says it is okay. °· Check your cut area every day for signs of infection. Check for: °? More redness, swelling, or pain. °? More fluid or blood. °? Warmth. °? Pus or a bad smell. °· Ask your doctor how to clean the cut. This may include: °? Using mild soap and water. °? Using a clean towel to pat the cut dry after you clean it. °? Putting a cream or ointment on the cut. Do this only as told by your doctor. °? Covering the cut with a clean bandage. °· Ask your doctor when you can leave the cut uncovered. °· Do not take baths, swim, or use a hot tub until your doctor says it is okay. Ask your doctor if you can take showers. You may only be allowed to take sponge baths for bathing. °Medicines °· If you were prescribed an antibiotic medicine, cream, or ointment, take the antibiotic or put it on the cut as told by your doctor. Do not stop taking or putting on the antibiotic even if your condition gets better. °· Take  over-the-counter and prescription medicines only as told by your doctor. °General instructions °· Limit movement around your cut. This helps healing. °? Avoid straining, lifting, or exercise for the first month, or for as long as told by your doctor. °? Follow instructions from your doctor about going back to your normal activities. °? Ask your doctor what activities are safe. °· Protect your cut from the sun when you are outside for the first 6 months, or for as long as told by your doctor. Put on sunscreen around the scar or cover up the scar. °· Keep all follow-up visits as told by your doctor. This is important. °Contact a doctor if: °· Your have more redness, swelling, or pain around the cut. °· You have more fluid or blood coming from the cut. °· Your cut feels warm to the touch. °· You have pus or a bad smell coming from the cut. °· You have a fever or shaking chills. °· You feel sick to your stomach (nauseous) or you throw up (vomit). °· You are dizzy. °· Your stitches or staples come undone. °Get help right away if: °· You have a red streak coming from your cut. °· Your cut bleeds through the bandage and the bleeding does not stop with gentle pressure. °· The edges of your cut   open up and separate.  You have very bad (severe) pain.  You have a rash.  You are confused.  You pass out (faint).  You have trouble breathing and you have a fast heartbeat. This information is not intended to replace advice given to you by your health care provider. Make sure you discuss any questions you have with your health care provider. Document Released: 05/29/2011 Document Revised: 11/12/2015 Document Reviewed: 11/12/2015 Elsevier Interactive Patient Education  2019 [employer]. Moderate Conscious Sedation, Adult, Care After These instructions provide you with information about caring for yourself after your procedure. Your health care provider may also give you more specific instructions. Your treatment has  been planned according to current medical practices, but problems sometimes occur. Call your health care provider if you have any problems or questions after your procedure. What can I expect after the procedure? After your procedure, it is common:  To feel sleepy for several hours.  To feel clumsy and have poor balance for several hours.  To have poor judgment for several hours.  To vomit if you eat too soon. Follow these instructions at home: For at least 24 hours after the procedure:   Do not: ? Participate in activities where you could fall or become injured. ? Drive. ? Use heavy machinery. ? Drink alcohol. ? Take sleeping pills or medicines that cause drowsiness. ? Make important decisions or sign legal documents. ? Take care of children on your own.  Rest. Eating and drinking  Follow the diet recommended by your health care provider.  If you vomit: ? Drink water, juice, or soup when you can drink without vomiting. ? Make sure you have little or no nausea before eating solid foods. General instructions  Have a responsible adult stay with you until you are awake and alert.  Take over-the-counter and prescription medicines only as told by your health care provider.  If you smoke, do not smoke without supervision.  Keep all follow-up visits as told by your health care provider. This is important. Contact a health care provider if:  You keep feeling nauseous or you keep vomiting.  You feel light-headed.  You develop a rash.  You have a fever. Get help right away if:  You have trouble breathing. This information is not intended to replace advice given to you by your health care provider. Make sure you discuss any questions you have with your health care provider. Document Released: 12/25/2012 Document Revised: 08/09/2015 Document Reviewed: 06/26/2015 Elsevier Interactive Patient Education  2019 [employer].

## 2018-05-24 NOTE — Procedures (Signed)
Interventional Radiology Procedure Note  Procedure: Port removal  Complications: None  Estimated Blood Loss: < 10 mL  Findings: Right chest port removed in entirety. Wound closed.  Walter Grima T. Kelicia Youtz, M.D Pager:  [phone]    

## 2018-05-24 NOTE — H&P (Signed)
Chief Complaint: Patient was seen in consultation today for tonsillar cancer  Referring Physician(s): [place]  Supervising Physician: Aletta Edouard  Patient Status: [place]  History of Present Illness: Thomas Mccall is a 58 y.o. male with past medical history of right tonsilar cancer who has completed therapy.  He received chemotherapy via Port-A-Cath placed 12/26/17 by Dr. Pascal Lux.  Now that treatment has concluded without signs of recurrent disease, request is made for Port-A-Cath removal.  Patient presents today in his usual state of health.  He has been NPO. He does not take blood thinners.   Past Medical History:  Diagnosis Date  . Headache   . History of radiation therapy 12/27/17- 02/13/18   right tonsil and bilateral neck.   . Mass    right tonsil  . Wears glasses     Past Surgical History:  Procedure Laterality Date  . DIRECT LARYNGOSCOPY Right 11/12/2017   Procedure: DIRECT LARYNGOSCOPY;  Surgeon: Melissa Montane, MD;  Location: [place];  Service: ENT;  Laterality: Right;  Direct laryngoscopy and biopy of right tonsil mass  . IR GASTROSTOMY TUBE MOD SED  12/26/2017  . IR IMAGING GUIDED PORT INSERTION  12/26/2017  . TONSILLECTOMY AND ADENOIDECTOMY  1970  . WISDOM TOOTH EXTRACTION      Allergies: Patient has no known allergies.  Medications: Prior to Admission medications   Medication Sig Start Date End Date Taking? Authorizing Provider  Nutritional Supplements (FEEDING SUPPLEMENT, OSMOLITE 1.5 CAL,) LIQD Begin 1.5 bottles Osmolite 1.5 via PEG QID with 60 mL free water before and after bolus feedings. Mix 30 mL Promod with 60 mL water BID and put into PEG. Flush with 30 mL water. Do not mix Promod with Osmolite 1.5 01/09/18  Yes Eppie Gibson, MD  lidocaine-prilocaine (EMLA) cream Apply to affected area once 12/12/17   Tish Men, MD  Morphine Sulfate (MORPHINE CONCENTRATE) 10 mg / 0.5 ml concentrated solution Place 10 mg into feeding tube every 3 (three)  hours as needed for moderate pain or severe pain.    [provider]  ondansetron (ZOFRAN) 8 MG tablet Take 8 mg by mouth every 8 (eight) hours as needed for nausea or vomiting.    [provider]  prochlorperazine (COMPAZINE) 10 MG tablet Take 1 tablet (10 mg total) by mouth every 6 (six) hours as needed (Nausea or vomiting). Patient not taking: Reported on 05/21/2018 12/12/17   Tish Men, MD  sodium fluoride (PREVIDENT) 1.1 % GEL dental gel Place 1 application onto teeth at bedtime. Applied to to fresh. Brush teeth at bedtime for 2 minutes. Spit out excess. Do not rinse afterwards.    [provider]     Family History  Problem Relation Age of Onset  . Heart attack Maternal Grandmother   . Heart attack Maternal Grandfather   . Heart attack Paternal Grandmother   . Heart attack Paternal Grandfather     Social History   Socioeconomic History  . Marital status: Married    Spouse name: Not on file  . Number of children: Not on file  . Years of education: Not on file  . Highest education level: Not on file  Occupational History  . Not on file  Social Needs  . Financial resource strain: Not on file  . Food insecurity:    Worry: Not on file    Inability: Not on file  . Transportation needs:    Medical: No    Non-medical: No  Tobacco Use  . Smoking  status: Never Smoker  . Smokeless tobacco: Never Used  Substance and Sexual Activity  . Alcohol use: Not Currently  . Drug use: Never  . Sexual activity: Yes    Partners: Female  Lifestyle  . Physical activity:    Days per week: Not on file    Minutes per session: Not on file  . Stress: Not on file  Relationships  . Social connections:    Talks on phone: Not on file    Gets together: Not on file    Attends religious service: Not on file    Active member of club or organization: Not on file    Attends meetings of clubs or organizations: Not on file    Relationship status: Not on file  Other Topics  Concern  . Not on file  Social History Narrative  . Not on file     Review of Systems: A 12 point ROS discussed and pertinent positives are indicated in the HPI above.  All other systems are negative.  Review of Systems  Constitutional: Negative for fatigue and fever.  HENT: Positive for trouble swallowing.   Respiratory: Negative for cough and shortness of breath.   Cardiovascular: Negative for chest pain.  Gastrointestinal: Negative for abdominal pain.  Genitourinary: Negative for dysuria.  Musculoskeletal: Negative for back pain.  Neurological: Negative for weakness and headaches.  Psychiatric/Behavioral: Negative for behavioral problems and confusion.    Vital Signs: BP 121/89 (BP Location: Right Arm)   Pulse 77   Temp 97.8 F (36.6 C) (Oral)   Resp 18   Ht 5\' 10"  (1.778 m)   Wt 152 lb 12.5 oz (69.3 kg)   SpO2 100%   BMI 21.92 kg/m   Physical Exam Vitals signs and nursing note reviewed.  Constitutional:      Appearance: Normal appearance.  Neck:     Musculoskeletal: Normal range of motion and neck supple.  Cardiovascular:     Rate and Rhythm: Normal rate and regular rhythm.     Heart sounds: No murmur. No friction rub. No gallop.   Pulmonary:     Effort: Pulmonary effort is normal. No respiratory distress.     Breath sounds: Normal breath sounds.  Musculoskeletal: Normal range of motion.  Skin:    General: Skin is warm and dry.     Comments: Right Port-A-Cath in place without issue  Neurological:     General: No focal deficit present.     Mental Status: He is alert and oriented to person, place, and time.  Psychiatric:        Mood and Affect: Mood normal.        Behavior: Behavior normal.        Thought Content: Thought content normal.        Judgment: Judgment normal.          Imaging: Nm Pet Image Restag (ps) Skull Base To Thigh  Result Date: 05/16/2018 CLINICAL DATA:  Initial treatment strategy for head neck cancer. Tonsil carcinoma. EXAM:  NUCLEAR MEDICINE PET SKULL BASE TO THIGH TECHNIQUE: 7.0 mCi F-18 FDG was injected intravenously. Full-ring PET imaging was performed from the skull base to thigh after the radiotracer. CT data was obtained and used for attenuation correction and anatomic localization. Fasting blood glucose: 99 mg/dl COMPARISON:  PET-CT 11/28/2017 FINDINGS: Mediastinal blood pool activity: SUV max 2.1 NECK: Resolution hypermetabolic activity in the RIGHT lingual tonsil region. Resolution of metabolic activity and size of previously seen RIGHT level 2 lymph node. No residual  hypermetabolic LEFT or RIGHT cervical lymph nodes. Incidental CT findings: none CHEST: No hypermetabolic mediastinal or hilar nodes. No suspicious pulmonary nodules on the CT scan. Incidental CT findings: Port in the anterior chest wall with tip in distal SVC. ABDOMEN/PELVIS: No abnormal hypermetabolic activity within the liver, pancreas, adrenal glands, or spleen. No hypermetabolic lymph nodes in the abdomen or pelvis. Incidental CT findings: Peg tube in place.  Prostate enlarged. SKELETON: No focal hypermetabolic activity to suggest skeletal metastasis. Incidental CT findings: none IMPRESSION: 1. Resolution of metabolic activity in the RIGHT hypopharynx / tonsil. 2. Resolution of hypermetabolic cervical adenopathy on the RIGHT. 3. No evidence metastatic cervical adenopathy in the neck. 4. No evidence distant metastatic disease. 5. Peg tube in good position. Electronically Signed   By: Suzy Bouchard M.D.   On: 05/16/2018 12:58    Labs:  CBC: Recent Labs    03/26/18 1456 04/16/18 1528 05/22/18 1418 05/24/18 1100  WBC 5.1 4.2 3.1* 3.2*  HGB 11.2* 11.8* 12.3* 14.2  HCT 33.5* 35.8* 37.4* 44.4  PLT 157 171 154 149*    COAGS: Recent Labs    12/26/17 1224  INR 0.87  APTT 25    BMP: Recent Labs    03/06/18 1501 03/26/18 1456 04/16/18 1528 05/22/18 1350  NA 139 140 142 138  K 3.7 3.9 4.4 3.9  CL 104 104 104 103  CO2 28 26 28 29     GLUCOSE 154* 95 100* 94  BUN 15 15 22* 19  CALCIUM 9.2 9.7 9.9 9.3  CREATININE 0.82 0.83 1.02 0.92  GFRNONAA >60 >60 >60 >60  GFRAA >60 >60 >60 >60    LIVER FUNCTION TESTS: Recent Labs    12/12/17 1426 03/26/18 1456 04/16/18 1528 05/22/18 1350  BILITOT 0.5 0.5 0.5 0.6  AST 13* 13* 11* 16  ALT 20 15 13 15   ALKPHOS 75 64 71 60  PROT 7.3 6.9 7.3 7.0  ALBUMIN 4.2 4.1 4.3 4.3    TUMOR MARKERS: No results for input(s): AFPTM, CEA, CA199, CHROMGRNA in the last 8760 hours.  Assessment and Plan: Patient with past medical history of tonsillar cancer presents for Port-A-Cath removal at the completion of therapy.  Patient presents today in their usual state of health.  He has been NPO and is not currently on blood thinners.   Risks and benefits of image guided port-a-catheter placement was discussed with the patient including, but not limited to bleeding, infection, pneumothorax.  All of the patient's questions were answered, patient is agreeable to proceed. Consent signed and in chart.   Thank you for this interesting consult.  I greatly enjoyed meeting Thomas Mccall and look forward to participating in their care.  A copy of this report was sent to the requesting provider on this date.  Electronically Signed: Docia Barrier, PA 05/24/2018, 11:11 AM   I spent a total of  30 Minutes   in face to face in clinical consultation, greater than 50% of which was counseling/coordinating care for tonsillar cancer.

## 2018-05-27 ENCOUNTER — Ambulatory Visit: Payer: 59 | Admitting: Physical Therapy

## 2018-05-27 ENCOUNTER — Encounter: Payer: Self-pay | Admitting: Physical Therapy

## 2018-05-27 DIAGNOSIS — R29898 Other symptoms and signs involving the musculoskeletal system: Secondary | ICD-10-CM

## 2018-05-27 DIAGNOSIS — M6281 Muscle weakness (generalized): Secondary | ICD-10-CM

## 2018-05-27 NOTE — Patient Instructions (Signed)
Access Code: [email or web address]  URL: [email or web address]  Date: 05/27/2018  Prepared by: Jari Favre   Exercises  Shoulder Extension with Resistance - Palms Forward - 10 reps - 3 sets - 1x daily - 7x weekly  Standing Shoulder Row with Anchored Resistance - 10 reps - 3 sets - 1x daily - 7x weekly  Bent Knee Fallouts - 10 reps - 3 sets - 1x daily - 7x weekly  Bridge - 10 reps - 3 sets - 1x daily - 7x weekly  Standing Hip Flexion - 10 reps - 3 sets - 1x daily - 7x weekly  Hip Extension with Resistance Loop - 10 reps - 3 sets - 1x daily - 7x weekly  Hip Abduction with Resistance Loop - 10 reps - 3 sets - 1x daily - 7x weekly  Shoulder Overhead Press in Abduction with Dumbbells - 10 reps - 3 sets - 1x daily - 7x weekly  Seated Hamstring Stretch - 3 reps - 1 sets - 30 sec hold - 1x daily - 7x weekly  Standing Hamstring Stretch with Step - 3 reps - 1 sets - 30 sec hold - 1x daily - 7x weekly  Squat - 10 reps - 3 sets - 1x daily - 7x weekly

## 2018-05-27 NOTE — Therapy (Signed)
[id number] [address], [place], [place], [address] Phone: [phone]   Fax:  [phone]  Physical Therapy Treatment  Patient Details  Name: Thomas Mccall MRN: [id number] Date of Birth: 02-21-1961 Referring Provider (PT): Dr. Eppie Gibson   Encounter Date: 05/27/2018  PT End of Session - 05/27/18 1624    Visit Number  17    Number of Visits  18    Date for PT Re-Evaluation  05/29/18    Authorization Type  [employer]- 23 visit per year    PT Start Time  1615    PT Stop Time  1703    PT Time Calculation (min)  48 min    Activity Tolerance  Patient tolerated treatment well    Behavior During Therapy  [facility] for tasks assessed/performed       Past Medical History:  Diagnosis Date  . Headache   . History of radiation therapy 12/27/17- 02/13/18   right tonsil and bilateral neck.   . Mass    right tonsil  . Wears glasses     Past Surgical History:  Procedure Laterality Date  . DIRECT LARYNGOSCOPY Right 11/12/2017   Procedure: DIRECT LARYNGOSCOPY;  Surgeon: Melissa Montane, MD;  Location: [place];  Service: ENT;  Laterality: Right;  Direct laryngoscopy and biopy of right tonsil mass  . IR GASTROSTOMY TUBE MOD SED  12/26/2017  . IR IMAGING GUIDED PORT INSERTION  12/26/2017  . IR REMOVAL TUN ACCESS W/ PORT W/O FL MOD SED  05/24/2018  . TONSILLECTOMY AND ADENOIDECTOMY  1970  . WISDOM TOOTH EXTRACTION      There were no vitals filed for this visit.  Subjective Assessment - 05/27/18 1704    Subjective  I had the port removed on Friday.  I still get worried doing too much because there is pain where the tube is.  only lasts momentarily    Pertinent History  Diagnosis is right tonsil squamous cell carcinoma, stage I (cT2, cN1, cM0, p16+). Had T[facility] consult with [facility] surgeon, but decided against that. He has 35 treatments of radiation and 2 of 3 chemo treatments     Patient Stated Goals  want to build up my strength and endurance     Currently in Pain?  No/denies                       [facility] Adult PT Treatment/Exercise - 05/27/18 0001      Lumbar Exercises: Aerobic   UBE (Upper Arm Bike)  L3 x 57min fwd/back - with 30 sec speed up x 2 each way      Knee/Hip Exercises: Stretches   Active Hamstring Stretch  Right;Left;3 reps;30 seconds    Passive Hamstring Stretch  Right;Left;3 reps;20 seconds   with contract relax     Knee/Hip Exercises: Aerobic   Nustep  L2- 5 x 5 min      Knee/Hip Exercises: Standing   Knee Flexion  Left;Right;Strengthening;2 sets;10 reps   black mat   Knee Flexion Limitations  4#    Hip Flexion  Stengthening;Right;Left;10 reps;Knee bent;2 sets   4#   Hip Abduction  Stengthening;Right;Left;2 sets;10 reps;Knee bent   tapping #4, black mat   Forward Step Up  Both;1 set;10 reps;Hand Hold: 1;Step Height: 6"    Other Standing Knee Exercises  squats 4lb dumbells in each hand - press up - 15x    Other Standing Knee Exercises  dead lift motion without weights - 20x  PT Education - 05/27/18 1710    Education Details   Access Code: [email or web address]     Person(s) Educated  Patient    Methods  Explanation;Demonstration;Verbal cues;Handout    Comprehension  Verbalized understanding;Returned demonstration          PT Long Term Goals - 04/30/18 1721      PT LONG TERM GOAL #1   Title  Pt with increase the # of repetitions of sit to stand in 30 seconds to 13 indicating and impovement in general strength     Baseline  21    Status  Achieved      PT LONG TERM GOAL #2   Title  Pt will decrease the time to do a normal TUG to < 7.9 seconds indicating an improvment in functional mobilty to age matched norms    Baseline  5.67 sec    Status  Achieved      PT LONG TERM GOAL #3   Title  Pt will be independent in a home exercise program for strength and endurance     Baseline  still working on increased endurance    Time  6    Period  Weeks    Status  On-going      PT  LONG TERM GOAL #4   Title  Pt will be able to improve 6 MWT to 2000 feet or more due to improved endurance    Baseline  1760    Time  6    Period  Weeks    Status  New            Plan - 05/27/18 1709    Clinical Impression Statement  Pt had difficulty doing correct squat and bending forward at hips due to hamstring tightness.  Pt needed cues to not round his back and do hamstring stretches correctly.  Pt has made progress overall and is mostly self sufficient with his HEP.  He will benefit from one more session for successful tranisition to HEP.    PT Treatment/Interventions  Patient/family education;ADLs/Self Care Home Management;Therapeutic activities;Therapeutic exercise;Neuromuscular re-education;Manual techniques    PT Next Visit Plan  re-assess goals and review HEP    PT Home Exercise Plan  Access Code: [email or web address]     Consulted and Agree with Plan of Care  Patient       Patient will benefit from skilled therapeutic intervention in order to improve the following deficits and impairments:  Other (comment), Decreased mobility, Decreased knowledge of precautions, Decreased endurance, Decreased range of motion  Visit Diagnosis: Muscle weakness (generalized)  Other symptoms and signs involving the musculoskeletal system     Problem List Patient Active Problem List   Diagnosis Date Noted  . Skin lesion of back 04/22/2018  . Anemia due to antineoplastic chemotherapy 03/26/2018  . Tinnitus 02/06/2018  . Leukopenia due to antineoplastic chemotherapy ([place]) 01/30/2018  . Chemotherapy-induced thrombocytopenia 01/24/2018  . AKI (acute kidney injury) ([place]) 01/17/2018  . Chemotherapy-induced nausea 01/17/2018  . Dysgeusia 01/17/2018  . Malnutrition ([place]) 01/17/2018  . Mucositis due to chemotherapy 01/17/2018  . Port-A-Cath in place 01/09/2018  . Malignant neoplasm of tonsillar fossa ([place]) 11/30/2017    Jule Ser, PT 05/27/2018, 5:22 PM  [facility] [address] [place], [place], [address] Phone: [phone]   Fax:  [phone]  Name: Thomas Mccall MRN: [id number] Date of Birth: 08-24-1960

## 2018-05-29 ENCOUNTER — Other Ambulatory Visit: Payer: Self-pay

## 2018-05-29 ENCOUNTER — Encounter: Payer: Self-pay | Admitting: Physical Therapy

## 2018-05-29 ENCOUNTER — Ambulatory Visit: Payer: 59 | Admitting: Physical Therapy

## 2018-05-29 ENCOUNTER — Encounter: Payer: Self-pay | Admitting: Hematology

## 2018-05-29 DIAGNOSIS — M6281 Muscle weakness (generalized): Secondary | ICD-10-CM | POA: Diagnosis not present

## 2018-05-29 DIAGNOSIS — R29898 Other symptoms and signs involving the musculoskeletal system: Secondary | ICD-10-CM

## 2018-05-29 NOTE — Therapy (Signed)
[facility] [address] [place], [place], [address] Phone: [phone]   Fax:  [phone]  Physical Therapy Treatment  Patient Details  Name: Bayron Dalto MRN: [id number] Date of Birth: [birth year] Referring Provider (PT): Dr. Eppie Gibson   Encounter Date: 05/29/2018  PT End of Session - 05/29/18 1625    Visit Number  18    Date for PT Re-Evaluation  05/29/18    Authorization Type  [employer]- 23 visit per year    PT Start Time  1616    PT Stop Time  1700    PT Time Calculation (min)  44 min    Activity Tolerance  Patient tolerated treatment well       Past Medical History:  Diagnosis Date  . Headache   . History of radiation therapy 12/27/17- 02/13/18   right tonsil and bilateral neck.   . Mass    right tonsil  . Wears glasses     Past Surgical History:  Procedure Laterality Date  . DIRECT LARYNGOSCOPY Right 11/12/2017   Procedure: DIRECT LARYNGOSCOPY;  Surgeon: Melissa Montane, MD;  Location: [place];  Service: ENT;  Laterality: Right;  Direct laryngoscopy and biopy of right tonsil mass  . IR GASTROSTOMY TUBE MOD SED  12/26/2017  . IR IMAGING GUIDED PORT INSERTION  12/26/2017  . IR REMOVAL TUN ACCESS W/ PORT W/O FL MOD SED  05/24/2018  . TONSILLECTOMY AND ADENOIDECTOMY  1970  . WISDOM TOOTH EXTRACTION      There were no vitals filed for this visit.  Subjective Assessment - 05/29/18 1703    Subjective  I have been getting cramps in my calves.  It woke me up last night.    Currently in Pain?  No/denies                       [facility] Adult PT Treatment/Exercise - 05/29/18 0001      Self-Care   Self-Care  Other Self-Care Comments    Other Self-Care Comments   spiky roller for rolling out legs, soft foam roller      Knee/Hip Exercises: Standing   Knee Flexion  Left;Right;Strengthening;15 reps   2.5#   Hip Flexion  Stengthening;Right;Left;10 reps;Knee bent;2 sets   2.5#   Hip Abduction   Stengthening;Right;Left;2 sets;10 reps;Knee bent   2.5#   Other Standing Knee Exercises  squat with bar along spine for tactile cues      Shoulder Exercises: Pulleys   Other Pulley Exercises  shoulder ext green band - 20    Other Pulley Exercises  shoulder row blute band - 20x      Shoulder Exercises: ROM/Strengthening   UBE (Upper Arm Bike)  L3 3x3 sitting on green ball      Manual Therapy   Manual therapy comments  STM to bilat gastroc/soleus - large trigger points lateral calf Lt>Rt                  PT Long Term Goals - 05/29/18 1706      PT LONG TERM GOAL #3   Title  Pt will be independent in a home exercise program for strength and endurance     Status  Achieved      PT LONG TERM GOAL #4   Title  Pt will be able to improve 6 MWT to 2000 feet or more due to improved endurance    Baseline  did not re-test    Status  Deferred            Plan - 05/29/18 1704    Clinical Impression Statement  Pt was able to demonstrate self sufficiency with HEP and he was educated on how to work out muscle knots in her legs. pt is recommended to discharge from PT today.    PT Treatment/Interventions  Patient/family education;ADLs/Self Care Home Management;Therapeutic activities;Therapeutic exercise;Neuromuscular re-education;Manual techniques    PT Home Exercise Plan  Access Code: [email or web address]     Consulted and Agree with Plan of Care  Patient       Patient will benefit from skilled therapeutic intervention in order to improve the following deficits and impairments:  Other (comment), Decreased mobility, Decreased knowledge of precautions, Decreased endurance, Decreased range of motion  Visit Diagnosis: Muscle weakness (generalized)  Other symptoms and signs involving the musculoskeletal system     Problem List Patient Active Problem List   Diagnosis Date Noted  . Skin lesion of back 04/22/2018  . Anemia due to antineoplastic chemotherapy 03/26/2018  . Tinnitus  02/06/2018  . Leukopenia due to antineoplastic chemotherapy ([place]) 01/30/2018  . Chemotherapy-induced thrombocytopenia 01/24/2018  . AKI (acute kidney injury) ([place]) 01/17/2018  . Chemotherapy-induced nausea 01/17/2018  . Dysgeusia 01/17/2018  . Malnutrition ([place]) 01/17/2018  . Mucositis due to chemotherapy 01/17/2018  . Port-A-Cath in place 01/09/2018  . Malignant neoplasm of tonsillar fossa ([place]) 11/30/2017    Jule Ser, PT 05/29/2018, 5:09 PM  [facility] [address], [place], [place], [address] Phone: [phone]   Fax:  [phone]  Name: Syaire Saber MRN: [id number] Date of Birth: 1961-03-01  PHYSICAL THERAPY DISCHARGE SUMMARY  Visits from Start of Care: 18  Current functional level related to goals / functional outcomes: See above   Remaining deficits: See above   Education / Equipment: HEP  Plan: Patient agrees to discharge.  Patient goals were met. Patient is being discharged due to meeting the stated rehab goals.  ?????    [employer], PT 05/29/18 5:12 PM

## 2018-06-03 ENCOUNTER — Ambulatory Visit: Payer: 59

## 2018-06-28 DIAGNOSIS — C090 Malignant neoplasm of tonsillar fossa: Secondary | ICD-10-CM | POA: Diagnosis not present

## 2018-06-28 DIAGNOSIS — E860 Dehydration: Secondary | ICD-10-CM | POA: Diagnosis not present

## 2018-07-23 ENCOUNTER — Other Ambulatory Visit: Payer: Self-pay

## 2018-07-23 ENCOUNTER — Inpatient Hospital Stay (HOSPITAL_BASED_OUTPATIENT_CLINIC_OR_DEPARTMENT_OTHER): Payer: 59 | Admitting: Hematology

## 2018-07-23 ENCOUNTER — Encounter: Payer: Self-pay | Admitting: Hematology

## 2018-07-23 ENCOUNTER — Inpatient Hospital Stay: Payer: 59 | Attending: Hematology

## 2018-07-23 VITALS — BP 109/63 | HR 72 | Temp 99.0°F | Resp 18 | Ht 70.0 in | Wt 146.8 lb

## 2018-07-23 DIAGNOSIS — Z79899 Other long term (current) drug therapy: Secondary | ICD-10-CM

## 2018-07-23 DIAGNOSIS — D649 Anemia, unspecified: Secondary | ICD-10-CM

## 2018-07-23 DIAGNOSIS — R682 Dry mouth, unspecified: Secondary | ICD-10-CM | POA: Insufficient documentation

## 2018-07-23 DIAGNOSIS — Z923 Personal history of irradiation: Secondary | ICD-10-CM

## 2018-07-23 DIAGNOSIS — Z9221 Personal history of antineoplastic chemotherapy: Secondary | ICD-10-CM | POA: Diagnosis not present

## 2018-07-23 DIAGNOSIS — C090 Malignant neoplasm of tonsillar fossa: Secondary | ICD-10-CM

## 2018-07-23 DIAGNOSIS — Z931 Gastrostomy status: Secondary | ICD-10-CM | POA: Insufficient documentation

## 2018-07-23 DIAGNOSIS — E46 Unspecified protein-calorie malnutrition: Secondary | ICD-10-CM

## 2018-07-23 LAB — T4, FREE: Free T4: 0.88 ng/dL (ref 0.82–1.77)

## 2018-07-23 NOTE — Progress Notes (Signed)
[place] OFFICE PROGRESS NOTE  Patient Care Team: Martinique, Betty G, MD as PCP - General (Family Medicine) Eppie Gibson, MD as Attending Physician (Radiation Oncology) Tish Men, MD as Consulting Physician (Hematology) Leota Sauers, RN as Oncology Nurse Navigator (Oncology) Karie Mainland, RD as Dietitian (Nutrition) Valentino Saxon [place], CCC-SLP as Speech Language Pathologist (Speech Pathology) Wynelle Beckmann, Melodie Bouillon, PT as Physical Therapist (Physical Therapy)  HEME/ONC OVERVIEW: 1. 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 Constance Holster of ENT in 03/2018, but laryngoscopy was deferred at that time due to significant discomfort, having just completed the chemoradiation -I have sent a message to Dr. Constance Holster to set up surveillance scope in the near future (likely after the COVID outbreak) -For follow-up:  -q2-71months x 1 year, then q1months x 1 year, then q8months x 3 years  -q3-72month thyroid function monitoring  -Fiberoptic exam by ENT (to be coordinated by Dr. Constance Holster) -Periodic TSH, free T4 monitoring  -I counseled the patient on importance of strict jaw and shoulder exercises, and reinforced importance of maintaining adequate oral hygiene  Normocytic anemia -Likely  secondary to mild marrow suppression from high-dose cisplatin -Hgb 12.8 today, improving -Patient denies any symptoms of bleeding -I have ordered iron profile for the next visit to rule out any iron deficiency -We will monitor for now  Protein malnutrition -Overall, patient's weight has been stable -He is beginning to resume oral intake, and so far has tolerated the relatively well, including sandwiches, vegetables, and soup -He is still administering 2 cans of Osmolite in the morning via feeding tube -I congratulated patient on the progress he has made, and encouraged him to try nutritional supplement by mouth, such as Boost, Ensure, or Carnation Instant Breakfast over the next 2 weeks -Patient has some anxiety about removing the feeding tube, as it has been a safety net for him; I counseled him that he is ready to have the feeding tube removed soon  -If he is able to maintain his weight over the next 2-3 weeks, I instructed the patient to contact us so that we can coordinate an appointment with IR to have the feeding tube removed  Orders Placed This Encounter  Procedures  . CBC with Differential (Cancer Center Only)    Standing Status:   Future    Standing Expiration Date:   08/27/2019  . CMP ([place] [facility] only)    Standing Status:   Future    Standing Expiration Date:   08/27/2019  . Iron and TIBC    Standing Status:   Future    Standing Expiration Date:   08/27/2019  . Ferritin    Standing Status:   Future    Standing Expiration Date:   08/27/2019  . TSH    Standing Status:   Future    Standing Expiration Date:   07/23/2019  .  T4, free    Standing Status:   Future    Standing Expiration Date:   07/23/2019    All questions were answered. The patient knows to call the clinic with any problems, questions or concerns. No barriers to learning was detected.  A total of more than 25 minutes were spent face-to-face with the patient during this encounter and over half of that time was spent on  counseling and coordination of care as outlined above.   Return in 3 months for labs and clinic follow-up.  Tish Men, MD 07/23/2018 3:22 PM  CHIEF COMPLAINT: "I am eating a little better"  INTERVAL HISTORY: Mr. Winthrop returns clinic for follow-up of squamous cell carcinoma of the right tonsil s/p definitive chemoradiation with high-dose cisplatin.  Patient reports that over the past few weeks, he has been able to resume oral intake, including soups, sandwiches, and fruit.  He still using the feeding tube in the morning and administers 2 cartons of Osmolite.  He has persistent dry mouth for which he carries a bottle water with him all the time.  He is appetite is overall improving.  He denies any dysphasia or odynophagia.  He denies any neuropathy or ringing in the ear.  SUMMARY OF ONCOLOGIC HISTORY:   Malignant neoplasm of tonsillar fossa ([place])   10/30/2017 Miscellaneous    Presented to Dr. Redmond Baseman with 3 months of R neck mass    11/07/2017 Imaging    CT neck w/ contrast:  1. Right tonsillar mass measures up to 2.6 cm, highly concerning for a squamous cell carcinoma. 2. Enlarged homogeneous right level 2 lymph node measures up to 3.9 cm, consistent with metastatic disease. 3. Adjacent right level 3 lymph node measures 1.1 cm. 4. Lucency at C4 on the left is likely degenerative. Metastatic disease is not excluded. Please correlate with findings on PET scan if used for further evaluation.    11/12/2017 Procedure    R tonsillar biopsy by Dr. Janace Hoard    11/12/2017 Pathology Results    Accession: [id number] Invasive squamous cell carcinoma; p16 diffusely positive    11/28/2017 Imaging    PET scan: 1. Right tonsillar fossa mass is intensely hypermetabolic compatible with primary neoplasm. 2. Solitary enlarged and hypermetabolic right level 2 cervical lymph node compatible with metastatic adenopathy. 3. No evidence for FDG avid hypermetabolic distant metastasis.    11/30/2017 Cancer Staging     Staging form: Pharynx - HPV-Mediated Oropharynx, AJCC 8th Edition - Clinical stage from 11/30/2017: Stage I (cT2, cN1, cM0, p16+) - Signed by Eppie Gibson, MD on 11/30/2017    12/07/2017 Miscellaneous    ENT evaluation to [facility] recommended against surgical resection due to the tumor extending into the right inferior nasopharynx and posterior soft palate, which would require resection of almost 1/2 of the soft palate and the right nasopharyngeal wall extending into right posterior/lateral posterior wall.     Procedure    12/26/2017: port and PEG tube placement     12/12/2017 -  Chemotherapy    The patient had palonosetron (ALOXI) injection 0.25 mg, 0.25 mg, Intravenous,  Once, 2 of 3 cycles Administration: 0.25 mg (12/28/2017), 0.25 mg (01/25/2018) CISplatin (PLATINOL) 196 mg in sodium chloride 0.9 % 500 mL chemo infusion, 100 mg/m2 = 196 mg, Intravenous,  Once, 2 of 3 cycles Dose modification: 80 mg/m2 (original dose 100 mg/m2, Cycle 2, Reason: Dose not tolerated, Comment: Neutropenia) Administration: 196 mg (12/28/2017), 157 mg (01/25/2018) fosaprepitant (EMEND) 150 mg, dexamethasone (DECADRON) 12 mg in sodium  chloride 0.9 % 145 mL IVPB, , Intravenous,  Once, 2 of 3 cycles Administration:  (12/28/2017),  (01/25/2018)  for chemotherapy treatment.      REVIEW OF SYSTEMS:   Constitutional: ( - ) fevers, ( - )  chills , ( - ) night sweats Eyes: ( - ) blurriness of vision, ( - ) double vision, ( - ) watery eyes Ears, nose, mouth, throat, and face: ( - ) mucositis, ( - ) sore throat Respiratory: ( - ) cough, ( - ) dyspnea, ( - ) wheezes Cardiovascular: ( - ) palpitation, ( - ) chest discomfort, ( - ) lower extremity swelling Gastrointestinal:  ( - ) nausea, ( - ) heartburn, ( - ) change in bowel habits Skin: ( - ) abnormal skin rashes Lymphatics: ( - ) new lymphadenopathy, ( - ) easy bruising Neurological: ( - ) numbness, ( - ) tingling, ( - ) new weaknesses Behavioral/Psych: ( - ) mood  change, ( - ) new changes  All other systems were reviewed with the patient and are negative.  I have reviewed the past medical history, past surgical history, social history and family history with the patient and they are unchanged from previous note.  ALLERGIES:  has No Known Allergies.  MEDICATIONS:  Current Outpatient Medications  Medication Sig Dispense Refill  . Nutritional Supplements (FEEDING SUPPLEMENT, OSMOLITE 1.5 CAL,) LIQD Begin 1.5 bottles Osmolite 1.5 via PEG QID with 60 mL free water before and after bolus feedings. Mix 30 mL Promod with 60 mL water BID and put into PEG. Flush with 30 mL water. Do not mix Promod with Osmolite 1.5 6 Bottle 0  . sodium fluoride (PREVIDENT) 1.1 % GEL dental gel Place 1 application onto teeth at bedtime. Applied to to fresh. Brush teeth at bedtime for 2 minutes. Spit out excess. Do not rinse afterwards.    . lidocaine-prilocaine (EMLA) cream Apply to affected area once (Patient not taking: Reported on 07/23/2018) 30 g 3  . Morphine Sulfate (MORPHINE CONCENTRATE) 10 mg / 0.5 ml concentrated solution Place 10 mg into feeding tube every 3 (three) hours as needed for moderate pain or severe pain.    Marland Kitchen ondansetron (ZOFRAN) 8 MG tablet Take 8 mg by mouth every 8 (eight) hours as needed for nausea or vomiting.    . prochlorperazine (COMPAZINE) 10 MG tablet Take 1 tablet (10 mg total) by mouth every 6 (six) hours as needed (Nausea or vomiting). (Patient not taking: Reported on 05/21/2018) 30 tablet 1   No current facility-administered medications for this visit.     PHYSICAL EXAMINATION: ECOG PERFORMANCE STATUS: 1 - Symptomatic but completely ambulatory  Today's Vitals   07/23/18 1448  BP: 109/63  Pulse: 72  Resp: 18  Temp: 99 F (37.2 C)  TempSrc: Oral  SpO2: 100%  Weight: 146 lb 12.8 oz (66.6 kg)  Height: 5\' 10"  (1.778 m)  PainSc: 0-No pain   Body mass index is 21.06 kg/m.  Filed Weights   07/23/18 1448  Weight: 146 lb 12.8 oz (66.6 kg)     GENERAL: alert, no distress and comfortable SKIN: skin color, texture, turgor are normal, no rashes or significant lesions EYES: conjunctiva are pink and non-injected, sclera clear OROPHARYNX: no exudate, no erythema; lips, buccal mucosa, and tongue normal  NECK: supple, non-tender LYMPH:  no palpable lymphadenopathy in the cervical LUNGS: clear to auscultation with normal breathing effort HEART: regular rate & rhythm and no murmurs and no lower extremity edema ABDOMEN: soft, non-tender, non-distended,  normal bowel sounds Musculoskeletal: no cyanosis of digits and no clubbing  PSYCH: alert & oriented x 3, fluent speech, soft speaking NEURO: no focal motor/sensory deficits  LABORATORY DATA:  I have reviewed the data as listed    Component Value Date/Time   NA 138 05/22/2018 1350   K 3.9 05/22/2018 1350   CL 103 05/22/2018 1350   CO2 29 05/22/2018 1350   GLUCOSE 94 05/22/2018 1350   BUN 19 05/22/2018 1350   CREATININE 0.92 05/22/2018 1350   CREATININE 1.02 04/16/2018 1528   CALCIUM 9.3 05/22/2018 1350   PROT 7.0 05/22/2018 1350   ALBUMIN 4.3 05/22/2018 1350   AST 16 05/22/2018 1350   AST 11 (L) 04/16/2018 1528   ALT 15 05/22/2018 1350   ALT 13 04/16/2018 1528   ALKPHOS 60 05/22/2018 1350   BILITOT 0.6 05/22/2018 1350   BILITOT 0.5 04/16/2018 1528   GFRNONAA >60 05/22/2018 1350   GFRNONAA >60 04/16/2018 1528   GFRAA >60 05/22/2018 1350   GFRAA >60 04/16/2018 1528    No results found for: SPEP, UPEP  Lab Results  Component Value Date   WBC 3.2 (L) 05/24/2018   NEUTROABS 2.6 05/24/2018   HGB 14.2 05/24/2018   HCT 44.4 05/24/2018   MCV 93.5 05/24/2018   PLT 149 (L) 05/24/2018      Chemistry      Component Value Date/Time   NA 138 05/22/2018 1350   K 3.9 05/22/2018 1350   CL 103 05/22/2018 1350   CO2 29 05/22/2018 1350   BUN 19 05/22/2018 1350   CREATININE 0.92 05/22/2018 1350   CREATININE 1.02 04/16/2018 1528      Component Value Date/Time   CALCIUM  9.3 05/22/2018 1350   ALKPHOS 60 05/22/2018 1350   AST 16 05/22/2018 1350   AST 11 (L) 04/16/2018 1528   ALT 15 05/22/2018 1350   ALT 13 04/16/2018 1528   BILITOT 0.6 05/22/2018 1350   BILITOT 0.5 04/16/2018 1528       RADIOGRAPHIC STUDIES: I have personally reviewed the radiological images as listed below and agreed with the findings in the report. No results found.

## 2018-07-24 ENCOUNTER — Telehealth: Payer: Self-pay | Admitting: Hematology

## 2018-07-24 LAB — CMP (CANCER CENTER ONLY)
ALT: 10 U/L (ref 0–44)
AST: 12 U/L — ABNORMAL LOW (ref 15–41)
Albumin: 4.4 g/dL (ref 3.5–5.0)
Alkaline Phosphatase: 48 U/L (ref 38–126)
Anion gap: 7 (ref 5–15)
BUN: 15 mg/dL (ref 6–20)
CO2: 31 mmol/L (ref 22–32)
Calcium: 10 mg/dL (ref 8.9–10.3)
Chloride: 103 mmol/L (ref 98–111)
Creatinine: 0.95 mg/dL (ref 0.61–1.24)
GFR, Est AFR Am: >60 mL/min (ref >60)
GFR, Estimated: >60 mL/min (ref >60)
Glucose, Bld: 106 mg/dL — ABNORMAL HIGH (ref 70–99)
Potassium: 4.3 mmol/L (ref 3.5–5.1)
Sodium: 141 mmol/L (ref 135–145)
Total Bilirubin: 0.5 mg/dL (ref 0.3–1.2)
Total Protein: 6.8 g/dL (ref 6.5–8.1)

## 2018-07-24 LAB — CBC WITH DIFFERENTIAL (CANCER CENTER ONLY)
Abs Immature Granulocytes: 0 10*3/uL (ref 0.00–0.07)
Basophils Absolute: 0 10*3/uL (ref 0.0–0.1)
Basophils Relative: 0 %
Eosinophils Absolute: 0 10*3/uL (ref 0.0–0.5)
Eosinophils Relative: 1 %
HCT: 39 % (ref 39.0–52.0)
Hemoglobin: 12.8 g/dL — ABNORMAL LOW (ref 13.0–17.0)
Immature Granulocytes: 0 %
Lymphocytes Relative: 8 %
Lymphs Abs: 0.3 10*3/uL — ABNORMAL LOW (ref 0.7–4.0)
MCH: 29.7 pg (ref 26.0–34.0)
MCHC: 32.8 g/dL (ref 30.0–36.0)
MCV: 90.5 fL (ref 80.0–100.0)
Monocytes Absolute: 0.3 10*3/uL (ref 0.1–1.0)
Monocytes Relative: 8 %
Neutro Abs: 3.4 10*3/uL (ref 1.7–7.7)
Neutrophils Relative %: 83 %
Platelet Count: 164 10*3/uL (ref 150–400)
RBC: 4.31 MIL/uL (ref 4.22–5.81)
RDW: 11.8 % (ref 11.5–15.5)
WBC Count: 4 10*3/uL (ref 4.0–10.5)
nRBC: 0 % (ref 0.0–0.2)

## 2018-07-24 LAB — TSH: TSH: 0.571 u[IU]/mL (ref 0.320–4.118)

## 2018-07-24 NOTE — Telephone Encounter (Signed)
Appointments scheduled letter/calendar mailed per 5/5 los

## 2018-08-15 ENCOUNTER — Telehealth: Payer: Self-pay | Admitting: Nutrition

## 2018-08-15 NOTE — Telephone Encounter (Signed)
Contacted patient however he was unavailable.  I wanted to follow-up with him on how he was doing with advancing oral intake so his feeding tube could be removed.  I left him a message of my contact information for him to call me back.

## 2018-08-22 ENCOUNTER — Telehealth: Payer: Self-pay | Admitting: *Deleted

## 2018-08-22 ENCOUNTER — Other Ambulatory Visit: Payer: Self-pay | Admitting: *Deleted

## 2018-08-22 DIAGNOSIS — C090 Malignant neoplasm of tonsillar fossa: Secondary | ICD-10-CM

## 2018-08-22 NOTE — Telephone Encounter (Signed)
Thank you for letting me know. I agree with the feeding tube removal as outlined.  Dr. Maylon Peppers

## 2018-08-22 NOTE — Telephone Encounter (Signed)
Received call from patient requesting PEG tube removal.  He states he is taking all nutrition orally and has gained 5 lbs. He states he is ready to have it removed. Order placed for IR to remove his PEG tube. Call made to IR. They will contact him tomorrow to set up day and time for this.

## 2018-08-28 ENCOUNTER — Ambulatory Visit (HOSPITAL_COMMUNITY)
Admission: RE | Admit: 2018-08-28 | Discharge: 2018-08-28 | Disposition: A | Discharge status: Home / Self Care | Payer: 59 | Source: Ambulatory Visit | Attending: Hematology | Admitting: Hematology

## 2018-08-28 ENCOUNTER — Encounter: Payer: Self-pay | Admitting: Hematology

## 2018-08-28 ENCOUNTER — Encounter (HOSPITAL_COMMUNITY): Payer: Self-pay | Admitting: Student

## 2018-08-28 ENCOUNTER — Other Ambulatory Visit: Payer: Self-pay

## 2018-08-28 DIAGNOSIS — C090 Malignant neoplasm of tonsillar fossa: Secondary | ICD-10-CM | POA: Diagnosis not present

## 2018-08-28 DIAGNOSIS — Z431 Encounter for attention to gastrostomy: Secondary | ICD-10-CM | POA: Insufficient documentation

## 2018-08-28 HISTORY — PX: IR GASTROSTOMY TUBE REMOVAL: IMG5492

## 2018-08-28 MED ORDER — LIDOCAINE VISCOUS HCL 2 % MT SOLN
OROMUCOSAL | Status: AC
  Administered 2018-08-28: 10 mL
  Filled 2018-08-28: qty 15

## 2018-10-22 ENCOUNTER — Inpatient Hospital Stay: Payer: 59 | Attending: Hematology

## 2018-10-22 ENCOUNTER — Inpatient Hospital Stay (HOSPITAL_BASED_OUTPATIENT_CLINIC_OR_DEPARTMENT_OTHER): Payer: 59 | Admitting: Hematology

## 2018-10-22 ENCOUNTER — Encounter: Payer: Self-pay | Admitting: Hematology

## 2018-10-22 ENCOUNTER — Other Ambulatory Visit: Payer: Self-pay

## 2018-10-22 VITALS — BP 106/68 | HR 65 | Resp 17 | Ht 70.0 in | Wt 148.8 lb

## 2018-10-22 DIAGNOSIS — Z923 Personal history of irradiation: Secondary | ICD-10-CM | POA: Insufficient documentation

## 2018-10-22 DIAGNOSIS — Z9221 Personal history of antineoplastic chemotherapy: Secondary | ICD-10-CM | POA: Insufficient documentation

## 2018-10-22 DIAGNOSIS — Z79899 Other long term (current) drug therapy: Secondary | ICD-10-CM | POA: Diagnosis not present

## 2018-10-22 DIAGNOSIS — C090 Malignant neoplasm of tonsillar fossa: Secondary | ICD-10-CM | POA: Diagnosis not present

## 2018-10-22 DIAGNOSIS — Z719 Counseling, unspecified: Secondary | ICD-10-CM | POA: Diagnosis not present

## 2018-10-22 DIAGNOSIS — D72819 Decreased white blood cell count, unspecified: Secondary | ICD-10-CM | POA: Insufficient documentation

## 2018-10-22 LAB — CBC WITH DIFFERENTIAL (CANCER CENTER ONLY)
Abs Immature Granulocytes: 0.01 10*3/uL (ref 0.00–0.07)
Basophils Absolute: 0 10*3/uL (ref 0.0–0.1)
Basophils Relative: 1 %
Eosinophils Absolute: 0.1 10*3/uL (ref 0.0–0.5)
Eosinophils Relative: 2 %
HCT: 42.5 % (ref 39.0–52.0)
Hemoglobin: 13.7 g/dL (ref 13.0–17.0)
Immature Granulocytes: 0 %
Lymphocytes Relative: 12 %
Lymphs Abs: 0.4 10*3/uL — ABNORMAL LOW (ref 0.7–4.0)
MCH: 28.6 pg (ref 26.0–34.0)
MCHC: 32.2 g/dL (ref 30.0–36.0)
MCV: 88.7 fL (ref 80.0–100.0)
Monocytes Absolute: 0.3 10*3/uL (ref 0.1–1.0)
Monocytes Relative: 9 %
Neutro Abs: 2.4 10*3/uL (ref 1.7–7.7)
Neutrophils Relative %: 76 %
Platelet Count: 154 10*3/uL (ref 150–400)
RBC: 4.79 MIL/uL (ref 4.22–5.81)
RDW: 11.9 % (ref 11.5–15.5)
WBC Count: 3.2 10*3/uL — ABNORMAL LOW (ref 4.0–10.5)
nRBC: 0 % (ref 0.0–0.2)

## 2018-10-22 LAB — FERRITIN: Ferritin: 227 ng/mL (ref 24–336)

## 2018-10-22 LAB — CMP (CANCER CENTER ONLY)
ALT: 12 U/L (ref 0–44)
AST: 14 U/L — ABNORMAL LOW (ref 15–41)
Albumin: 4.4 g/dL (ref 3.5–5.0)
Alkaline Phosphatase: 57 U/L (ref 38–126)
Anion gap: 5 (ref 5–15)
BUN: 13 mg/dL (ref 6–20)
CO2: 31 mmol/L (ref 22–32)
Calcium: 9.4 mg/dL (ref 8.9–10.3)
Chloride: 104 mmol/L (ref 98–111)
Creatinine: 1.08 mg/dL (ref 0.61–1.24)
GFR, Est AFR Am: >60 mL/min (ref >60)
GFR, Estimated: >60 mL/min (ref >60)
Glucose, Bld: 89 mg/dL (ref 70–99)
Potassium: 4.8 mmol/L (ref 3.5–5.1)
Sodium: 140 mmol/L (ref 135–145)
Total Bilirubin: 0.5 mg/dL (ref 0.3–1.2)
Total Protein: 6.7 g/dL (ref 6.5–8.1)

## 2018-10-22 LAB — IRON AND TIBC
Iron: 76 ug/dL (ref 45–182)
Saturation Ratios: 26 % (ref 17.9–39.5)
TIBC: 295 ug/dL (ref 250–450)
UIBC: 219 ug/dL

## 2018-10-22 LAB — T4, FREE: Free T4: 0.8 ng/dL (ref 0.61–1.12)

## 2018-10-22 NOTE — Progress Notes (Signed)
[place] [place] OFFICE PROGRESS NOTE  Patient Care Team: Martinique, Betty G, MD as PCP - General (Family Medicine) Eppie Gibson, MD as Attending Physician (Radiation Oncology) Tish Men, MD as Consulting Physician (Hematology) Leota Sauers, RN as Oncology Nurse Navigator (Oncology) Karie Mainland, RD as Dietitian (Nutrition) Valentino Saxon [place], [place] as Speech Language Pathologist (Speech Pathology) Wynelle Beckmann, Melodie Bouillon, PT as Physical Therapist (Physical Therapy)  HEME/ONC OVERVIEW: 1. 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 Constance Holster for local disease surveillance since the last visit; I will contact the H&N navigator to help coordinate ENT follow-up  -Surveillance plan:  -q2-24months x 1 year, then q58months x 1 year, then q39months x 3 years  -q3-2month thyroid function monitoring  -Fiberoptic exam by ENT -Periodic TSH, free T4 monitoring  -I counseled the patient on importance of jaw and shoulder exercises, as well as speech therapy exercises to maintain swallow function   Leukopenia -Likely due to mild marrow suppression from hx of chemotherapy -WBC 3.2k w/ ANC 2400, slightly lower than last visit -Clinically, patient denies any symptoms of infection -We will monitor it for now   Health counseling -I counseled the  patient on the importance of vaccinations, including annual influenza vaccine, due to his impaired immune system and increased risk of infection  Orders Placed This Encounter  Procedures  . CBC with Differential (Cancer Center Only)    Standing Status:   Future    Standing Expiration Date:   11/26/2019  . CMP ([place] only)    Standing Status:   Future    Standing Expiration Date:   11/26/2019  . TSH    Standing Status:   Future    Standing Expiration Date:   10/22/2019  . T4, free    Standing Status:   Future    Standing Expiration Date:   10/22/2019   All questions were answered. The patient knows to call the clinic with any problems, questions or concerns. No barriers to learning was detected.  A total of more than 25 minutes were spent face-to-face with the patient during this encounter and over half of that time was spent on counseling and coordination of care as outlined above.   Return in 3 months for labs and clinic follow-up.   Tish Men, MD 10/22/2018 3:21 PM  CHIEF COMPLAINT: "I am doing fine"  INTERVAL HISTORY: Mr. Lariccia returns to clinic for follow-up description of carcinoma of the right tonsil s/p definitive chemoradiation.  Patient reports that since the last visit, he has been able to eat more and gaining some weight.  He is still having some trouble with heavily starch products, such as bread rolls and bagels, but he is able to eat most food items without limitation.  His feeding tube was removed in 08/2018.  He still has occasional sore throat, which he feels is more psychologically after completing chemoradiation, but the  discomfort is very transient and does not require any intervention.  He has not yet seen Dr. Constance Holster since last visit.  He denies any other complaint today.  SUMMARY OF ONCOLOGIC HISTORY: Oncology History  Malignant neoplasm of tonsillar fossa ([place])  10/30/2017 Miscellaneous   Presented to Dr. Redmond Baseman with 3 months of R neck mass   11/07/2017 Imaging    CT neck w/ contrast:  1. Right tonsillar mass measures up to 2.6 cm, highly concerning for a squamous cell carcinoma. 2. Enlarged homogeneous right level 2 lymph node measures up to 3.9 cm, consistent with metastatic disease. 3. Adjacent right level 3 lymph node measures 1.1 cm. 4. Lucency at C4 on the left is likely degenerative. Metastatic disease is not excluded. Please correlate with findings on PET scan if used for further evaluation.   11/12/2017 Procedure   R tonsillar biopsy by Dr. Janace Hoard   11/12/2017 Pathology Results   Accession: [id number] Invasive squamous cell carcinoma; p16 diffusely positive   11/28/2017 Imaging   PET scan: 1. Right tonsillar fossa mass is intensely hypermetabolic compatible with primary neoplasm. 2. Solitary enlarged and hypermetabolic right level 2 cervical lymph node compatible with metastatic adenopathy. 3. No evidence for FDG avid hypermetabolic distant metastasis.   11/30/2017 Cancer Staging   Staging form: Pharynx - HPV-Mediated Oropharynx, AJCC 8th Edition - Clinical stage from 11/30/2017: Stage I (cT2, cN1, cM0, p16+) - Signed by Eppie Gibson, MD on 11/30/2017   12/07/2017 Miscellaneous   ENT evaluation to [facility] recommended against surgical resection due to the tumor extending into the right inferior nasopharynx and posterior soft palate, which would require resection of almost 1/2 of the soft palate and the right nasopharyngeal wall extending into right posterior/lateral posterior wall.    Procedure   12/26/2017: port and PEG tube placement    12/28/2017 - 02/14/2018 Chemotherapy   The patient had palonosetron (ALOXI) injection 0.25 mg, 0.25 mg, Intravenous,  Once, 2 of 3 cycles Administration: 0.25 mg (12/28/2017), 0.25 mg (01/25/2018) CISplatin (PLATINOL) 196 mg in sodium chloride 0.9 % 500 mL chemo infusion, 100 mg/m2 = 196 mg, Intravenous,  Once, 2 of 3 cycles Dose modification: 80 mg/m2 (original dose 100 mg/m2, Cycle 2, Reason: Dose  not tolerated, Comment: Neutropenia) Administration: 196 mg (12/28/2017), 157 mg (01/25/2018) fosaprepitant (EMEND) 150 mg, dexamethasone (DECADRON) 12 mg in sodium chloride 0.9 % 145 mL IVPB, , Intravenous,  Once, 2 of 3 cycles Administration:  (12/28/2017),  (01/25/2018)  for chemotherapy treatment.      REVIEW OF SYSTEMS:   Constitutional: ( - ) fevers, ( - )  chills , ( - ) night sweats Eyes: ( - ) blurriness of vision, ( - ) double vision, ( - ) watery eyes Ears, nose, mouth, throat, and face: ( - ) mucositis, ( + ) sore throat Respiratory: ( - ) cough, ( - ) dyspnea, ( - ) wheezes Cardiovascular: ( - ) palpitation, ( - ) chest discomfort, ( - ) lower extremity swelling Gastrointestinal:  ( - ) nausea, ( - ) heartburn, ( - ) change in bowel habits Skin: ( - ) abnormal skin rashes Lymphatics: ( - ) new lymphadenopathy, ( - ) easy bruising Neurological: ( - ) numbness, ( - ) tingling, ( - ) new weaknesses Behavioral/Psych: ( - ) mood change, ( - ) new changes  All other systems were reviewed with the patient and are negative.  I have reviewed the past medical history, past surgical history, social history and family history  with the patient and they are unchanged from previous note.  ALLERGIES:  has No Known Allergies.  MEDICATIONS:  Current Outpatient Medications  Medication Sig Dispense Refill  . Morphine Sulfate (MORPHINE CONCENTRATE) 10 mg / 0.5 ml concentrated solution Place 10 mg into feeding tube every 3 (three) hours as needed for moderate pain or severe pain.    . Nutritional Supplements (FEEDING SUPPLEMENT, OSMOLITE 1.5 CAL,) LIQD Begin 1.5 bottles Osmolite 1.5 via PEG QID with 60 mL free water before and after bolus feedings. Mix 30 mL Promod with 60 mL water BID and put into PEG. Flush with 30 mL water. Do not mix Promod with Osmolite 1.5 6 Bottle 0  . ondansetron (ZOFRAN) 8 MG tablet Take 8 mg by mouth every 8 (eight) hours as needed for nausea or vomiting.    . sodium  fluoride (PREVIDENT) 1.1 % GEL dental gel Place 1 application onto teeth at bedtime. Applied to to fresh. Brush teeth at bedtime for 2 minutes. Spit out excess. Do not rinse afterwards.     No current facility-administered medications for this visit.     PHYSICAL EXAMINATION: ECOG PERFORMANCE STATUS: 0 - Asymptomatic  Today's Vitals   10/22/18 1500  BP: 106/68  Pulse: 65  Resp: 17  SpO2: 100%  Weight: 148 lb 12.8 oz (67.5 kg)  Height: 5\' 10"  (1.778 m)  PainSc: 0-No pain   Body mass index is 21.35 kg/m.  Filed Weights   10/22/18 1500  Weight: 148 lb 12.8 oz (67.5 kg)    GENERAL: alert, no distress and comfortable SKIN: skin color, texture, turgor are normal, no rashes or significant lesions EYES: conjunctiva are pink and non-injected, sclera clear OROPHARYNX: no exudate, no erythema; lips, buccal mucosa, and tongue normal  NECK: supple, non-tender LYMPH:  no palpable lymphadenopathy in the cervical LUNGS: clear to auscultation with normal breathing effort HEART: regular rate & rhythm and no murmurs and no lower extremity edema ABDOMEN: soft, non-tender, non-distended, normal bowel sounds Musculoskeletal: no cyanosis of digits and no clubbing  PSYCH: alert & oriented x 3, fluent speech NEURO: no focal motor/sensory deficits  LABORATORY DATA:  I have reviewed the data as listed    Component Value Date/Time   NA 140 10/22/2018 1431   K 4.8 10/22/2018 1431   CL 104 10/22/2018 1431   CO2 31 10/22/2018 1431   GLUCOSE 89 10/22/2018 1431   BUN 13 10/22/2018 1431   CREATININE 1.08 10/22/2018 1431   CALCIUM 9.4 10/22/2018 1431   PROT 6.7 10/22/2018 1431   ALBUMIN 4.4 10/22/2018 1431   AST 14 (L) 10/22/2018 1431   ALT 12 10/22/2018 1431   ALKPHOS 57 10/22/2018 1431   BILITOT 0.5 10/22/2018 1431   GFRNONAA >60 10/22/2018 1431   GFRAA >60 10/22/2018 1431    No results found for: SPEP, UPEP  Lab Results  Component Value Date   WBC 3.2 (L) 10/22/2018   NEUTROABS 2.4  10/22/2018   HGB 13.7 10/22/2018   HCT 42.5 10/22/2018   MCV 88.7 10/22/2018   PLT 154 10/22/2018      Chemistry      Component Value Date/Time   NA 140 10/22/2018 1431   K 4.8 10/22/2018 1431   CL 104 10/22/2018 1431   CO2 31 10/22/2018 1431   BUN 13 10/22/2018 1431   CREATININE 1.08 10/22/2018 1431      Component Value Date/Time   CALCIUM 9.4 10/22/2018 1431   ALKPHOS 57 10/22/2018 1431   AST 14 (L) 10/22/2018  1431   ALT 12 10/22/2018 1431   BILITOT 0.5 10/22/2018 1431

## 2018-10-23 ENCOUNTER — Telehealth: Payer: Self-pay | Admitting: *Deleted

## 2018-10-23 LAB — TSH: TSH: 2.085 u[IU]/mL (ref 0.320–4.118)

## 2018-10-23 NOTE — Telephone Encounter (Signed)
Oncology Nurse Navigator Documentation  In follow-up to pt's post-tmt appt with Dr. Maylon Peppers yesterday and indication he has not had follow-up with ENT Constance Holster per this navigator's 3/5 faxed request for June appt, called [facility].  Spoke with Richardo Priest who requested patient call directly for appt.  I LVMM for Mr. Ledo with this guidance, asked him to return call to confirm message receipt.  Gayleen Orem, RN, BSN Head & Neck Oncology Nurse [place] [facility] [place] [phone]

## 2018-11-28 ENCOUNTER — Telehealth: Payer: Self-pay | Admitting: Hematology

## 2018-11-28 NOTE — Telephone Encounter (Signed)
Called and spoke with patient regarding appointments being moved from 11/5 due to provider PAL

## 2019-01-23 ENCOUNTER — Other Ambulatory Visit: Payer: 59

## 2019-01-23 ENCOUNTER — Ambulatory Visit: Payer: 59 | Admitting: Hematology

## 2019-01-28 ENCOUNTER — Other Ambulatory Visit: Payer: Self-pay

## 2019-01-28 ENCOUNTER — Inpatient Hospital Stay (HOSPITAL_BASED_OUTPATIENT_CLINIC_OR_DEPARTMENT_OTHER): Payer: 59 | Admitting: Hematology

## 2019-01-28 ENCOUNTER — Inpatient Hospital Stay: Payer: 59 | Attending: Hematology

## 2019-01-28 ENCOUNTER — Encounter: Payer: Self-pay | Admitting: Hematology

## 2019-01-28 VITALS — BP 107/61 | HR 78 | Temp 97.1°F | Resp 16 | Ht 70.0 in | Wt 155.0 lb

## 2019-01-28 DIAGNOSIS — Z9221 Personal history of antineoplastic chemotherapy: Secondary | ICD-10-CM | POA: Insufficient documentation

## 2019-01-28 DIAGNOSIS — D72829 Elevated white blood cell count, unspecified: Secondary | ICD-10-CM | POA: Diagnosis not present

## 2019-01-28 DIAGNOSIS — Z923 Personal history of irradiation: Secondary | ICD-10-CM | POA: Insufficient documentation

## 2019-01-28 DIAGNOSIS — Z931 Gastrostomy status: Secondary | ICD-10-CM | POA: Insufficient documentation

## 2019-01-28 DIAGNOSIS — Z79899 Other long term (current) drug therapy: Secondary | ICD-10-CM | POA: Diagnosis not present

## 2019-01-28 DIAGNOSIS — D72819 Decreased white blood cell count, unspecified: Secondary | ICD-10-CM

## 2019-01-28 DIAGNOSIS — R7989 Other specified abnormal findings of blood chemistry: Secondary | ICD-10-CM | POA: Diagnosis not present

## 2019-01-28 DIAGNOSIS — C090 Malignant neoplasm of tonsillar fossa: Secondary | ICD-10-CM

## 2019-01-28 DIAGNOSIS — R682 Dry mouth, unspecified: Secondary | ICD-10-CM | POA: Insufficient documentation

## 2019-01-28 LAB — CBC WITH DIFFERENTIAL (CANCER CENTER ONLY)
Abs Immature Granulocytes: 0.02 10*3/uL (ref 0.00–0.07)
Basophils Absolute: 0 10*3/uL (ref 0.0–0.1)
Basophils Relative: 0 %
Eosinophils Absolute: 0.1 10*3/uL (ref 0.0–0.5)
Eosinophils Relative: 2 %
HCT: 45.3 % (ref 39.0–52.0)
Hemoglobin: 14.7 g/dL (ref 13.0–17.0)
Immature Granulocytes: 1 %
Lymphocytes Relative: 9 %
Lymphs Abs: 0.3 10*3/uL — ABNORMAL LOW (ref 0.7–4.0)
MCH: 28.6 pg (ref 26.0–34.0)
MCHC: 32.5 g/dL (ref 30.0–36.0)
MCV: 88.1 fL (ref 80.0–100.0)
Monocytes Absolute: 0.3 10*3/uL (ref 0.1–1.0)
Monocytes Relative: 7 %
Neutro Abs: 2.7 10*3/uL (ref 1.7–7.7)
Neutrophils Relative %: 81 %
Platelet Count: 175 10*3/uL (ref 150–400)
RBC: 5.14 MIL/uL (ref 4.22–5.81)
RDW: 12.3 % (ref 11.5–15.5)
WBC Count: 3.4 10*3/uL — ABNORMAL LOW (ref 4.0–10.5)
nRBC: 0 % (ref 0.0–0.2)

## 2019-01-28 LAB — CMP (CANCER CENTER ONLY)
ALT: 19 U/L (ref 0–44)
AST: 17 U/L (ref 15–41)
Albumin: 4.8 g/dL (ref 3.5–5.0)
Alkaline Phosphatase: 49 U/L (ref 38–126)
Anion gap: 8 (ref 5–15)
BUN: 15 mg/dL (ref 6–20)
CO2: 32 mmol/L (ref 22–32)
Calcium: 9.9 mg/dL (ref 8.9–10.3)
Chloride: 105 mmol/L (ref 98–111)
Creatinine: 1.28 mg/dL — ABNORMAL HIGH (ref 0.61–1.24)
GFR, Est AFR Am: >60 mL/min (ref >60)
GFR, Estimated: >60 mL/min (ref >60)
Glucose, Bld: 88 mg/dL (ref 70–99)
Potassium: 4.1 mmol/L (ref 3.5–5.1)
Sodium: 145 mmol/L (ref 135–145)
Total Bilirubin: 0.6 mg/dL (ref 0.3–1.2)
Total Protein: 6.9 g/dL (ref 6.5–8.1)

## 2019-01-28 LAB — TSH: TSH: 2.406 u[IU]/mL (ref 0.320–4.118)

## 2019-01-28 LAB — T4, FREE: Free T4: 0.85 ng/dL (ref 0.61–1.12)

## 2019-01-28 NOTE — Progress Notes (Signed)
[facility] OFFICE PROGRESS NOTE  Patient Care Team: Martinique, Betty G, MD as PCP - General (Family Medicine) Eppie Gibson, MD as Attending Physician (Radiation Oncology) Tish Men, MD as Consulting Physician (Hematology) Leota Sauers, RN as Oncology Nurse Navigator (Oncology) Karie Mainland, RD as Dietitian (Nutrition) Valentino Saxon Perry Mount, CCC-SLP as Speech Language Pathologist (Speech Pathology) Wynelle Beckmann, Melodie Bouillon, PT as Physical Therapist (Physical Therapy)  HEME/ONC OVERVIEW: 1. 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 Constance Holster unremarkable in 10/2018; next scheduled on 02/11/2019  -Surveillance plan:  -q2-574month x 1 year, then q410monthx 1 year, then q6m83month 3 years  -q3-74mo14monthroid function monitoring  -Fiberoptic exam by ENT -Periodic TSH monitoring  -I counseled the patient on importance of jaw and shoulder exercises, as well as speech therapy exercises to maintain swallow function   Leukopenia -Likely due to mild marrow suppression from hx of chemotherapy -WBC 3.4k today, improving  -Clinically, patient denies any symptoms of infection -We will monitor it for now   Elevated creatinine -Cr 1.28 today, mildly elevated; new -Electrolytes normal -I counseled the patient on the importance of  maintaining adequate hydration, especially in light of previous exposure to nephrotoxic chemotherapy  -Patient expressed understanding, and agreed with the plan   Orders Placed This Encounter  Procedures  . CBC w/ diff    Standing Status:   Future    Standing Expiration Date:   03/03/2020  . CMP    Standing Status:   Future    Standing Expiration Date:   03/03/2020  . TSH    Standing Status:   Future    Standing Expiration Date:   01/28/2020   All questions were answered. The patient knows to call the clinic with any problems, questions or concerns. No barriers to learning was detected.  Return in 4 months for labs and clinic appt.  Thomas Mccall 01/28/2019 10:38 AM  CHIEF COMPLAINT: "I am doing pretty good"  INTERVAL HISTORY: Thomas Mccall clinic for follow-up of squamous cell carcinoma of the right tonsil.  Patient reports that he has been doing well since his last visit overall.  He has some periodic dry mouth, and tries to avoid food that is high in starch, such as bagels, but he is otherwise able to eat and drink without any difficulty.  He recently met with his dentist, and is being evaluated for possible dental implant.  He had fiberoptic evaluation by Dr. RoseConstance HolsterAugust 2020, which was unremarkable.  His next appointment is on February 11, 2019.  He denies any other complaint today.  REVIEW OF SYSTEMS:   Constitutional: ( - ) fevers, ( - )  chills , ( - ) night sweats Eyes: ( - ) blurriness of vision, ( - ) double vision, ( - ) watery eyes Ears, nose, mouth, throat, and face: ( - ) mucositis, ( - ) sore  throat Respiratory: ( - ) cough, ( - ) dyspnea, ( - ) wheezes Cardiovascular: ( - ) palpitation, ( - ) chest discomfort, ( - ) lower extremity swelling Gastrointestinal:  ( - ) nausea, ( - ) heartburn, ( - ) change in bowel habits Skin: ( - ) abnormal skin rashes Lymphatics: ( - ) new lymphadenopathy, ( - ) easy bruising Neurological: ( - ) numbness, ( - ) tingling, (  - ) new weaknesses Behavioral/Psych: ( - ) mood change, ( - ) new changes  All other systems were reviewed with the patient and are negative.  SUMMARY OF ONCOLOGIC HISTORY: Oncology History  Malignant neoplasm of tonsillar fossa ([place])  10/30/2017 Miscellaneous   Presented to Dr. Redmond Baseman with 3 months of R neck mass   11/07/2017 Imaging   CT neck w/ contrast:  1. Right tonsillar mass measures up to 2.6 cm, highly concerning for a squamous cell carcinoma. 2. Enlarged homogeneous right level 2 lymph node measures up to 3.9 cm, consistent with metastatic disease. 3. Adjacent right level 3 lymph node measures 1.1 cm. 4. Lucency at C4 on the left is likely degenerative. Metastatic disease is not excluded. Please correlate with findings on PET scan if used for further evaluation.   11/12/2017 Procedure   R tonsillar biopsy by Dr. Janace Hoard   11/12/2017 Pathology Results   Accession: [id number] Invasive squamous cell carcinoma; p16 diffusely positive   11/28/2017 Imaging   PET scan: 1. Right tonsillar fossa mass is intensely hypermetabolic compatible with primary neoplasm. 2. Solitary enlarged and hypermetabolic right level 2 cervical lymph node compatible with metastatic adenopathy. 3. No evidence for FDG avid hypermetabolic distant metastasis.   11/30/2017 Cancer Staging   Staging form: Pharynx - HPV-Mediated Oropharynx, AJCC 8th Edition - Clinical stage from 11/30/2017: Stage I (cT2, cN1, cM0, p16+) - Signed by Eppie Gibson, MD on 11/30/2017   12/07/2017 Miscellaneous   ENT evaluation to [facility] recommended against surgical resection due to the tumor extending into the right inferior nasopharynx and posterior soft palate, which would require resection of almost 1/2 of the soft palate and the right nasopharyngeal wall extending into right posterior/lateral posterior wall.    Procedure   12/26/2017: port and PEG tube placement    12/28/2017 - 02/14/2018 Chemotherapy   The patient had  palonosetron (ALOXI) injection 0.25 mg, 0.25 mg, Intravenous,  Once, 2 of 3 cycles Administration: 0.25 mg (12/28/2017), 0.25 mg (01/25/2018) CISplatin (PLATINOL) 196 mg in sodium chloride 0.9 % 500 mL chemo infusion, 100 mg/m2 = 196 mg, Intravenous,  Once, 2 of 3 cycles Dose modification: 80 mg/m2 (original dose 100 mg/m2, Cycle 2, Reason: Dose not tolerated, Comment: Neutropenia) Administration: 196 mg (12/28/2017), 157 mg (01/25/2018) fosaprepitant (EMEND) 150 mg, dexamethasone (DECADRON) 12 mg in sodium chloride 0.9 % 145 mL IVPB, , Intravenous,  Once, 2 of 3 cycles Administration:  (12/28/2017),  (01/25/2018)  for chemotherapy treatment.      I have reviewed the past medical history, past surgical history, social history and family history with the patient and they are unchanged from previous note.  ALLERGIES:  has No Known Allergies.  MEDICATIONS:  Current Outpatient Medications  Medication Sig Dispense Refill  . sodium fluoride (PREVIDENT) 1.1 % GEL dental gel Place 1 application onto teeth at bedtime. Applied to to fresh. Brush teeth at bedtime for 2 minutes. Spit out excess. Do not rinse afterwards.     No current facility-administered medications for this visit.     PHYSICAL EXAMINATION: ECOG  PERFORMANCE STATUS: 0 - Asymptomatic  Today's Vitals   01/28/19 1000  BP: 107/61  Pulse: 78  Resp: 16  Temp: (!) 97.1 F (36.2 C)  TempSrc: Temporal  SpO2: 99%  Weight: 155 lb 0.6 oz (70.3 kg)  Height: '5\' 10"'$  (1.778 m)  PainSc: 0-No pain   Body mass index is 22.25 kg/m.  Filed Weights   01/28/19 1000  Weight: 155 lb 0.6 oz (70.3 kg)    GENERAL: alert, no distress and comfortable SKIN: skin color, texture, turgor are normal, no rashes or significant lesions EYES: conjunctiva are pink and non-injected, sclera clear OROPHARYNX: no exudate, no erythema; lips, buccal mucosa, and tongue normal  NECK: supple, non-tender LYMPH:  no palpable lymphadenopathy in the  cervical LUNGS: clear to auscultation with normal breathing effort HEART: regular rate & rhythm and no murmurs and no lower extremity edema ABDOMEN: soft, non-tender, non-distended, normal bowel sounds Musculoskeletal: no cyanosis of digits and no clubbing  PSYCH: alert & oriented x 3, fluent speech NEURO: no focal motor/sensory deficits  LABORATORY DATA:  I have reviewed the data as listed    Component Value Date/Time   NA 145 01/28/2019 0943   K 4.1 01/28/2019 0943   CL 105 01/28/2019 0943   CO2 32 01/28/2019 0943   GLUCOSE 88 01/28/2019 0943   BUN 15 01/28/2019 0943   CREATININE 1.28 (H) 01/28/2019 0943   CALCIUM 9.9 01/28/2019 0943   PROT 6.9 01/28/2019 0943   ALBUMIN 4.8 01/28/2019 0943   AST 17 01/28/2019 0943   ALT 19 01/28/2019 0943   ALKPHOS 49 01/28/2019 0943   BILITOT 0.6 01/28/2019 0943   GFRNONAA >60 01/28/2019 0943   GFRAA >60 01/28/2019 0943    No results found for: SPEP, UPEP  Lab Results  Component Value Date   WBC 3.4 (L) 01/28/2019   NEUTROABS 2.7 01/28/2019   HGB 14.7 01/28/2019   HCT 45.3 01/28/2019   MCV 88.1 01/28/2019   PLT 175 01/28/2019      Chemistry      Component Value Date/Time   NA 145 01/28/2019 0943   K 4.1 01/28/2019 0943   CL 105 01/28/2019 0943   CO2 32 01/28/2019 0943   BUN 15 01/28/2019 0943   CREATININE 1.28 (H) 01/28/2019 0943      Component Value Date/Time   CALCIUM 9.9 01/28/2019 0943   ALKPHOS 49 01/28/2019 0943   AST 17 01/28/2019 0943   ALT 19 01/28/2019 0943   BILITOT 0.6 01/28/2019 0943       RADIOGRAPHIC STUDIES: I have personally reviewed the radiological images as listed below and agreed with the findings in the report. No results found.

## 2019-05-05 DIAGNOSIS — C4492 Squamous cell carcinoma of skin, unspecified: Secondary | ICD-10-CM | POA: Diagnosis not present

## 2019-05-05 DIAGNOSIS — R0683 Snoring: Secondary | ICD-10-CM | POA: Diagnosis not present

## 2019-05-05 DIAGNOSIS — R682 Dry mouth, unspecified: Secondary | ICD-10-CM | POA: Diagnosis not present

## 2019-05-21 ENCOUNTER — Ambulatory Visit
Admission: RE | Admit: 2019-05-21 | Discharge: 2019-05-21 | Disposition: A | Discharge status: Home / Self Care | Payer: 59 | Source: Ambulatory Visit | Attending: Radiation Oncology | Admitting: Radiation Oncology

## 2019-05-21 ENCOUNTER — Encounter: Payer: Self-pay | Admitting: Radiation Oncology

## 2019-05-21 ENCOUNTER — Other Ambulatory Visit: Payer: Self-pay

## 2019-05-21 DIAGNOSIS — C090 Malignant neoplasm of tonsillar fossa: Secondary | ICD-10-CM

## 2019-05-21 DIAGNOSIS — Z85818 Personal history of malignant neoplasm of other sites of lip, oral cavity, and pharynx: Secondary | ICD-10-CM | POA: Diagnosis not present

## 2019-05-21 DIAGNOSIS — Z85819 Personal history of malignant neoplasm of unspecified site of lip, oral cavity, and pharynx: Secondary | ICD-10-CM | POA: Insufficient documentation

## 2019-05-21 DIAGNOSIS — Z08 Encounter for follow-up examination after completed treatment for malignant neoplasm: Secondary | ICD-10-CM | POA: Diagnosis not present

## 2019-05-21 NOTE — Progress Notes (Signed)
[name] presents for follow up of radiation completed 02/13/2018 to his right tonsil and bilateral neck.   Pain issues, if any: He denies Using a feeding tube?: removed 08/2018 Weight changes, if any:  Wt Readings from Last 3 Encounters:  05/21/19 163 lb 4 oz (74 kg)  01/28/19 155 lb 0.6 oz (70.3 kg)  10/22/18 148 lb 12.8 oz (67.5 kg)   Swallowing issues, if any: He denies difficulty swallowing, but needs water to swallow his food.  Smoking or chewing tobacco? No Using fluoride trays daily? He is using prevident toothpaste.  Last ENT visit was on: 05/05/19 Dr. Constance Holster Other notable issues, if any:  He saw Dr. Maylon Peppers on 01/28/19 and will see him again on 05/27/19.  He is feeling well today.   BP 127/85 (BP Location: Left Arm, Patient Position: Sitting)   Pulse 78   Temp 97.7 F (36.5 C) (Temporal)   Resp 18   Ht 5\' 10"  (1.778 m)   Wt 163 lb 4 oz (74 kg)   SpO2 99%   BMI 23.42 kg/m

## 2019-05-21 NOTE — Progress Notes (Signed)
Radiation Oncology         [phone] ________________________________  Name: Thomas Mccall MRN: [id number]  Date: 05/21/2019  DOB: May 25, 1960  Follow-Up Visit Note  CC: Martinique, Betty G, MD  Melissa Montane, MD  Diagnosis and Prior Radiotherapy:       ICD-10-CM   1. Malignant neoplasm of tonsillar fossa ([place])  C09.0 Amb Referral to Survivorship Long term  Cancer Staging Malignant neoplasm of tonsillar fossa ([place]) Staging form: Pharynx - HPV-Mediated Oropharynx, AJCC 8th Edition - Clinical stage from 11/30/2017: Stage I (cT2, cN1, cM0, p16+) - Signed by Eppie Gibson, MD on 11/30/2017   CHIEF COMPLAINT:  Here for follow-up and surveillance of p16 positive right tonsil cancer  Radiation treatment dates:   12/27/2017 - 02/13/2018  Site/dose:   Right tonsil and bilateral neck / 70 Gy in 35 fractions (total dose)  Narrative:  The patient returns today for routine follow-up.  He recently saw Dr. Constance Holster due to issues of snoring but was reassured that this is not related to any sign of disease recurrence. Pain issues, if any: He denies Using a feeding tube?: removed 08/2018 Weight changes, if any: Gaining Wt Readings from Last 3 Encounters:  05/21/19 163 lb 4 oz (74 kg)  01/28/19 155 lb 0.6 oz (70.3 kg)  10/22/18 148 lb 12.8 oz (67.5 kg)   Swallowing issues, if any: He denies difficulty swallowing, but needs water to swallow his food.  Smoking or chewing tobacco? No Using fluoride trays daily? He is using prevident toothpaste.  Last ENT visit was on: 05/05/19 Dr. Constance Holster Other notable issues, if any:  He saw Dr. Maylon Peppers on 01/28/19 and will see him again on 05/27/19.  He is feeling well today.  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  Denies lymphedema.  Feels some mild tightness in his soft tissues of his neck.  Still copes with dry mouth but this is alleviated by sipping water frequently.  ALLERGIES:  has No Known Allergies.  Meds: Current Outpatient Medications  Medication Sig Dispense  Refill  . sodium fluoride (PREVIDENT) 1.1 % GEL dental gel Place 1 application onto teeth at bedtime. Applied to to fresh. Brush teeth at bedtime for 2 minutes. Spit out excess. Do not rinse afterwards.     No current facility-administered medications for this encounter.    Physical Findings: The patient is in no acute distress. Patient is alert and oriented. Wt Readings from Last 3 Encounters:  05/21/19 163 lb 4 oz (74 kg)  01/28/19 155 lb 0.6 oz (70.3 kg)  10/22/18 148 lb 12.8 oz (67.5 kg)    height is 5\' 10"  (1.778 m) and weight is 163 lb 4 oz (74 kg). His temporal temperature is 97.7 F (36.5 C). His blood pressure is 127/85 and his pulse is 78. His respiration is 18 and oxygen saturation is 99%. .  General: Alert and oriented, in no acute distress.  Well-appearing HEENT: Head is normocephalic. Extraocular movements are intact.  No tumor in upper throat.  Oral mucosa without thrush or lesions  Neck: no palpable masses, no lymphedema Skin: Skin in treatment fields shows satisfactory healing Lymphatics: see Neck Exam Psychiatric: Judgment and insight are intact. Affect is appropriate.   Lab Findings: Lab Results  Component Value Date   WBC 3.4 (L) 01/28/2019   HGB 14.7 01/28/2019   HCT 45.3 01/28/2019   MCV 88.1 01/28/2019   PLT 175 01/28/2019    Lab Results  Component Value Date   TSH 2.406  01/28/2019    Radiographic Findings: No results found.  Impression/Plan:  Right Tonsil Squamous Cell Carcinoma  1) Head and Neck Cancer Status: NED  2) Nutritional Status: Gaining weight, no issues  3) Swallowing: No issues, continue recommendations from speech-language pathology  4) Dental: Encouraged to continue regular followup with dentistry, and dental hygiene including fluoride rinses.  Scheduled for check up soon.  He is also going to have an implant placed to a left maxillary molar bed and I have reviewed his plan and informed Dr. Enrique Sack /  Dr. Constance Holster that this tooth  root received under [address]  5) Thyroid function: Within normal limits, continue annual testing with medical oncology Lab Results  Component Value Date   TSH 2.406 01/28/2019    6)  Follow-up with medical oncology and ENT.  Given that he is doing so well I will refer him to long-term survivorship with Sandi Mealy (spoke extensively with patient about our survivorship program and he is enthusiastic about this).  I will see him back on an as-needed basis and he knows not to hesitate to call me if he ever needs to be worked back into my schedule.  We discussed measures to reduce the risk of infection during the COVID-19 pandemic.  We discussed the vaccine, and how to schedule this when he is eligible.   On date of service, in total, I spent 25 minutes on this encounter;l he was seen in person. _   Eppie Gibson, MD  This document serves as a record of services personally performed by Eppie Gibson, MD. It was created on her behalf by Steva Colder, a trained medical scribe. The creation of this record is based on the scribe's personal observations and the provider's statements to them. This document has been checked and approved by the attending provider.

## 2019-05-23 ENCOUNTER — Encounter: Payer: Self-pay | Admitting: Radiation Oncology

## 2019-05-27 ENCOUNTER — Encounter: Payer: Self-pay | Admitting: Hematology

## 2019-05-27 ENCOUNTER — Inpatient Hospital Stay (HOSPITAL_BASED_OUTPATIENT_CLINIC_OR_DEPARTMENT_OTHER): Payer: 59 | Admitting: Hematology

## 2019-05-27 ENCOUNTER — Other Ambulatory Visit: Payer: Self-pay

## 2019-05-27 ENCOUNTER — Inpatient Hospital Stay: Payer: 59 | Attending: Hematology

## 2019-05-27 ENCOUNTER — Telehealth: Payer: Self-pay | Admitting: Hematology

## 2019-05-27 VITALS — BP 124/74 | HR 68 | Temp 97.3°F | Resp 18 | Ht 70.0 in | Wt 160.8 lb

## 2019-05-27 DIAGNOSIS — Z85818 Personal history of malignant neoplasm of other sites of lip, oral cavity, and pharynx: Secondary | ICD-10-CM | POA: Diagnosis not present

## 2019-05-27 DIAGNOSIS — Z79899 Other long term (current) drug therapy: Secondary | ICD-10-CM | POA: Insufficient documentation

## 2019-05-27 DIAGNOSIS — Z923 Personal history of irradiation: Secondary | ICD-10-CM | POA: Diagnosis not present

## 2019-05-27 DIAGNOSIS — D72819 Decreased white blood cell count, unspecified: Secondary | ICD-10-CM

## 2019-05-27 DIAGNOSIS — Z9221 Personal history of antineoplastic chemotherapy: Secondary | ICD-10-CM | POA: Diagnosis not present

## 2019-05-27 DIAGNOSIS — C090 Malignant neoplasm of tonsillar fossa: Secondary | ICD-10-CM | POA: Diagnosis not present

## 2019-05-27 LAB — CBC WITH DIFFERENTIAL (CANCER CENTER ONLY)
Abs Immature Granulocytes: 0.01 10*3/uL (ref 0.00–0.07)
Basophils Absolute: 0 10*3/uL (ref 0.0–0.1)
Basophils Relative: 1 %
Eosinophils Absolute: 0.1 10*3/uL (ref 0.0–0.5)
Eosinophils Relative: 2 %
HCT: 44.2 % (ref 39.0–52.0)
Hemoglobin: 14.4 g/dL (ref 13.0–17.0)
Immature Granulocytes: 0 %
Lymphocytes Relative: 12 %
Lymphs Abs: 0.4 10*3/uL — ABNORMAL LOW (ref 0.7–4.0)
MCH: 28.6 pg (ref 26.0–34.0)
MCHC: 32.6 g/dL (ref 30.0–36.0)
MCV: 87.9 fL (ref 80.0–100.0)
Monocytes Absolute: 0.3 10*3/uL (ref 0.1–1.0)
Monocytes Relative: 9 %
Neutro Abs: 2.8 10*3/uL (ref 1.7–7.7)
Neutrophils Relative %: 76 %
Platelet Count: 172 10*3/uL (ref 150–400)
RBC: 5.03 MIL/uL (ref 4.22–5.81)
RDW: 12.5 % (ref 11.5–15.5)
WBC Count: 3.6 10*3/uL — ABNORMAL LOW (ref 4.0–10.5)
nRBC: 0 % (ref 0.0–0.2)

## 2019-05-27 LAB — TSH: TSH: 2.12 u[IU]/mL (ref 0.320–4.118)

## 2019-05-27 LAB — CMP (CANCER CENTER ONLY)
ALT: 14 U/L (ref 0–44)
AST: 14 U/L — ABNORMAL LOW (ref 15–41)
Albumin: 4.7 g/dL (ref 3.5–5.0)
Alkaline Phosphatase: 50 U/L (ref 38–126)
Anion gap: 5 (ref 5–15)
BUN: 15 mg/dL (ref 6–20)
CO2: 33 mmol/L — ABNORMAL HIGH (ref 22–32)
Calcium: 9.9 mg/dL (ref 8.9–10.3)
Chloride: 105 mmol/L (ref 98–111)
Creatinine: 1.3 mg/dL — ABNORMAL HIGH (ref 0.61–1.24)
GFR, Est AFR Am: >60 mL/min (ref >60)
GFR, Estimated: 59 mL/min — ABNORMAL LOW (ref >60)
Glucose, Bld: 102 mg/dL — ABNORMAL HIGH (ref 70–99)
Potassium: 4.1 mmol/L (ref 3.5–5.1)
Sodium: 143 mmol/L (ref 135–145)
Total Bilirubin: 0.5 mg/dL (ref 0.3–1.2)
Total Protein: 6.9 g/dL (ref 6.5–8.1)

## 2019-05-27 NOTE — Telephone Encounter (Signed)
Lab and clinic appt in 6 months with Sandi Mealy at [facility] and neck survivorship clinic In Basket message was sent to Wilmore X at [facility] for scheduling / per 3/9 los

## 2019-05-27 NOTE — Progress Notes (Signed)
[facility] OFFICE PROGRESS NOTE  Patient Care Team: Martinique, Betty G, MD as PCP - General (Family Medicine) Eppie Gibson, MD as Attending Physician (Radiation Oncology) Tish Men, MD as Consulting Physician (Hematology) Karie Mainland, RD as Dietitian (Nutrition) Valentino Saxon [place], CCC-SLP as Speech Language Pathologist (Speech Pathology) Wynelle Beckmann, Melodie Bouillon, PT as Physical Therapist (Physical Therapy) Izora Gala, MD as Consulting Physician (Otolaryngology)  HEME/ONC OVERVIEW: 1. 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, p16+; currently NED  -12/2017 - 01/2018: definitive chemoradiation with high-dose cisplatin x 2 doses   05/2018: post-treatment PET NED  -05/2018 - present: surveillance   TREATMENT REGIMEN:  12/27/2017 - 02/13/2018: definitive chemoradiation with high-dose cisplatin 100mg /m2 x 2  05/2018 - present: surveillance   ASSESSMENT & PLAN:   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, p16+ -S/p definitive chemoradiation with high-dose cisplatin; NED on PET  -No evidence of disease recurrence on exam today  -Fiberoptic exam with Dr. Constance Holster unremarkable in 05/2019; next scheduled om 07/23/2019   -Surveillance plan:  -q2-45months x 1 year, then q35months x 1 year, then q68months x 3 years  -q3-36month thyroid function monitoring  -Fiberoptic exam by ENT -Periodic TSH monitoring  -I counseled the patient on importance of jaw and shoulder exercises, as well as speech therapy exercises to maintain swallow function   Leukopenia -Likely due to mild marrow suppression from hx of chemotherapy -WBC 3.6k today, stable -Clinically, patient denies any symptoms of infection -I counseled the patient on the importance of keep up-to-date with vaccinations, including influenza and Covid   Orders Placed This Encounter  Procedures  . CBC with Differential (Cancer Center Only)    Standing Status:   Future    Standing Expiration  Date:   06/30/2020  . CMP ([facility] only)    Standing Status:   Future    Standing Expiration Date:   06/30/2020  . TSH    Standing Status:   Future    Standing Expiration Date:   05/26/2020   The total time spent in the encounter was 31 minutes, including face-to-face time with the patient, review of various tests results, order additional studies/medications, documentation, and coordination of care plan.   All questions were answered. The patient knows to call the clinic with any problems, questions or concerns. No barriers to learning was detected.  Return in 6 months for labs and clinic follow-up with H&[facility] survivorship clinic.   Tish Men, MD 3/9/202110:19 AM  CHIEF COMPLAINT: "I am doing fine"  INTERVAL HISTORY: Mr. Siglin returns to clinic for follow-up of squamous cell carcinoma of the right tonsil on surveillance.  Patient reports that he underwent ENT exam in 05/2019, which did not show any evidence of recurrent disease.  He is able to eat and drink without any limitation, except certain types of food that contains high starch content, such as bagels.  He denies any other complaints today.  REVIEW OF SYSTEMS:   Constitutional: ( - ) fevers, ( - )  chills , ( - ) night sweats Eyes: ( - ) blurriness of vision, ( - ) double vision, ( - ) watery eyes Ears, nose, mouth, throat, and face: ( - ) mucositis, ( - ) sore throat Respiratory: ( - ) cough, ( - ) dyspnea, ( - ) wheezes Cardiovascular: ( - ) palpitation, ( - ) chest discomfort, ( - ) lower extremity swelling Gastrointestinal:  ( - ) nausea, ( - ) heartburn, ( - )  change in bowel habits Skin: ( - ) abnormal skin rashes Lymphatics: ( - ) new lymphadenopathy, ( - ) easy bruising Neurological: ( - ) numbness, ( - ) tingling, ( - ) new weaknesses Behavioral/Psych: ( - ) mood change, ( - ) new changes  All other systems were reviewed with the patient and are negative.  SUMMARY OF ONCOLOGIC HISTORY: Oncology History  Malignant  neoplasm of tonsillar fossa ([place])  10/30/2017 Miscellaneous   Presented to Dr. Redmond Baseman with 3 months of R neck mass   11/07/2017 Imaging   CT neck w/ contrast:  1. Right tonsillar mass measures up to 2.6 cm, highly concerning for a squamous cell carcinoma. 2. Enlarged homogeneous right level 2 lymph node measures up to 3.9 cm, consistent with metastatic disease. 3. Adjacent right level 3 lymph node measures 1.1 cm. 4. Lucency at C4 on the left is likely degenerative. Metastatic disease is not excluded. Please correlate with findings on PET scan if used for further evaluation.   11/12/2017 Procedure   R tonsillar biopsy by Dr. Janace Hoard   11/12/2017 Pathology Results   Accession: [id number] Invasive squamous cell carcinoma; p16 diffusely positive   11/28/2017 Imaging   PET scan: 1. Right tonsillar fossa mass is intensely hypermetabolic compatible with primary neoplasm. 2. Solitary enlarged and hypermetabolic right level 2 cervical lymph node compatible with metastatic adenopathy. 3. No evidence for FDG avid hypermetabolic distant metastasis.   11/30/2017 Cancer Staging   Staging form: Pharynx - HPV-Mediated Oropharynx, AJCC 8th Edition - Clinical stage from 11/30/2017: Stage I (cT2, cN1, cM0, p16+) - Signed by Eppie Gibson, MD on 11/30/2017   12/07/2017 Miscellaneous   ENT evaluation to [facility] recommended against surgical resection due to the tumor extending into the right inferior nasopharynx and posterior soft palate, which would require resection of almost 1/2 of the soft palate and the right nasopharyngeal wall extending into right posterior/lateral posterior wall.    Procedure   12/26/2017: port and PEG tube placement    12/28/2017 - 02/14/2018 Chemotherapy   The patient had palonosetron (ALOXI) injection 0.25 mg, 0.25 mg, Intravenous,  Once, 2 of 3 cycles Administration: 0.25 mg (12/28/2017), 0.25 mg (01/25/2018) CISplatin (PLATINOL) 196 mg in sodium chloride 0.9 % 500 mL chemo  infusion, 100 mg/m2 = 196 mg, Intravenous,  Once, 2 of 3 cycles Dose modification: 80 mg/m2 (original dose 100 mg/m2, Cycle 2, Reason: Dose not tolerated, Comment: Neutropenia) Administration: 196 mg (12/28/2017), 157 mg (01/25/2018) fosaprepitant (EMEND) 150 mg, dexamethasone (DECADRON) 12 mg in sodium chloride 0.9 % 145 mL IVPB, , Intravenous,  Once, 2 of 3 cycles Administration:  (12/28/2017),  (01/25/2018)  for chemotherapy treatment.      I have reviewed the past medical history, past surgical history, social history and family history with the patient and they are unchanged from previous note.  ALLERGIES:  has No Known Allergies.  MEDICATIONS:  Current Outpatient Medications  Medication Sig Dispense Refill  . sodium fluoride (PREVIDENT) 1.1 % GEL dental gel Place 1 application onto teeth at bedtime. Applied to to fresh. Brush teeth at bedtime for 2 minutes. Spit out excess. Do not rinse afterwards.     No current facility-administered medications for this visit.    PHYSICAL EXAMINATION: ECOG PERFORMANCE STATUS: 0 - Asymptomatic  Today's Vitals   05/27/19 0955  BP: 124/74  Pulse: 68  Resp: 18  Temp: (!) 97.3 F (36.3 C)  TempSrc: Temporal  SpO2: 100%  Weight: 160 lb 12.8 oz (72.9 kg)  Height:  5\' 10"  (1.778 m)  PainSc: 0-No pain   Body mass index is 23.07 kg/m.  Filed Weights   05/27/19 0955  Weight: 160 lb 12.8 oz (72.9 kg)    GENERAL: alert, no distress and comfortable SKIN: skin color, texture, turgor are normal, no rashes or significant lesions EYES: conjunctiva are pink and non-injected, sclera clear OROPHARYNX: no exudate, no erythema; lips, buccal mucosa, and tongue normal  NECK: supple, non-tender LYMPH:  no palpable lymphadenopathy in the cervical LUNGS: clear to auscultation with normal breathing effort HEART: regular rate & rhythm and no murmurs and no lower extremity edema ABDOMEN: soft, non-tender, non-distended, normal bowel sounds Musculoskeletal:  no cyanosis of digits and no clubbing  PSYCH: alert & oriented x 3, fluent speech  LABORATORY DATA:  I have reviewed the data as listed    Component Value Date/Time   NA 143 05/27/2019 0938   K 4.1 05/27/2019 0938   CL 105 05/27/2019 0938   CO2 33 (H) 05/27/2019 0938   GLUCOSE 102 (H) 05/27/2019 0938   BUN 15 05/27/2019 0938   CREATININE 1.30 (H) 05/27/2019 0938   CALCIUM 9.9 05/27/2019 0938   PROT 6.9 05/27/2019 0938   ALBUMIN 4.7 05/27/2019 0938   AST 14 (L) 05/27/2019 0938   ALT 14 05/27/2019 0938   ALKPHOS 50 05/27/2019 0938   BILITOT 0.5 05/27/2019 0938   GFRNONAA 59 (L) 05/27/2019 0938   GFRAA >60 05/27/2019 0938    No results found for: SPEP, UPEP  Lab Results  Component Value Date   WBC 3.6 (L) 05/27/2019   NEUTROABS 2.8 05/27/2019   HGB 14.4 05/27/2019   HCT 44.2 05/27/2019   MCV 87.9 05/27/2019   PLT 172 05/27/2019      Chemistry      Component Value Date/Time   NA 143 05/27/2019 0938   K 4.1 05/27/2019 0938   CL 105 05/27/2019 0938   CO2 33 (H) 05/27/2019 0938   BUN 15 05/27/2019 0938   CREATININE 1.30 (H) 05/27/2019 0938      Component Value Date/Time   CALCIUM 9.9 05/27/2019 0938   ALKPHOS 50 05/27/2019 0938   AST 14 (L) 05/27/2019 0938   ALT 14 05/27/2019 0938   BILITOT 0.5 05/27/2019 0938       RADIOGRAPHIC STUDIES: I have personally reviewed the radiological images as listed below and agreed with the findings in the report. No results found.

## 2019-05-28 ENCOUNTER — Telehealth: Payer: Self-pay | Admitting: Hematology

## 2019-05-28 NOTE — Telephone Encounter (Signed)
Scheduled appt per 3/4 sch message - mailed letter with appt date and time

## 2019-09-09 IMAGING — XA IR PERC PLACEMENT GASTROSTOMY
2 series · 10 of 10 positions shown · non-contrast
Comparison: PET-CT-11/28/2017

INDICATION: History of tonsillar cancer, in need of gastrostomy tube placement
prior to the initiation of chemoradiation.

EXAM:
PULL TROUGH GASTROSTOMY TUBE PLACEMENT

[Series 1: care single · 1 of 1 slices shown]
[im 1/1]
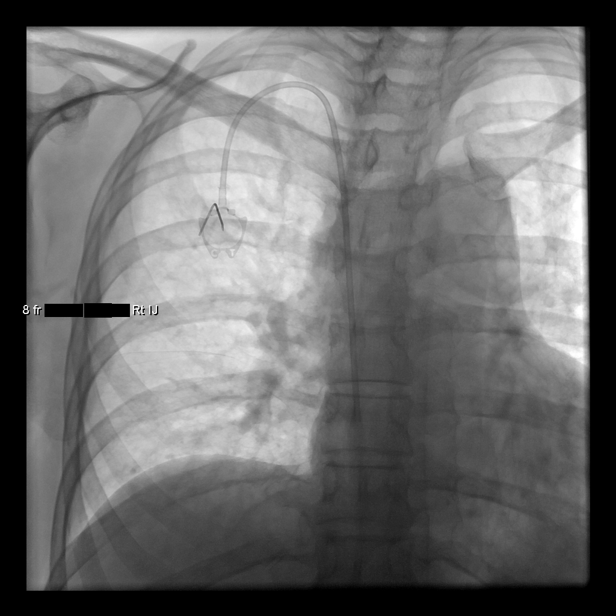

[Series 300: ir imaging guided port insertion · 9 of 9 slices shown]
[im 1/9]
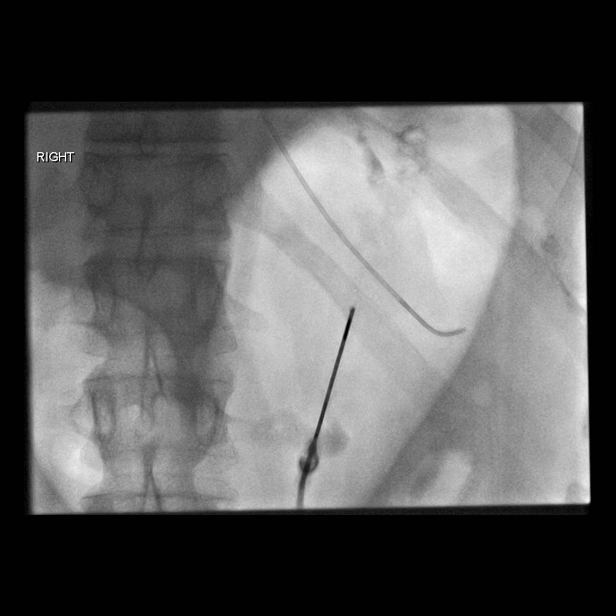
[im 2/9]
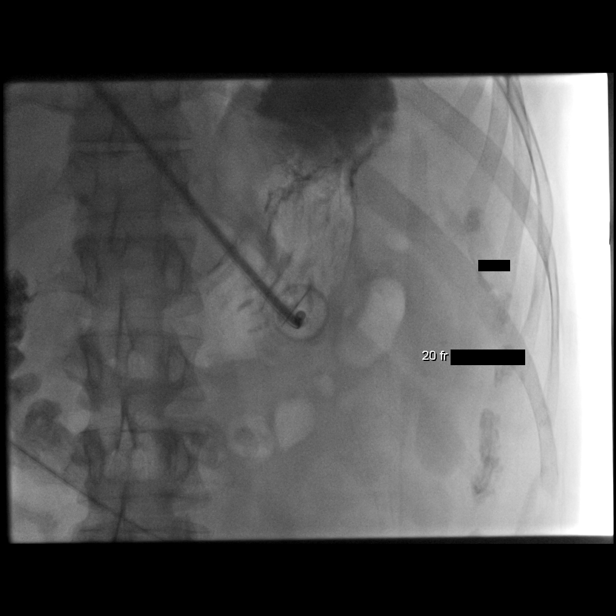
[im 3/9]
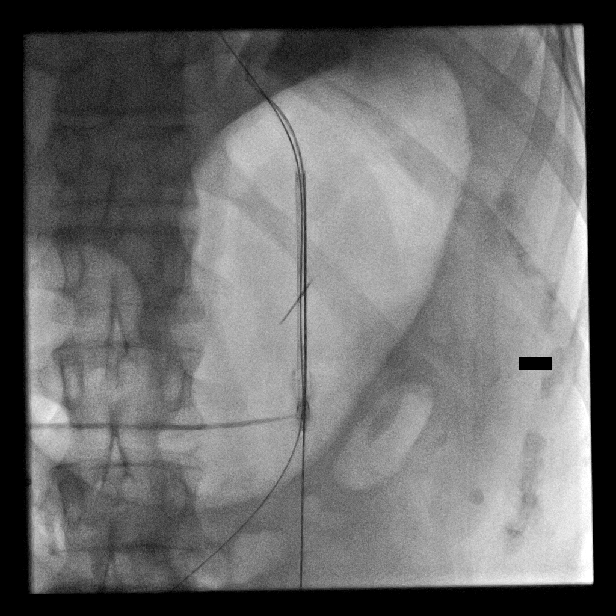
[im 4/9]
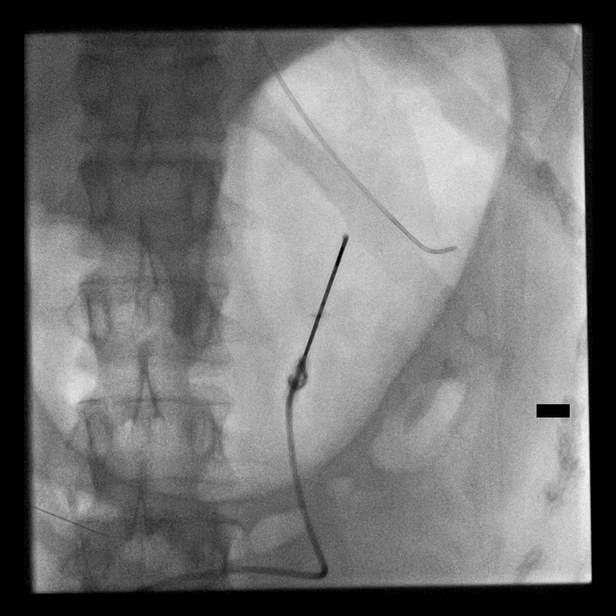
[im 5/9]
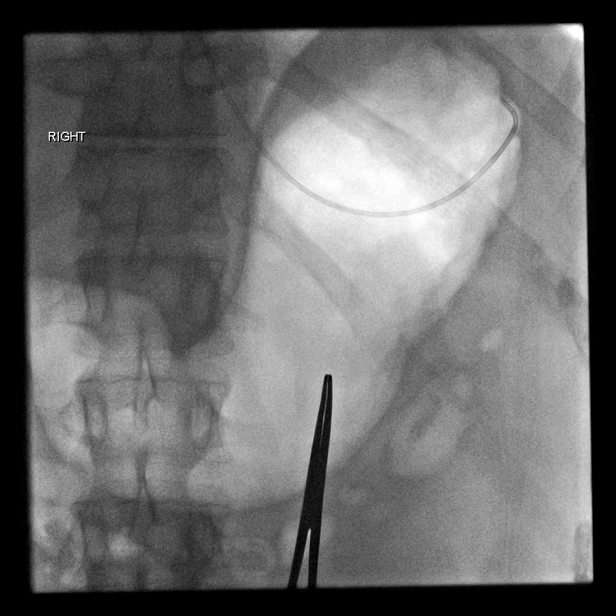
[im 6/9]
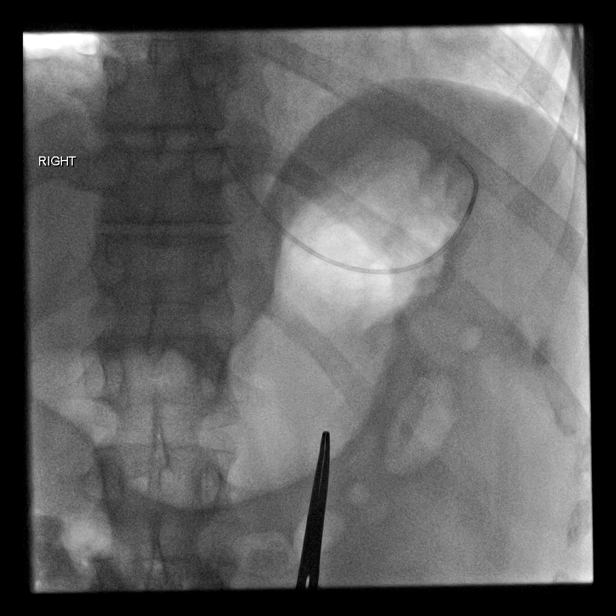
[im 7/9]
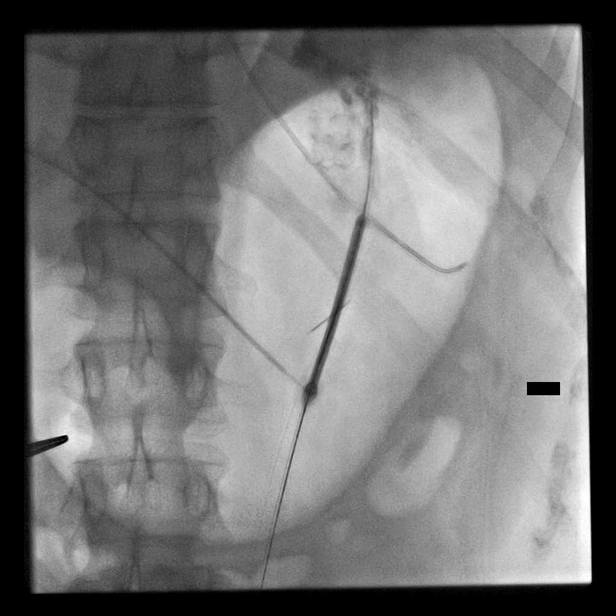
[im 8/9]
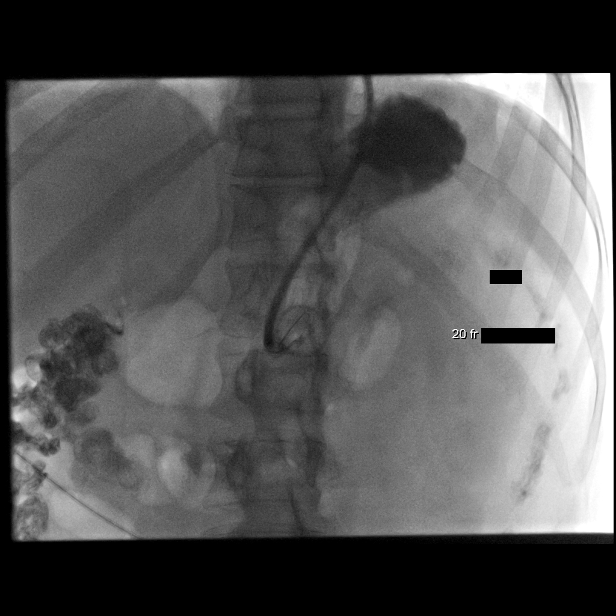
[im 9/9]
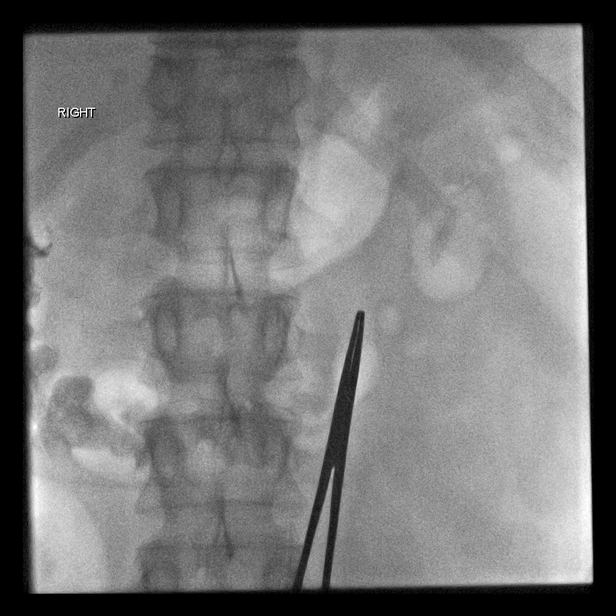

[10 of 10 positions shown; findings below may reference images not displayed]

MEDICATIONS:
Ancef 2 gm IV; Antibiotics were administered within 1 hour of the
procedure.

Glucagon 1 mg IV

CONTRAST:  10 cc [id number] = administered into the gastric lumen.

ANESTHESIA/SEDATION:
Moderate (conscious) sedation was employed during this procedure. A
total of Versed 1.5 mg and Fentanyl 50 mcg was administered
intravenously.

Moderate Sedation Time: 12 minutes. The patient's level of
consciousness and vital signs were monitored continuously by
radiology nursing throughout the procedure under my direct
supervision.

FLUOROSCOPY TIME:  1 minute, 48 seconds (90 mGy)

COMPLICATIONS:
None immediate.

PROCEDURE:
Informed written consent was obtained from the patient following
explanation of the procedure, risks, benefits and alternatives. A
time out was performed prior to the initiation of the procedure.
Ultrasound scanning was performed to demarcate the edge of the left
lobe of the liver. Maximal barrier sterile technique utilized
including caps, mask, sterile gowns, sterile gloves, large sterile
drape, hand hygiene and Betadine prep.

The left upper quadrant was sterilely prepped and draped. An oral
gastric catheter was inserted into the stomach under fluoroscopy.
The existing nasogastric feeding tube was removed. The left costal
margin and barium / air opacified transverse colon were identified
and avoided. Air was injected into the stomach for insufflation and
visualization under fluoroscopy. Under sterile conditions a 17 gauge
trocar needle was utilized to access the stomach percutaneously
beneath the left subcostal margin after the overlying soft tissues
were anesthetized with 1% Lidocaine with epinephrine. Needle
position was confirmed within the stomach with aspiration of air and
injection of small amount of contrast. A single T tack was deployed
for gastropexy. Over an Amplatz guide wire, a 9-French sheath was
inserted into the stomach. A snare device was utilized to capture
the oral gastric catheter. The snare device was pulled retrograde
from the stomach up the esophagus and out the oropharynx. The
20-French pull-through gastrostomy was connected to the snare device
and pulled antegrade through the oropharynx down the esophagus into
the stomach and then through the percutaneous tract external to the
patient. The gastrostomy was assembled externally. Contrast
injection confirms position in the stomach. Several spot
radiographic images were obtained in various obliquities for
documentation. The patient tolerated procedure well without
immediate post procedural complication.
FINDINGS: After successful fluoroscopic guided placement, the gastrostomy tube
is appropriately positioned with internal disc against the ventral
aspect of the gastric lumen.
IMPRESSION: Successful fluoroscopic insertion of a 20-French pull-through
gastrostomy tube.

The gastrostomy may be used immediately for medication
administration and in 24 hrs for the initiation of feeds.

## 2019-09-30 ENCOUNTER — Inpatient Hospital Stay: Payer: 59 | Admitting: Medical

## 2019-10-08 ENCOUNTER — Other Ambulatory Visit: Payer: Self-pay | Admitting: Medical

## 2019-10-08 DIAGNOSIS — C090 Malignant neoplasm of tonsillar fossa: Secondary | ICD-10-CM

## 2019-10-09 ENCOUNTER — Other Ambulatory Visit: Payer: Self-pay

## 2019-10-09 ENCOUNTER — Inpatient Hospital Stay: Payer: 59

## 2019-10-09 ENCOUNTER — Inpatient Hospital Stay: Payer: 59 | Attending: Medical | Admitting: Medical

## 2019-10-09 VITALS — BP 120/76 | HR 67 | Temp 97.9°F | Resp 17 | Ht 70.0 in | Wt 164.8 lb

## 2019-10-09 DIAGNOSIS — Z923 Personal history of irradiation: Secondary | ICD-10-CM | POA: Diagnosis not present

## 2019-10-09 DIAGNOSIS — C109 Malignant neoplasm of oropharynx, unspecified: Secondary | ICD-10-CM | POA: Diagnosis not present

## 2019-10-09 DIAGNOSIS — R5381 Other malaise: Secondary | ICD-10-CM | POA: Diagnosis not present

## 2019-10-09 DIAGNOSIS — Z9221 Personal history of antineoplastic chemotherapy: Secondary | ICD-10-CM | POA: Diagnosis not present

## 2019-10-09 DIAGNOSIS — C090 Malignant neoplasm of tonsillar fossa: Secondary | ICD-10-CM | POA: Insufficient documentation

## 2019-10-09 DIAGNOSIS — R682 Dry mouth, unspecified: Secondary | ICD-10-CM | POA: Insufficient documentation

## 2019-10-09 DIAGNOSIS — Z79899 Other long term (current) drug therapy: Secondary | ICD-10-CM | POA: Diagnosis not present

## 2019-10-09 DIAGNOSIS — R5383 Other fatigue: Secondary | ICD-10-CM | POA: Insufficient documentation

## 2019-10-09 LAB — CBC WITH DIFFERENTIAL (CANCER CENTER ONLY)
Abs Immature Granulocytes: 0.01 10*3/uL (ref 0.00–0.07)
Basophils Absolute: 0 10*3/uL (ref 0.0–0.1)
Basophils Relative: 0 %
Eosinophils Absolute: 0.1 10*3/uL (ref 0.0–0.5)
Eosinophils Relative: 2 %
HCT: 42.2 % (ref 39.0–52.0)
Hemoglobin: 13.9 g/dL (ref 13.0–17.0)
Immature Granulocytes: 0 %
Lymphocytes Relative: 14 %
Lymphs Abs: 0.5 10*3/uL — ABNORMAL LOW (ref 0.7–4.0)
MCH: 28.1 pg (ref 26.0–34.0)
MCHC: 32.9 g/dL (ref 30.0–36.0)
MCV: 85.3 fL (ref 80.0–100.0)
Monocytes Absolute: 0.4 10*3/uL (ref 0.1–1.0)
Monocytes Relative: 11 %
Neutro Abs: 2.4 10*3/uL (ref 1.7–7.7)
Neutrophils Relative %: 73 %
Platelet Count: 156 10*3/uL (ref 150–400)
RBC: 4.95 MIL/uL (ref 4.22–5.81)
RDW: 12.3 % (ref 11.5–15.5)
WBC Count: 3.3 10*3/uL — ABNORMAL LOW (ref 4.0–10.5)
nRBC: 0 % (ref 0.0–0.2)

## 2019-10-09 LAB — CMP (CANCER CENTER ONLY)
ALT: 16 U/L (ref 0–44)
AST: 16 U/L (ref 15–41)
Albumin: 4.4 g/dL (ref 3.5–5.0)
Alkaline Phosphatase: 49 U/L (ref 38–126)
Anion gap: 10 (ref 5–15)
BUN: 13 mg/dL (ref 6–20)
CO2: 27 mmol/L (ref 22–32)
Calcium: 9.2 mg/dL (ref 8.9–10.3)
Chloride: 104 mmol/L (ref 98–111)
Creatinine: 1.15 mg/dL (ref 0.61–1.24)
GFR, Est AFR Am: >60 mL/min (ref >60)
GFR, Estimated: >60 mL/min (ref >60)
Glucose, Bld: 93 mg/dL (ref 70–99)
Potassium: 4 mmol/L (ref 3.5–5.1)
Sodium: 141 mmol/L (ref 135–145)
Total Bilirubin: 0.9 mg/dL (ref 0.3–1.2)
Total Protein: 7.2 g/dL (ref 6.5–8.1)

## 2019-10-10 LAB — TSH: TSH: 2.025 u[IU]/mL (ref 0.320–4.118)

## 2019-10-13 ENCOUNTER — Telehealth: Payer: Self-pay | Admitting: Medical

## 2019-10-13 NOTE — Telephone Encounter (Signed)
Scheduled per sch msg. Called and spoke with patient. Patient requested time. Confirmed appt

## 2019-10-13 NOTE — Telephone Encounter (Signed)
Scheduled per 07/22 los, patient has been called and notified. 

## 2019-10-13 NOTE — Progress Notes (Signed)
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 Redmond Baseman with 3 months of R neck mass   11/07/2017 Imaging   CT neck w/ contrast:  1. Right tonsillar mass measures up to 2.6 cm, highly concerning for a squamous cell carcinoma. 2. Enlarged homogeneous right level 2 lymph node measures up to 3.9 cm, consistent with metastatic disease. 3. Adjacent right level 3 lymph node measures 1.1 cm. 4. Lucency at C4 on the left is likely degenerative. Metastatic disease is not excluded. Please correlate with findings on PET scan if used for further evaluation.   11/12/2017 Procedure   R tonsillar biopsy by Dr. Janace Hoard   11/12/2017 Pathology Results   Accession: [id number] Invasive squamous cell carcinoma; p16 diffusely positive   11/28/2017 Imaging   PET scan: 1. Right tonsillar fossa mass is intensely hypermetabolic compatible with primary neoplasm. 2. Solitary enlarged and hypermetabolic right level 2 cervical lymph node compatible with metastatic adenopathy. 3. No evidence for FDG avid hypermetabolic distant metastasis.   11/30/2017 Cancer Staging   Staging form: Pharynx - HPV-Mediated Oropharynx, AJCC 8th Edition - Clinical stage from 11/30/2017: Stage I (cT2, cN1, cM0, p16+) - Signed by Eppie Gibson, MD on 11/30/2017   12/07/2017 Miscellaneous   ENT evaluation to [facility] recommended against surgical resection due to the tumor extending into the right inferior nasopharynx and posterior soft palate, which would require  resection of almost 1/2 of the soft palate and the right nasopharyngeal wall extending into right posterior/lateral posterior wall.    Procedure   12/26/2017: port and PEG tube placement    12/28/2017 - 02/14/2018 Chemotherapy   The patient had palonosetron (ALOXI) injection 0.25 mg, 0.25 mg, Intravenous,  Once, 2 of 3 cycles Administration: 0.25 mg (12/28/2017), 0.25 mg (01/25/2018) CISplatin (PLATINOL) 196 mg in sodium chloride 0.9 % 500 mL chemo infusion, 100 mg/m2 = 196 mg, Intravenous,  Once, 2 of 3 cycles Dose modification: 80 mg/m2 (original dose 100 mg/m2, Cycle 2, Reason: Dose not tolerated, Comment: Neutropenia) Administration: 196 mg (12/28/2017), 157 mg (01/25/2018) fosaprepitant (EMEND) 150 mg, dexamethasone (DECADRON) 12 mg in sodium chloride 0.9 % 145 mL IVPB, , Intravenous,  Once, 2 of 3 cycles Administration:  (12/28/2017),  (01/25/2018)  for chemotherapy treatment.          INTERVAL HISTORY:    Thomas Mccall is a 59 y.o. male with a diagnosis of a Stage I (cT2, cN1, cM0, p16+) malignant neoplasm of tonsillar fossa. He presents to clinic today for routine follow up having last been seen on 05/27/2019 by Dr. Tish Men and on 05/21/2019 by Dr. Eppie Gibson . He presents to the clinic today with his wife.  She reports that he has been sleeping more lately and appears to be more tired.  He also has episodes where he is feeling cold.  His weight is stable.  He continues to have a dry mouth but no difficulty swallowing.  His wife would like to have an HPV DNA test ordered as she has been reviewing a study at [facility] which predicts the risk of disease recurrence based on HPV status.     REVIEW OF SYSTEMS:   Visual changes:    no   Swelling of the face:    no   Swelling of the mouth:    no   Swelling of the throat:    no   Dental problems:    no   Skin changes at treatment site:  no   Dry mouth:     yes   Difficulty talking:    no  Thickened saliva:    no     Increased mucus:    no  Difficulty opening mouth:   no  Fatigue:     yes   Pain or difficulty swallowing or chewing: no    Loss of appetite:    no   Numbness of the ear:    no  Weakness raising the arm(s):  no  Lack of lip movement:    no  Facial disfigurement:    no  Numbness of face or neck:   no  Loss of voice or impaired speech:  no  Hearing difficulty:    no  Lymphedema:     no  Pain:      no       -Weight: has not lost weight      -Last ENT visit:    11/12/2017   -Last Rad Onc visit:   05/21/2019   -Last Dentist visit:   03/29/2018     CURRENT MEDICATIONS:    Current Outpatient Medications on File Prior to Visit  Medication Sig Dispense Refill  . [DISCONTINUED] prochlorperazine (COMPAZINE) 10 MG tablet Take 1 tablet (10 mg total) by mouth every 6 (six) hours as needed (Nausea or vomiting). (Patient not taking: Reported on 05/21/2018) 30 tablet 1   No current facility-administered medications on file prior to visit.        ALLERGIES:    No Known Allergies      ADDITIONAL REVIEW OF SYSTEMS:    Review of Systems  Constitutional: Positive for malaise/fatigue. Negative for chills, diaphoresis, fever and weight loss.  HENT: Negative for congestion, ear discharge, ear pain, hearing loss, sore throat and tinnitus.   Respiratory: Negative for cough, shortness of breath and wheezing.   Cardiovascular: Negative for chest pain, palpitations and leg swelling.  Gastrointestinal: Negative for abdominal pain, constipation, diarrhea, nausea and vomiting.  Genitourinary: Negative for urgency.  Musculoskeletal: Negative for back pain and neck pain.  Skin: Negative for rash.  Neurological: Positive for weakness. Negative for speech change and headaches.        PHYSICAL EXAM:    Vitals:   10/09/19 1531  BP: 120/76  Pulse: 67  Resp: 17  Temp: 97.9 F (36.6 C)  SpO2: 99%    Filed Weights   10/09/19 1531  Weight: 164 lb 12.8 oz (74.8 kg)     Weight Date  164.8 10/09/2019  160 pounds 2.8 ounces  05/27/2019  155 pounds 0.6 ounces  01/28/2019            Pre-treatment (RT consult date):     General: Thomas Mccall is a 59 y.o. male who appears to be in no acute distress.? Thomas Mccall is accompanied/unaccompanied today.    Physical Exam Constitutional:      General: He is not in acute distress.  Appearance: Normal appearance. He is not ill-appearing, toxic-appearing or diaphoretic.  HENT:     Head: Normocephalic and atraumatic.     Right Ear: Tympanic membrane, ear canal and external ear normal.     Left Ear: Tympanic membrane, ear canal and external ear normal.     Mouth/Throat:     Mouth: Mucous membranes are moist.     Pharynx: Oropharynx is clear. No oropharyngeal exudate or posterior oropharyngeal erythema.  Eyes:     General: No scleral icterus.       Right eye: No discharge.        Left eye: No discharge.     Conjunctiva/sclera: Conjunctivae normal.  Neck:     Comments: Skin appeared mildly thickened in the right neck. Cardiovascular:     Rate and Rhythm: Normal rate and regular rhythm.     Heart sounds: No murmur heard.  No friction rub. No gallop.   Pulmonary:     Effort: Pulmonary effort is normal. No respiratory distress.     Breath sounds: Normal breath sounds. No wheezing, rhonchi or rales.  Abdominal:     General: Abdomen is flat. There is no distension.     Palpations: Abdomen is soft.     Tenderness: There is no abdominal tenderness. There is no guarding or rebound.     Comments: There is a well-healed surgical incision in the left upper abdomen consistent with a previously placed PEG tube  Musculoskeletal:     Cervical back: Normal range of motion and neck supple. No rigidity or tenderness.     Right lower leg: No edema.     Left lower leg: No edema.  Lymphadenopathy:     Cervical: No cervical adenopathy.  Skin:    General: Skin is warm and dry.     Coloration: Skin is  not jaundiced or pale.     Findings: No bruising, erythema, lesion or rash.  Neurological:     General: No focal deficit present.     Mental Status: He is alert.     Coordination: Coordination normal.     Gait: Gait normal.  Psychiatric:        Mood and Affect: Mood normal.        Behavior: Behavior normal.        Thought Content: Thought content normal.        Judgment: Judgment normal.       LABORATORY DATA:    The patient's complete chemistry panel returned showing:   Lab Results  Component Value Date   NA 141 10/09/2019   K 4.0 10/09/2019   CL 104 10/09/2019   CO2 27 10/09/2019   GLUCOSE 93 10/09/2019   BUN 13 10/09/2019   CREATININE 1.15 10/09/2019   ALBUMIN 4.4 10/09/2019   ALKPHOS 49 10/09/2019   ALT 16 10/09/2019   AST 16 10/09/2019   BILITOT 0.9 10/09/2019        The patient's TSH returned at:  Lab Results  Component Value Date   TSH 2.025 10/09/2019   TSH 0.550 12/12/2017        The patient's complete blood count returned showing:      Component Value Date/Time   WBC 3.3 (L) 10/09/2019 1509   WBC 3.2 (L) 05/24/2018 1100   RBC 4.95 10/09/2019 1509   HGB 13.9 10/09/2019 1509   HCT 42.2 10/09/2019 1509   PLT 156 10/09/2019 1509   MCV 85.3 10/09/2019 1509   MCH 28.1 10/09/2019 1509   MCHC 32.9  10/09/2019 1509   RDW 12.3 10/09/2019 1509   LYMPHSABS 0.5 (L) 10/09/2019 1509   MONOABS 0.4 10/09/2019 1509   EOSABS 0.1 10/09/2019 1509   BASOSABS 0.0 10/09/2019 1509        DIAGNOSTIC IMAGING:    No results found.       ASSESSMENT & PLAN:    Thomas Mccall is a 59 y.o. male with a diagnosis of a Stage I (cT2, cN1, cM0, p16+) malignant neoplasm of tonsillar fossa.which was originally diagnosed on 11/12/2017. He was treated with concurrent chemoradiation with cisplatin dose from 12/28/2017 through 02/13/2018.  He presents to the survivorship clinic today for routine follow-up having last been seen on 05/27/2019 by Dr. Tish Men and on  05/21/2019 by Dr. Eppie Gibson.     1. Oral and oropharyngeal cancer ( malignant neoplasm of tonsillar fossa ):  [name] is clinically without evidence of residual or recurrence cancer on physical exam today.         2. Nutritional status: [name] reports that he is currently able to consume adequate nutrition by mouth.  His weight is higher at 164.8 lbs today.  He was encouraged to continue to consume adequate hydration and nutrition, as tolerated.        3. At risk for dysphagia: Given [name] treatment for oral and oropharyngeal cancer ( malignant neoplasm of tonsillar fossa ), which included concurrent chemoradiation, he maybe at risk for chronic dysphagia.  He reports having no difficulty with oral intake of foods. He is able to consume foods and liquids without difficulty.     4.  At risk for neck lymphedema:  When patients with head & neck cancers are treated with surgery and/or radiation therapy, there is an associated increased risk of neck lymphedema.  [name] reports that currently he is not experiencing symptoms.     5.  At risk for hypothyroidism: The thyroid gland is often affected after treatment for head & neck cancer.  [name] most recent TSH is as noted below: Lab Results  Component Value Date   TSH 2.025 10/09/2019   TSH 0.550 12/12/2017  We discussed that he will continue to have serial TSH monitoring for at least the next 5 years as part of his routine follow-up and post-cancer treatment care.          6. At risk for tooth decay/dental concerns: After treatment with radiation for head & neck cancers, patients often experience dry mouth which increases their risk of dental caries. [name] was encouraged to see his dentist 3-4 times per year.       DISPOSITION:    -See Dr. Izora Mccall (ENT) on 10/23/2019  -Return to cancer center to see Dr. Dr. Lanell Mccall (to be scheduled)  -Return to cancer center to see Survivorship PA in 3 months    Thomas Mccall, Thomas Mccall, Thomas Mccall [facility]  10/13/19   11:45 AM        NOTE: PRIMARY CARE PROVIDER   Martin[place], Betty G, MD   Phone: [phone]   Fax: [phone]

## 2019-10-14 ENCOUNTER — Telehealth: Payer: Self-pay | Admitting: Emergency Medicine

## 2019-10-14 ENCOUNTER — Other Ambulatory Visit: Payer: 59

## 2019-10-14 ENCOUNTER — Other Ambulatory Visit: Payer: Self-pay

## 2019-10-14 ENCOUNTER — Other Ambulatory Visit: Payer: Self-pay | Admitting: Medical

## 2019-10-14 ENCOUNTER — Inpatient Hospital Stay: Payer: 59

## 2019-10-14 DIAGNOSIS — C090 Malignant neoplasm of tonsillar fossa: Secondary | ICD-10-CM

## 2019-10-14 NOTE — Telephone Encounter (Signed)
Verified lab orders per PA Lucianne Lei for HPV DNA Probe only (no CBC/CMP/TSH needed today per PA) with Sabrina in lab.

## 2019-10-23 DIAGNOSIS — C4492 Squamous cell carcinoma of skin, unspecified: Secondary | ICD-10-CM | POA: Diagnosis not present

## 2019-11-13 DIAGNOSIS — C090 Malignant neoplasm of tonsillar fossa: Secondary | ICD-10-CM | POA: Diagnosis not present

## 2019-11-25 ENCOUNTER — Telehealth: Payer: Self-pay | Admitting: Hematology

## 2019-11-25 NOTE — Telephone Encounter (Signed)
Release: [id number] Faxed medical records to [facility] @ fax#[phone]

## 2019-12-02 ENCOUNTER — Encounter: Payer: Self-pay | Admitting: Family Medicine

## 2019-12-02 ENCOUNTER — Other Ambulatory Visit: Payer: Self-pay

## 2019-12-02 ENCOUNTER — Ambulatory Visit (INDEPENDENT_AMBULATORY_CARE_PROVIDER_SITE_OTHER): Payer: 59 | Admitting: Family Medicine

## 2019-12-02 VITALS — BP 110/70 | HR 74 | Temp 97.7°F | Resp 12 | Ht 70.0 in | Wt 167.0 lb

## 2019-12-02 DIAGNOSIS — C770 Secondary and unspecified malignant neoplasm of lymph nodes of head, face and neck: Secondary | ICD-10-CM | POA: Diagnosis not present

## 2019-12-02 DIAGNOSIS — Z0000 Encounter for general adult medical examination without abnormal findings: Secondary | ICD-10-CM

## 2019-12-02 NOTE — Progress Notes (Signed)
HPI: [name] is a 59 y.o.male here today for his routine physical examination.  Last CPE: 07/2008. He was seen on 10/26/2017, when he establish care. Since his last visit he has been diagnosed with right tonsillar squamocellular carcinoma in 10/2017. No Hx of tobacco use. + Pharyngeal HPV.  12/2017 PEG tube placement. 12/28/2017-02/14/09 he received chemo and radiation. No serious complications from radiation. No changes in taste or neuropathy. Residual fatigue and malaise.  Follows with oncologist and ENT.  He lives with his wife and 63 yo son.  Regular exercise 3 or more times per week: Not consistently. Following a healthy diet: Eating high calorie food, trying to gain wt. Drinks alcohol seldom.  Immunization History  Administered Date(s) Administered  . Influenza,inj,Quad PF,6+ Mos 12/28/2017   -Hep C screening: Never  Last colon cancer screening: Never. Last prostate ca screening: Never. Nocturia x 0. States that he was scanned "from head to toe" and no signs of malignancy elsewhere.  He has no concerns today.  Review of Systems  Constitutional: Positive for fatigue. Negative for activity change, appetite change, diaphoresis and fever.  HENT: Negative for dental problem, nosebleeds, sore throat, trouble swallowing and voice change.   Eyes: Negative for redness and visual disturbance.  Respiratory: Negative for apnea, cough, shortness of breath and wheezing.   Cardiovascular: Negative for chest pain, palpitations and leg swelling.  Gastrointestinal: Negative for abdominal pain, blood in stool, nausea and vomiting.  Endocrine: Negative for polydipsia and polyuria.  Genitourinary: Negative for decreased urine volume, dysuria, genital sores, hematuria and testicular pain.  Musculoskeletal: Negative for arthralgias, back pain, joint swelling and myalgias.  Skin: Negative for color change and rash.  Neurological: Negative for syncope, weakness and  headaches.  Hematological: Negative for adenopathy. Does not bruise/bleed easily.  Psychiatric/Behavioral: Negative for confusion and sleep disturbance. The patient is not nervous/anxious.     Current Outpatient Medications on File Prior to Visit  Medication Sig Dispense Refill  . [DISCONTINUED] prochlorperazine (COMPAZINE) 10 MG tablet Take 1 tablet (10 mg total) by mouth every 6 (six) hours as needed (Nausea or vomiting). (Patient not taking: Reported on 05/21/2018) 30 tablet 1   No current facility-administered medications on file prior to visit.     Past Medical History:  Diagnosis Date  . Headache   . History of radiation therapy 12/27/17- 02/13/18   right tonsil and bilateral neck.   . Mass    right tonsil  . Wears glasses     Past Surgical History:  Procedure Laterality Date  . DIRECT LARYNGOSCOPY Right 11/12/2017   Procedure: DIRECT LARYNGOSCOPY;  Surgeon: Melissa Montane, MD;  Location: [facility];  Service: ENT;  Laterality: Right;  Direct laryngoscopy and biopy of right tonsil mass  . IR GASTROSTOMY TUBE MOD SED  12/26/2017  . IR GASTROSTOMY TUBE REMOVAL  08/28/2018  . IR IMAGING GUIDED PORT INSERTION  12/26/2017  . IR REMOVAL TUN ACCESS W/ PORT W/O FL MOD SED  05/24/2018  . TONSILLECTOMY AND ADENOIDECTOMY  1970  . WISDOM TOOTH EXTRACTION      No Known Allergies  Family History  Problem Relation Age of Onset  . Heart attack Maternal Grandmother   . Heart attack Maternal Grandfather   . Heart attack Paternal Grandmother   . Heart attack Paternal Grandfather     Social History   Socioeconomic History  . Marital status: Married    Spouse name: Not on file  . Number of children: Not on file  .  Years of education: Not on file  . Highest education level: Not on file  Occupational History  . Not on file  Tobacco Use  . Smoking status: Never Smoker  . Smokeless tobacco: Never Used  Vaping Use  . Vaping Use: Never used  Substance and Sexual Activity  . Alcohol use: Not  Currently  . Drug use: Never  . Sexual activity: Yes    Partners: Female  Other Topics Concern  . Not on file  Social History Narrative  . Not on file   Social Determinants of Health   Financial Resource Strain:   . Difficulty of Paying Living Expenses: Not on file  Food Insecurity:   . Worried About Charity fundraiser in the Last Year: Not on file  . Ran Out of Food in the Last Year: Not on file  Transportation Needs:   . Lack of Transportation (Medical): Not on file  . Lack of Transportation (Non-Medical): Not on file  Physical Activity:   . Days of Exercise per Week: Not on file  . Minutes of Exercise per Session: Not on file  Stress:   . Feeling of Stress : Not on file  Social Connections:   . Frequency of Communication with Friends and Family: Not on file  . Frequency of Social Gatherings with Friends and Family: Not on file  . Attends Religious Services: Not on file  . Active Member of Clubs or Organizations: Not on file  . Attends Archivist Meetings: Not on file  . Marital Status: Not on file     Vitals:   12/02/19 1405  BP: 110/70  Pulse: 74  Resp: 12  Temp: 97.7 F (36.5 C)  SpO2: 98%   Body mass index is 23.96 kg/m.  Wt Readings from Last 3 Encounters:  12/02/19 167 lb (75.8 kg)  10/09/19 164 lb 12.8 oz (74.8 kg)  05/27/19 160 lb 12.8 oz (72.9 kg)   Physical Exam Vitals and nursing note reviewed.  Constitutional:      General: He is not in acute distress.    Appearance: He is well-developed, well-groomed and normal weight.  HENT:     Head: Normocephalic and atraumatic.     Right Ear: Tympanic membrane, ear canal and external ear normal.     Left Ear: Tympanic membrane, ear canal and external ear normal.     Mouth/Throat:     Mouth: Mucous membranes are moist. No oral lesions.     Tongue: No lesions.     Pharynx: Oropharynx is clear. No oropharyngeal exudate.     Comments: Tonsils absent. Eyes:     Extraocular Movements:  Extraocular movements intact.     Conjunctiva/sclera: Conjunctivae normal.     Pupils: Pupils are equal, round, and reactive to light.  Neck:     Thyroid: No thyromegaly.     Trachea: No tracheal deviation.  Cardiovascular:     Rate and Rhythm: Normal rate and regular rhythm.     Pulses:          Dorsalis pedis pulses are 2+ on the right side and 2+ on the left side.     Heart sounds: No murmur heard.   Pulmonary:     Effort: Pulmonary effort is normal. No respiratory distress.     Breath sounds: Normal breath sounds.  Abdominal:     Palpations: Abdomen is soft. There is no hepatomegaly or mass.     Tenderness: There is no abdominal tenderness.  Genitourinary:  Comments: No concerns. Musculoskeletal:        General: No tenderness.     Cervical back: Normal range of motion.     Comments: No signs of synovitis.  Lymphadenopathy:     Cervical: No cervical adenopathy.  Skin:    General: Skin is warm.     Findings: No erythema or rash.     Comments: Skin texture changes right side of neck, s/p radiation.  Neurological:     Mental Status: He is alert and oriented to person, place, and time.     Cranial Nerves: No cranial nerve deficit.     Sensory: No sensory deficit.     Coordination: Coordination normal.     Gait: Gait normal.     Deep Tendon Reflexes:     Reflex Scores:      Bicep reflexes are 2+ on the right side and 2+ on the left side.      Patellar reflexes are 2+ on the right side and 2+ on the left side. Psychiatric:        Mood and Affect: Mood and affect normal.   ASSESSMENT AND PLAN:  Mr. Rockey was seen today for annual exam.  Diagnoses and all orders for this visit:  Routine general medical examination at a health care facility Regular physical activity and a healthful diet for prevention of chronic illness. Preventive guidelines reviewed.For now he prefers to hold on screening tests. Vaccination: Flu vaccine he can get through employer. He does not want  Tdap or pneumovax at this time.  Next CPE in a year.  Secondary and unspecified malignant neoplasm of lymph nodes of head, face and neck (HCC) S/P chemo and radiation therapy. Following with oncologist, every 3 months.   Return in 1 year (on 12/01/2020) for cpe.   Anamari Galeas G. [place], MD  [facility]. [place] office. A few things to remember from today's visit:  Routine general medical examination at a health care facility  Encounter for HCV screening test for low risk patient  You can discuss vaccinations with your oncologist. You need flu vaccine,shingles,Tdap,and I recommend pneumovax (pneumonia).  If you need refills please call your pharmacy. Do not use My Chart to request refills or for acute issues that need immediate attention.   Please be sure medication list is accurate. If a new problem present, please set up appointment sooner than planned today.   At least 150 minutes of moderate exercise per week, daily brisk walking for 15-30 min is a good exercise option. Healthy diet low in saturated (animal) fats and sweets and consisting of fresh fruits and vegetables, lean meats such as fish and white chicken and whole grains.  - Vaccines:  Tdap vaccine every 10 years.  Shingles vaccine recommended at age 29, could be given after 61 years of age but not sure about insurance coverage.  Pneumonia vaccines: Pneumovax at 16 -Screening recommendations for low/normal risk males:  Screening for diabetes at age 85 and every 3 years. Earlier screening if cardiovascular risk factors.  Lipid screening at 35 and every 3 years. Screening starts in younger males with cardiovascular risk factors.  Colon cancer screening is now at age 73 but your insurance may not cover until age 63 .screening is recommended age 83.  Prostate cancer screening: some controversy, starts usually at 42: Rectal exam and PSA.  Aortic Abdominal Aneurism once between 39 and 8 years old if ever  smoker.  Also recommended:  1. Dental visit- Brush and floss your teeth  twice daily; visit your dentist twice a year. 2. Eye doctor- Get an eye exam at least every 2 years. 3. Helmet use- Always wear a helmet when riding a bicycle, motorcycle, rollerblading or skateboarding. 4. Safe sex- If you may be exposed to sexually transmitted infections, use a condom. 5. Seat belts- Seat belts can save your live; always wear one. 6. Smoke/Carbon Monoxide detectors- These detectors need to be installed on the appropriate level of your home. Replace batteries at least once a year. 7. Skin cancer- When out in the sun please cover up and use sunscreen 15 SPF or higher. 8. Violence- If anyone is threatening or hurting you, please tell your healthcare provider.  9. Drink alcohol in moderation- Limit alcohol intake to one drink or less per day. Never drink and drive.

## 2019-12-02 NOTE — Patient Instructions (Addendum)
A few things to remember from today's visit:  Routine general medical examination at a health care facility  Encounter for HCV screening test for low risk patient  You can discuss vaccinations with your oncologist. You need flu vaccine,shingles,Tdap,and I recommend pneumovax (pneumonia).  If you need refills please call your pharmacy. Do not use My Chart to request refills or for acute issues that need immediate attention.   Please be sure medication list is accurate. If a new problem present, please set up appointment sooner than planned today.   At least 150 minutes of moderate exercise per week, daily brisk walking for 15-30 min is a good exercise option. Healthy diet low in saturated (animal) fats and sweets and consisting of fresh fruits and vegetables, lean meats such as fish and white chicken and whole grains.  - Vaccines:  Tdap vaccine every 10 years.  Shingles vaccine recommended at age 2, could be given after 61 years of age but not sure about insurance coverage.  Pneumonia vaccines: Pneumovax at 75 -Screening recommendations for low/normal risk males:  Screening for diabetes at age 24 and every 3 years. Earlier screening if cardiovascular risk factors.   Lipid screening at 35 and every 3 years. Screening starts in younger males with cardiovascular risk factors.  Colon cancer screening is now at age 24 but your insurance may not cover until age 34 .screening is recommended age 57.  Prostate cancer screening: some controversy, starts usually at 4: Rectal exam and PSA.  Aortic Abdominal Aneurism once between 35 and 44 years old if ever smoker.  Also recommended:  1. Dental visit- Brush and floss your teeth twice daily; visit your dentist twice a year. 2. Eye doctor- Get an eye exam at least every 2 years. 3. Helmet use- Always wear a helmet when riding a bicycle, motorcycle, rollerblading or skateboarding. 4. Safe sex- If you may be exposed to sexually  transmitted infections, use a condom. 5. Seat belts- Seat belts can save your live; always wear one. 6. Smoke/Carbon Monoxide detectors- These detectors need to be installed on the appropriate level of your home. Replace batteries at least once a year. 7. Skin cancer- When out in the sun please cover up and use sunscreen 15 SPF or higher. 8. Violence- If anyone is threatening or hurting you, please tell your healthcare provider.  9. Drink alcohol in moderation- Limit alcohol intake to one drink or less per day. Never drink and drive.

## 2019-12-04 DIAGNOSIS — C770 Secondary and unspecified malignant neoplasm of lymph nodes of head, face and neck: Secondary | ICD-10-CM | POA: Insufficient documentation

## 2020-01-08 ENCOUNTER — Other Ambulatory Visit: Payer: Self-pay

## 2020-01-08 ENCOUNTER — Inpatient Hospital Stay: Payer: 59 | Attending: Medical | Admitting: Medical

## 2020-01-08 ENCOUNTER — Inpatient Hospital Stay (HOSPITAL_BASED_OUTPATIENT_CLINIC_OR_DEPARTMENT_OTHER): Payer: 59 | Admitting: Medical

## 2020-01-08 ENCOUNTER — Other Ambulatory Visit: Payer: Self-pay | Admitting: Medical

## 2020-01-08 VITALS — BP 122/77 | HR 68 | Temp 97.8°F | Resp 20 | Ht 70.0 in | Wt 166.9 lb

## 2020-01-08 DIAGNOSIS — C770 Secondary and unspecified malignant neoplasm of lymph nodes of head, face and neck: Secondary | ICD-10-CM

## 2020-01-08 DIAGNOSIS — C090 Malignant neoplasm of tonsillar fossa: Secondary | ICD-10-CM

## 2020-01-08 LAB — CBC WITH DIFFERENTIAL (CANCER CENTER ONLY)
Abs Immature Granulocytes: 0.01 10*3/uL (ref 0.00–0.07)
Basophils Absolute: 0 10*3/uL (ref 0.0–0.1)
Basophils Relative: 1 %
Eosinophils Absolute: 0 10*3/uL (ref 0.0–0.5)
Eosinophils Relative: 1 %
HCT: 43.8 % (ref 39.0–52.0)
Hemoglobin: 14.8 g/dL (ref 13.0–17.0)
Immature Granulocytes: 0 %
Lymphocytes Relative: 12 %
Lymphs Abs: 0.4 10*3/uL — ABNORMAL LOW (ref 0.7–4.0)
MCH: 29 pg (ref 26.0–34.0)
MCHC: 33.8 g/dL (ref 30.0–36.0)
MCV: 85.9 fL (ref 80.0–100.0)
Monocytes Absolute: 0.4 10*3/uL (ref 0.1–1.0)
Monocytes Relative: 10 %
Neutro Abs: 2.9 10*3/uL (ref 1.7–7.7)
Neutrophils Relative %: 76 %
Platelet Count: 142 10*3/uL — ABNORMAL LOW (ref 150–400)
RBC: 5.1 MIL/uL (ref 4.22–5.81)
RDW: 12.3 % (ref 11.5–15.5)
WBC Count: 3.8 10*3/uL — ABNORMAL LOW (ref 4.0–10.5)
nRBC: 0 % (ref 0.0–0.2)

## 2020-01-08 LAB — CMP (CANCER CENTER ONLY)
ALT: 11 U/L (ref 0–44)
AST: 13 U/L — ABNORMAL LOW (ref 15–41)
Albumin: 4.2 g/dL (ref 3.5–5.0)
Alkaline Phosphatase: 59 U/L (ref 38–126)
Anion gap: 8 (ref 5–15)
BUN: 14 mg/dL (ref 6–20)
CO2: 27 mmol/L (ref 22–32)
Calcium: 9.7 mg/dL (ref 8.9–10.3)
Chloride: 106 mmol/L (ref 98–111)
Creatinine: 1.26 mg/dL — ABNORMAL HIGH (ref 0.61–1.24)
GFR, Estimated: >60 mL/min (ref >60)
Glucose, Bld: 84 mg/dL (ref 70–99)
Potassium: 4.1 mmol/L (ref 3.5–5.1)
Sodium: 141 mmol/L (ref 135–145)
Total Bilirubin: 0.5 mg/dL (ref 0.3–1.2)
Total Protein: 7.2 g/dL (ref 6.5–8.1)

## 2020-01-08 LAB — TSH: TSH: 2.144 u[IU]/mL (ref 0.320–4.118)

## 2020-01-08 NOTE — Patient Instructions (Signed)

## 2020-01-21 NOTE — Progress Notes (Signed)
These results were reviewed with the patient.

## 2020-01-27 IMAGING — PT NM PET TUM IMG RESTAG (PS) SKULL BASE T - THIGH
1 of 5 series · 1 of 25 positions shown · non-contrast
Comparison: PET-CT 11/28/2017

CLINICAL DATA: Initial treatment strategy for head neck cancer.
Tonsil carcinoma..

EXAM:
NUCLEAR MEDICINE PET SKULL BASE TO THIGH
TECHNIQUE: 7.0 mCi F-18 FDG was injected intravenously. Full-ring PET imaging
was performed from the skull base to thigh after the radiotracer. CT
data was obtained and used for attenuation correction and anatomic
localization.
Fasting blood glucose: 99 mg/dl

[Series 4: ct hn_sk_th 5.0 b31f · axial · 5.0mm · 0.98mm/px · 1 of 234 slices shown]
[im 234/234  brain]
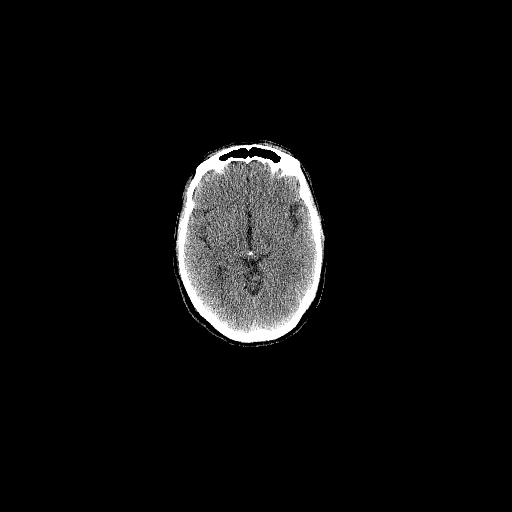

[1 of 25 positions shown; findings below may reference images not displayed]

FINDINGS: Mediastinal blood pool activity: SUV max

NECK: Resolution hypermetabolic activity in the RIGHT lingual tonsil
region. Resolution of metabolic activity and size of previously seen
RIGHT level 2 lymph node. No residual hypermetabolic LEFT or RIGHT
cervical lymph nodes.

Incidental CT findings: none

CHEST: No hypermetabolic mediastinal or hilar nodes. No suspicious
pulmonary nodules on the CT scan.

Incidental CT findings: Port in the anterior chest wall with tip in
distal SVC.

ABDOMEN/PELVIS: No abnormal hypermetabolic activity within the
liver, pancreas, adrenal glands, or spleen. No hypermetabolic lymph
nodes in the abdomen or pelvis.

Incidental CT findings: Peg tube in place.  Prostate enlarged.

SKELETON: No focal hypermetabolic activity to suggest skeletal
metastasis.

Incidental CT findings: none
IMPRESSION: 1. Resolution of metabolic activity in the RIGHT hypopharynx /
tonsil.
2. Resolution of hypermetabolic cervical adenopathy on the RIGHT.
3. No evidence metastatic cervical adenopathy in the neck.
4. No evidence distant metastatic disease.
5. Peg tube in good position.

## 2020-02-17 NOTE — Progress Notes (Signed)
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  Mr. Sartwell was last seen in the survivorship clinic on 10/09/2019.     BRIEF ONCOLOGIC HISTORY:    Oncology History  Malignant neoplasm of tonsillar fossa ([place]) (Resolved)  10/30/2017 Miscellaneous   Presented to Dr. Redmond Baseman with 3 months of R neck mass   11/07/2017 Imaging   CT neck w/ contrast:  1. Right tonsillar mass measures up to 2.6 cm, highly concerning for a squamous cell carcinoma. 2. Enlarged homogeneous right level 2 lymph node measures up to 3.9 cm, consistent with metastatic disease. 3. Adjacent right level 3 lymph node measures 1.1 cm. 4. Lucency at C4 on the left is likely degenerative. Metastatic disease is not excluded. Please correlate with findings on PET scan if used for further evaluation.   11/12/2017 Procedure   R tonsillar biopsy by Dr. Janace Hoard   11/12/2017 Pathology Results   Accession: [id number] Invasive squamous cell carcinoma; p16 diffusely positive   11/28/2017 Imaging   PET scan: 1. Right tonsillar fossa mass is intensely hypermetabolic compatible with primary neoplasm. 2. Solitary enlarged and hypermetabolic right level 2 cervical lymph node compatible with metastatic adenopathy. 3. No evidence for FDG avid hypermetabolic distant metastasis.   11/30/2017 Cancer Staging   Staging form: Pharynx - HPV-Mediated Oropharynx, AJCC 8th Edition - Clinical stage from 11/30/2017: Stage I (cT2, cN1, cM0, p16+) - Signed by Eppie Gibson, MD on 11/30/2017   12/07/2017 Miscellaneous   ENT evaluation to [facility] recommended against surgical resection due to the tumor extending into the  right inferior nasopharynx and posterior soft palate, which would require resection of almost 1/2 of the soft palate and the right nasopharyngeal wall extending into right posterior/lateral posterior wall.    Procedure   12/26/2017: port and PEG tube placement    12/28/2017 - 01/25/2018 Chemotherapy   The patient had dexamethasone (DECADRON) 4 MG tablet, 1 of 1 cycle, Start date: 12/12/2017, End date: 03/06/2018 palonosetron (ALOXI) injection 0.25 mg, 0.25 mg, Intravenous,  Once, 2 of 3 cycles Administration: 0.25 mg (12/28/2017), 0.25 mg (01/25/2018) CISplatin (PLATINOL) 196 mg in sodium chloride 0.9 % 500 mL chemo infusion, 100 mg/m2 = 196 mg, Intravenous,  Once, 2 of 3 cycles Dose modification: 80 mg/m2 (original dose 100 mg/m2, Cycle 2, Reason: Dose not tolerated, Comment: Neutropenia) Administration: 196 mg (12/28/2017), 157 mg (01/25/2018) fosaprepitant (EMEND) 150 mg, dexamethasone (DECADRON) 12 mg in sodium chloride 0.9 % 145 mL IVPB, , Intravenous,  Once, 2 of 3 cycles Administration:  (12/28/2017),  (01/25/2018)  for chemotherapy treatment.          INTERVAL HISTORY:    Yaiden Yang is a 59 y.o. male with a diagnosis of a Stage I (cT2, cN1, cM0, p16+) malignant neoplasm of tonsillar fossa. He presents to clinic today for routine follow up having last been seen in the survivorship clinic on 10/09/2019.  Since his last visit, he had an HPV DNA test collected which returned negative showing a lower risk of disease recurrence based on HPV status.  He reports that he is doing well.  He denies any issues of concern today.  He continues to have dryness in his mouth which is stable.  He continues to have water available at all times.  He has been released by Dr. Isidore Moos and does not need to return to radiation oncology.  He will see Dr. Constance Holster in February and saw his primary care provider last 2 weeks ago.     REVIEW OF SYSTEMS:   Visual changes:    no   Swelling of the face:    no    Swelling of the mouth:    no   Swelling of the throat:    no   Dental problems:    no   Skin changes at treatment site:  no   Dry mouth:     yes   Difficulty talking:    no  Thickened saliva:    no   Increased mucus:    no  Difficulty opening mouth:   no  Fatigue:     no  Pain or difficulty swallowing or chewing: no    Loss of appetite:    no   Numbness of the ear:    no  Weakness raising the arm(s):  no  Lack of lip movement:    no  Facial disfigurement:    no  Numbness of face or neck:   no  Loss of voice or impaired speech:  no  Hearing difficulty:    no  Lymphedema:     no  Pain:      no       -Weight: has not lost weight      -Last ENT visit:    11/12/2017   -Last Rad Onc visit:   05/21/2019   -Last Dentist visit:   03/29/2018     CURRENT MEDICATIONS:    Current Outpatient Medications on File Prior to Visit  Medication Sig Dispense Refill  . [DISCONTINUED] prochlorperazine (COMPAZINE) 10 MG tablet Take 1 tablet (10 mg total) by mouth every 6 (six) hours as needed (Nausea or vomiting). (Patient not taking: Reported on 05/21/2018) 30 tablet 1   No current facility-administered medications on file prior to visit.        ALLERGIES:    No Known Allergies      ADDITIONAL REVIEW OF SYSTEMS:    Review of Systems  Constitutional: Negative for chills, diaphoresis, fever, malaise/fatigue and weight loss.  HENT: Negative for congestion, ear discharge, ear pain, hearing loss, sore throat and tinnitus.        Stable oral dryness  Respiratory: Negative for cough, shortness of breath and wheezing.   Cardiovascular: Negative for chest pain, palpitations and leg swelling.  Gastrointestinal: Negative for abdominal pain, constipation, diarrhea, nausea and vomiting.  Genitourinary: Negative for urgency.  Musculoskeletal: Negative for back pain and neck pain.  Skin: Negative for rash.  Neurological: Negative for speech change, weakness and headaches.         PHYSICAL EXAM:    Vitals:   01/08/20 1305  BP: 122/77  Pulse: 68  Resp: 20  Temp: 97.8 F (36.6 C)  SpO2: 99%    Filed Weights   01/08/20 1305  Weight: 166 lb 14.4 oz (75.7 kg)    Weight Date  166.9 pounds  01/08/2020  164.8 pounds  10/09/2019  160 pounds 2.8 ounces  05/27/2019  155 pounds 0.6 ounces  01/28/2019         Pre-treatment (RT consult date):     General: Nasiah Lehenbauer is a  59 y.o. male who appears to be in no acute distress.? Fredrick Geoghegan is unaccompanied today.    Physical Exam Constitutional:      General: He is not in acute distress.    Appearance: Normal appearance. He is not ill-appearing, toxic-appearing or diaphoretic.  HENT:     Head: Normocephalic and atraumatic.     Right Ear: Tympanic membrane, ear canal and external ear normal.     Left Ear: Tympanic membrane, ear canal and external ear normal.     Mouth/Throat:     Mouth: Mucous membranes are moist.     Pharynx: Oropharynx is clear. No oropharyngeal exudate or posterior oropharyngeal erythema.  Eyes:     General: No scleral icterus.       Right eye: No discharge.        Left eye: No discharge.     Conjunctiva/sclera: Conjunctivae normal.  Neck:     Comments: Skin appeared mildly thickened in the right neck. Cardiovascular:     Rate and Rhythm: Normal rate and regular rhythm.     Heart sounds: No murmur heard.  No friction rub. No gallop.   Pulmonary:     Effort: Pulmonary effort is normal. No respiratory distress.     Breath sounds: Normal breath sounds. No wheezing, rhonchi or rales.  Abdominal:     General: Abdomen is flat. There is no distension.     Palpations: Abdomen is soft.     Tenderness: There is no abdominal tenderness. There is no guarding or rebound.     Comments: There is a well-healed surgical incision in the left upper abdomen consistent with a previously placed PEG tube  Musculoskeletal:     Cervical back: Normal range of motion and neck  supple. No rigidity or tenderness.     Right lower leg: No edema.     Left lower leg: No edema.  Lymphadenopathy:     Cervical: No cervical adenopathy.  Skin:    General: Skin is warm and dry.     Coloration: Skin is not jaundiced or pale.     Findings: No bruising, erythema, lesion or rash.  Neurological:     General: No focal deficit present.     Mental Status: He is alert.     Coordination: Coordination normal.     Gait: Gait normal.  Psychiatric:        Mood and Affect: Mood normal.        Behavior: Behavior normal.        Thought Content: Thought content normal.        Judgment: Judgment normal.       LABORATORY DATA:    The patient's complete chemistry panel returned showing:   Lab Results  Component Value Date   NA 141 01/08/2020   K 4.1 01/08/2020   CL 106 01/08/2020   CO2 27 01/08/2020   GLUCOSE 84 01/08/2020   BUN 14 01/08/2020   CREATININE 1.26 (H) 01/08/2020   ALBUMIN 4.2 01/08/2020   ALKPHOS 59 01/08/2020   ALT 11 01/08/2020   AST 13 (L) 01/08/2020   BILITOT 0.5 01/08/2020        The patient's TSH returned at:  Lab Results  Component Value Date   TSH 2.144 01/08/2020   TSH 0.550 12/12/2017        The patient's complete blood count returned showing:      Component Value Date/Time   WBC 3.8 (L) 01/08/2020 1240   WBC 3.2 (L) 05/24/2018 1100  RBC 5.10 01/08/2020 1240   HGB 14.8 01/08/2020 1240   HCT 43.8 01/08/2020 1240   PLT 142 (L) 01/08/2020 1240   MCV 85.9 01/08/2020 1240   MCH 29.0 01/08/2020 1240   MCHC 33.8 01/08/2020 1240   RDW 12.3 01/08/2020 1240   LYMPHSABS 0.4 (L) 01/08/2020 1240   MONOABS 0.4 01/08/2020 1240   EOSABS 0.0 01/08/2020 1240   BASOSABS 0.0 01/08/2020 1240        DIAGNOSTIC IMAGING:    No results found.       ASSESSMENT & PLAN:    Tyris Eliot is a 59 y.o. male with a diagnosis of a Stage I (cT2, cN1, cM0, p16+) malignant neoplasm of tonsillar fossa.which was originally diagnosed on  11/12/2017. He was treated with concurrent chemoradiation with cisplatin dose from 12/28/2017 through 02/13/2018.  He presents to the survivorship clinic today for routine follow-up having last been seen on 10/09/2019 and having last been seen by Dr. Eppie Gibson on 05/21/2019.     1. Oral and oropharyngeal cancer ( malignant neoplasm of tonsillar fossa ):  Mr. Landrigan is clinically without evidence of residual or recurrence cancer on physical exam today.         2. Nutritional status: Mr. Cruise reports that he is currently able to consume adequate nutrition by mouth.  His weight is higher at 166.9 lbs today.  He was encouraged to continue to consume adequate hydration and nutrition, as tolerated.        3. At risk for dysphagia: Given Mr. Burbach treatment for oral and oropharyngeal cancer ( malignant neoplasm of tonsillar fossa ), which included concurrent chemoradiation, he maybe at risk for chronic dysphagia.  He reports having no difficulty with oral intake of foods. He is able to consume foods and liquids without difficulty.     4.  At risk for neck lymphedema:  When patients with head & neck cancers are treated with surgery and/or radiation therapy, there is an associated increased risk of neck lymphedema.  Mr. Stay reports that currently he is not experiencing symptoms.     5.  At risk for hypothyroidism: The thyroid gland is often affected after treatment for head & neck cancer.  Mr. Savage most recent TSH is as noted below: Lab Results  Component Value Date   TSH 2.144 01/08/2020   TSH 0.550 12/12/2017  We discussed that he will continue to have serial TSH monitoring for at least the next 5 years as part of his routine follow-up and post-cancer treatment care.          6. At risk for tooth decay/dental concerns: After treatment with radiation for head & neck cancers, patients often experience dry mouth which increases their risk of dental caries. Mr. Sedivy was encouraged to  see his dentist 3-4 times per year.       DISPOSITION:    -See Dr. Izora Gala (ENT) in February 2022  -Return to cancer center to see Dr. Dr. Lanell Persons only as needed  -He will return to see Dr. Chryl Heck in 8 months  -He will return to to be seen in the Survivorship Clinic in 1 year.     Sandi Mealy, MHS, PA-C [facility]  02/17/20   5:06 PM        NOTE: PRIMARY CARE PROVIDER   [place], Betty G, MD   Phone: [phone]   Fax: [phone]

## 2020-02-18 ENCOUNTER — Telehealth: Payer: Self-pay | Admitting: Medical

## 2020-02-18 NOTE — Telephone Encounter (Signed)
Scheduled appt per 11/30 sch msg - mailed reminder letter with appt date and time

## 2020-08-30 NOTE — Progress Notes (Deleted)
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 Redmond Baseman with 3 months of R neck mass   11/07/2017 Imaging   CT neck w/ contrast:  1. Right tonsillar mass measures up to 2.6 cm, highly concerning for a squamous cell carcinoma. 2. Enlarged homogeneous right level 2 lymph node measures up to 3.9 cm, consistent with metastatic disease. 3. Adjacent right level 3 lymph node measures 1.1 cm. 4. Lucency at C4 on the left is likely degenerative. Metastatic disease is not excluded. Please correlate with findings on PET scan if used for further evaluation.   11/12/2017 Procedure   R tonsillar biopsy by Dr. Janace Hoard   11/12/2017 Pathology Results   Accession: [id number] Invasive squamous cell carcinoma; p16 diffusely positive   11/28/2017 Imaging   PET scan: 1. Right tonsillar fossa mass is intensely hypermetabolic compatible with primary neoplasm. 2. Solitary enlarged and hypermetabolic right level 2 cervical lymph node compatible with metastatic adenopathy. 3. No evidence for FDG avid hypermetabolic distant metastasis.   11/30/2017 Cancer Staging   Staging form: Pharynx - HPV-Mediated Oropharynx, AJCC 8th Edition - Clinical stage from 11/30/2017: Stage I (cT2, cN1, cM0, p16+) - Signed by Eppie Gibson, MD on 11/30/2017   12/07/2017 Miscellaneous   ENT evaluation to [facility] recommended against surgical resection due to the tumor extending into the right inferior nasopharynx and posterior soft palate, which would  require resection of almost 1/2 of the soft palate and the right nasopharyngeal wall extending into right posterior/lateral posterior wall.    Procedure   12/26/2017: port and PEG tube placement    12/28/2017 - 01/25/2018 Chemotherapy   The patient had dexamethasone (DECADRON) 4 MG tablet, 1 of 1 cycle, Start date: 12/12/2017, End date: 03/06/2018 palonosetron (ALOXI) injection 0.25 mg, 0.25 mg, Intravenous,  Once, 2 of 3 cycles Administration: 0.25 mg (12/28/2017), 0.25 mg (01/25/2018) CISplatin (PLATINOL) 196 mg in sodium chloride 0.9 % 500 mL chemo infusion, 100 mg/m2 = 196 mg, Intravenous,  Once, 2 of 3 cycles Dose modification: 80 mg/m2 (original dose 100 mg/m2, Cycle 2, Reason: Dose not tolerated, Comment: Neutropenia) Administration: 196 mg (12/28/2017), 157 mg (01/25/2018) fosaprepitant (EMEND) 150 mg, dexamethasone (DECADRON) 12 mg in sodium chloride 0.9 % 145 mL IVPB, , Intravenous,  Once, 2 of 3 cycles Administration:  (12/28/2017),  (01/25/2018)  for chemotherapy treatment.          INTERVAL HISTORY:    Thomas Mccall is a 60 y.o. male with a diagnosis of a Stage I (cT2, cN1, cM0, p16+) malignant neoplasm of tonsillar fossa. He presents to clinic today for routine follow up having last been seen on 05/27/2019 by Dr. Tish Men and on 05/21/2019 by Dr. Eppie Gibson . He presents to the clinic today with his wife.  She reports that he has been sleeping more lately and appears to be more tired.  He also has episodes where he is feeling cold.  His  weight is stable.  He continues to have a dry mouth but no difficulty swallowing.  His wife would like to have an HPV DNA test ordered as she has been reviewing a study at [facility] which predicts the risk of disease recurrence based on HPV status.     REVIEW OF SYSTEMS:   Visual changes:    no   Swelling of the face:    no   Swelling of the mouth:    no   Swelling of the throat:    no   Dental problems:    no   Skin changes  at treatment site:  no   Dry mouth:     yes   Difficulty talking:    no  Thickened saliva:    no   Increased mucus:    no  Difficulty opening mouth:   no  Fatigue:     yes   Pain or difficulty swallowing or chewing: no    Loss of appetite:    no   Numbness of the ear:    no  Weakness raising the arm(s):  no  Lack of lip movement:    no  Facial disfigurement:    no  Numbness of face or neck:   no  Loss of voice or impaired speech:  no  Hearing difficulty:    no  Lymphedema:     no  Pain:      no       -Weight: has not lost weight      -Last ENT visit:    11/12/2017   -Last Rad Onc visit:   05/21/2019   -Last Dentist visit:   03/29/2018     CURRENT MEDICATIONS:    Current Outpatient Medications on File Prior to Visit  Medication Sig Dispense Refill   [DISCONTINUED] prochlorperazine (COMPAZINE) 10 MG tablet Take 1 tablet (10 mg total) by mouth every 6 (six) hours as needed (Nausea or vomiting). (Patient not taking: Reported on 05/21/2018) 30 tablet 1   No current facility-administered medications on file prior to visit.        ALLERGIES:    No Known Allergies      ADDITIONAL REVIEW OF SYSTEMS:    Review of Systems  Constitutional:  Positive for malaise/fatigue. Negative for chills, diaphoresis, fever and weight loss.  HENT:  Negative for congestion, ear discharge, ear pain, hearing loss, sore throat and tinnitus.   Respiratory:  Negative for cough, shortness of breath and wheezing.   Cardiovascular:  Negative for chest pain, palpitations and leg swelling.  Gastrointestinal:  Negative for abdominal pain, constipation, diarrhea, nausea and vomiting.  Genitourinary:  Negative for urgency.  Musculoskeletal:  Negative for back pain and neck pain.  Skin:  Negative for rash.  Neurological:  Positive for weakness. Negative for speech change and headaches.       PHYSICAL EXAM:    There were no vitals filed for this visit.   There were no vitals  filed for this visit.   Weight Date  164.8 10/09/2019  160 pounds 2.8 ounces  05/27/2019  155 pounds 0.6 ounces  01/28/2019            Pre-treatment (RT consult date):     General: Thomas Mccall is a 60 y.o. male who appears to be in no acute distress.? Thomas Mccall is accompanied/unaccompanied today.    Physical Exam Constitutional:      General: He is not in acute distress.  Appearance: Normal appearance. He is not ill-appearing, toxic-appearing or diaphoretic.  HENT:     Head: Normocephalic and atraumatic.     Right Ear: Tympanic membrane, ear canal and external ear normal.     Left Ear: Tympanic membrane, ear canal and external ear normal.     Mouth/Throat:     Mouth: Mucous membranes are moist.     Pharynx: Oropharynx is clear. No oropharyngeal exudate or posterior oropharyngeal erythema.  Eyes:     General: No scleral icterus.       Right eye: No discharge.        Left eye: No discharge.     Conjunctiva/sclera: Conjunctivae normal.  Neck:     Comments: Skin appeared mildly thickened in the right neck. Cardiovascular:     Rate and Rhythm: Normal rate and regular rhythm.     Heart sounds: No murmur heard.   No friction rub. No gallop.  Pulmonary:     Effort: Pulmonary effort is normal. No respiratory distress.     Breath sounds: Normal breath sounds. No wheezing, rhonchi or rales.  Abdominal:     General: Abdomen is flat. There is no distension.     Palpations: Abdomen is soft.     Tenderness: There is no abdominal tenderness. There is no guarding or rebound.     Comments: There is a well-healed surgical incision in the left upper abdomen consistent with a previously placed PEG tube  Musculoskeletal:     Cervical back: Normal range of motion and neck supple. No rigidity or tenderness.     Right lower leg: No edema.     Left lower leg: No edema.  Lymphadenopathy:     Cervical: No cervical adenopathy.  Skin:    General: Skin is warm and dry.      Coloration: Skin is not jaundiced or pale.     Findings: No bruising, erythema, lesion or rash.  Neurological:     General: No focal deficit present.     Mental Status: He is alert.     Coordination: Coordination normal.     Gait: Gait normal.  Psychiatric:        Mood and Affect: Mood normal.        Behavior: Behavior normal.        Thought Content: Thought content normal.        Judgment: Judgment normal.      LABORATORY DATA:    The patient's complete chemistry panel returned showing:   Lab Results  Component Value Date   NA 141 01/08/2020   K 4.1 01/08/2020   CL 106 01/08/2020   CO2 27 01/08/2020   GLUCOSE 84 01/08/2020   BUN 14 01/08/2020   CREATININE 1.26 (H) 01/08/2020   ALBUMIN 4.2 01/08/2020   ALKPHOS 59 01/08/2020   ALT 11 01/08/2020   AST 13 (L) 01/08/2020   BILITOT 0.5 01/08/2020        The patient's TSH returned at:  Lab Results  Component Value Date   TSH 2.144 01/08/2020   TSH 0.550 12/12/2017        The patient's complete blood count returned showing:      Component Value Date/Time   WBC 3.8 (L) 01/08/2020 1240   WBC 3.2 (L) 05/24/2018 1100   RBC 5.10 01/08/2020 1240   HGB 14.8 01/08/2020 1240   HCT 43.8 01/08/2020 1240   PLT 142 (L) 01/08/2020 1240   MCV 85.9 01/08/2020 1240   MCH 29.0 01/08/2020 1240  MCHC 33.8 01/08/2020 1240   RDW 12.3 01/08/2020 1240   LYMPHSABS 0.4 (L) 01/08/2020 1240   MONOABS 0.4 01/08/2020 1240   EOSABS 0.0 01/08/2020 1240   BASOSABS 0.0 01/08/2020 1240        DIAGNOSTIC IMAGING:    No results found.       ASSESSMENT & PLAN:    Thomas Mccall is a 60 y.o. male with a diagnosis of a Stage I (cT2, cN1, cM0, p16+) malignant neoplasm of tonsillar fossa.which was originally diagnosed on 11/12/2017. He was treated with concurrent chemoradiation with cisplatin dose from 12/28/2017 through 02/13/2018.  He presents to the survivorship clinic today for routine follow-up having last been seen on  05/27/2019 by Dr. Tish Men and on 05/21/2019 by Dr. Eppie Gibson.     1. Oral and oropharyngeal cancer ( malignant neoplasm of tonsillar fossa ):  Thomas Mccall is clinically without evidence of residual or recurrence cancer on physical exam today.         2. Nutritional status: Thomas Mccall reports that he is currently Mccall to consume adequate nutrition by mouth.  His weight is higher at 164.8 lbs today.  He was encouraged to continue to consume adequate hydration and nutrition, as tolerated.        3. At risk for dysphagia: Given Thomas Mccall treatment for oral and oropharyngeal cancer ( malignant neoplasm of tonsillar fossa ), which included concurrent chemoradiation, he maybe at risk for chronic dysphagia.  He reports having no difficulty with oral intake of foods. He is Mccall to consume foods and liquids without difficulty.     4.  At risk for neck lymphedema:  When patients with head & neck cancers are treated with surgery and/or radiation therapy, there is an associated increased risk of neck lymphedema.  Thomas Mccall reports that currently he is not experiencing symptoms.     5.  At risk for hypothyroidism: The thyroid gland is often affected after treatment for head & neck cancer.  Thomas Mccall most recent TSH is as noted below: Lab Results  Component Value Date   TSH 2.144 01/08/2020   TSH 0.550 12/12/2017  We discussed that he will continue to have serial TSH monitoring for at least the next 5 years as part of his routine follow-up and post-cancer treatment care.          6. At risk for tooth decay/dental concerns: After treatment with radiation for head & neck cancers, patients often experience dry mouth which increases their risk of dental caries. Mr. Stefanski was encouraged to see his dentist 3-4 times per year.       DISPOSITION:    -See Dr. Izora Gala (ENT) on 10/23/2019  -Return to cancer center to see Dr. Dr. Lanell Persons (to be scheduled)  -Return to cancer center to see  Survivorship PA in 3 months    Sandi Mealy, MHS, PA-C [facility]  08/30/20   10:11 PM        NOTE: PRIMARY CARE PROVIDER   [place], Betty G, MD   Phone: [phone]   Fax: [phone]

## 2020-08-31 ENCOUNTER — Inpatient Hospital Stay: Payer: 59

## 2020-08-31 ENCOUNTER — Other Ambulatory Visit: Payer: Self-pay

## 2020-08-31 ENCOUNTER — Encounter: Payer: Self-pay | Admitting: Hematology and Oncology

## 2020-08-31 ENCOUNTER — Inpatient Hospital Stay: Payer: 59 | Attending: Hematology and Oncology | Admitting: Hematology and Oncology

## 2020-08-31 VITALS — BP 126/72 | HR 68 | Temp 97.7°F | Resp 18 | Ht 70.0 in | Wt 168.8 lb

## 2020-08-31 DIAGNOSIS — Z85818 Personal history of malignant neoplasm of other sites of lip, oral cavity, and pharynx: Secondary | ICD-10-CM | POA: Insufficient documentation

## 2020-08-31 DIAGNOSIS — Z9221 Personal history of antineoplastic chemotherapy: Secondary | ICD-10-CM | POA: Insufficient documentation

## 2020-08-31 DIAGNOSIS — Z923 Personal history of irradiation: Secondary | ICD-10-CM | POA: Insufficient documentation

## 2020-08-31 DIAGNOSIS — C090 Malignant neoplasm of tonsillar fossa: Secondary | ICD-10-CM | POA: Diagnosis not present

## 2020-08-31 DIAGNOSIS — D72819 Decreased white blood cell count, unspecified: Secondary | ICD-10-CM | POA: Diagnosis not present

## 2020-08-31 LAB — CMP (CANCER CENTER ONLY)
ALT: 14 U/L (ref 0–44)
AST: 14 U/L — ABNORMAL LOW (ref 15–41)
Albumin: 4.1 g/dL (ref 3.5–5.0)
Alkaline Phosphatase: 67 U/L (ref 38–126)
Anion gap: 8 (ref 5–15)
BUN: 12 mg/dL (ref 8–23)
CO2: 27 mmol/L (ref 22–32)
Calcium: 9.5 mg/dL (ref 8.9–10.3)
Chloride: 106 mmol/L (ref 98–111)
Creatinine: 1.14 mg/dL (ref 0.61–1.24)
GFR, Estimated: >60 mL/min (ref >60)
Glucose, Bld: 97 mg/dL (ref 70–99)
Potassium: 4.4 mmol/L (ref 3.5–5.1)
Sodium: 141 mmol/L (ref 135–145)
Total Bilirubin: 0.5 mg/dL (ref 0.3–1.2)
Total Protein: 7.4 g/dL (ref 6.5–8.1)

## 2020-08-31 LAB — CBC WITH DIFFERENTIAL/PLATELET
Abs Immature Granulocytes: 0.01 10*3/uL (ref 0.00–0.07)
Basophils Absolute: 0 10*3/uL (ref 0.0–0.1)
Basophils Relative: 1 %
Eosinophils Absolute: 0.1 10*3/uL (ref 0.0–0.5)
Eosinophils Relative: 2 %
HCT: 43.8 % (ref 39.0–52.0)
Hemoglobin: 14.7 g/dL (ref 13.0–17.0)
Immature Granulocytes: 0 %
Lymphocytes Relative: 17 %
Lymphs Abs: 0.6 10*3/uL — ABNORMAL LOW (ref 0.7–4.0)
MCH: 28.7 pg (ref 26.0–34.0)
MCHC: 33.6 g/dL (ref 30.0–36.0)
MCV: 85.4 fL (ref 80.0–100.0)
Monocytes Absolute: 0.3 10*3/uL (ref 0.1–1.0)
Monocytes Relative: 9 %
Neutro Abs: 2.5 10*3/uL (ref 1.7–7.7)
Neutrophils Relative %: 71 %
Platelets: 197 10*3/uL (ref 150–400)
RBC: 5.13 MIL/uL (ref 4.22–5.81)
RDW: 12.3 % (ref 11.5–15.5)
WBC: 3.5 10*3/uL — ABNORMAL LOW (ref 4.0–10.5)
nRBC: 0 % (ref 0.0–0.2)

## 2020-08-31 LAB — TSH: TSH: 3.067 u[IU]/mL (ref 0.320–4.118)

## 2020-08-31 NOTE — Progress Notes (Signed)
[facility] OFFICE PROGRESS NOTE  Patient Care Team: [place], Betty G, MD as PCP - General (Family Medicine) Eppie Gibson, MD as Attending Physician (Radiation Oncology) Tish Men, MD (Inactive) as Consulting Physician (Hematology) Karie Mainland, RD as Dietitian (Nutrition) Valentino Saxon [place], CCC-SLP as Speech Language Pathologist (Speech Pathology) Wynelle Beckmann, Melodie Bouillon, PT as Physical Therapist (Physical Therapy) Izora Gala, MD as Consulting Physician (Otolaryngology)  HEME/ONC OVERVIEW: 1. 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 Constance Holster unremarkable in  Aug 2021, he wasn't seen since then. -Surveillance plan:  -q2-68months x 1 year, then q3months x 1 year, then q40months x 3 years  -q3-66month thyroid function monitoring  -Fiberoptic exam by ENT -Periodic TSH monitoring, labs ordered today. - He needs to schedule FU with Dr Constance Holster ASAP, reinforced this.  Leukopenia -Last labs in 10/21 showed WBC of 3.8 which is near normal - Clinically, patient denies any symptoms of infection -I counseled the patient on the importance of keep up-to-date with age appropriate cancer screening - Labs today are pending  Orders Placed This Encounter  Procedures   CBC with Differential/Platelet    Standing Status:   Standing    Number of Occurrences:   22    Standing Expiration Date:   08/31/2021   CMP  ([facility] only)    Standing Status:   Future    Standing Expiration Date:   08/31/2021   TSH    Standing Status:   Standing    Number of Occurrences:   22    Standing Expiration Date:   08/31/2021   Return in 6 months for labs and clinic follow-up with H&N survivorship clinic.   [name], MD 6/14/20221:12 PM  CHIEF COMPLAINT: Follow for SCC tonsil, establishing with me  INTERVAL HISTORY: [name] returns to clinic for follow-up of squamous cell carcinoma of the right tonsil on surveillance.   Since his last visit with Dr Maylon Peppers in early 2021, he has been doing well He complains of some tightness in his neck from treatment. Mild dry mouth, doesn't limit him from eating. Weight is maintaining well No hearing loss, neuropathy No change in breathing, bowel or urinary habits. He cant remember the last time he saw Dr Constance Holster.  SUMMARY OF ONCOLOGIC HISTORY: Oncology History  Malignant neoplasm of tonsillar fossa ([place]) (Resolved)  10/30/2017 Miscellaneous   Presented to Dr. Redmond Baseman with 3 months of R neck mass    11/07/2017 Imaging   CT neck w/ contrast:  1. Right tonsillar mass measures up to 2.6 cm, highly concerning for a squamous cell carcinoma. 2. Enlarged homogeneous right level 2 lymph node measures up to 3.9 cm, consistent with metastatic disease. 3. Adjacent right level 3 lymph node measures 1.1 cm. 4. Lucency at C4 on the left is likely degenerative. Metastatic disease is not excluded. Please correlate with findings on PET scan if used for further evaluation.    11/12/2017 Procedure   R tonsillar biopsy by Dr. Janace Hoard  11/12/2017 Pathology Results   Accession: [id number] Invasive squamous cell carcinoma; p16 diffusely positive    11/28/2017 Imaging   PET scan: 1. Right tonsillar fossa mass is intensely hypermetabolic compatible with primary neoplasm. 2. Solitary enlarged and hypermetabolic right level 2 cervical lymph node compatible with metastatic  adenopathy. 3. No evidence for FDG avid hypermetabolic distant metastasis.    11/30/2017 Cancer Staging   Staging form: Pharynx - HPV-Mediated Oropharynx, AJCC 8th Edition - Clinical stage from 11/30/2017: Stage I (cT2, cN1, cM0, p16+) - Signed by Eppie Gibson, MD on 11/30/2017    12/07/2017 Miscellaneous   ENT evaluation to [facility] recommended against surgical resection due to the tumor extending into the right inferior nasopharynx and posterior soft palate, which would require resection of almost 1/2 of the soft palate and the right nasopharyngeal wall extending into right posterior/lateral posterior wall.     Procedure   12/26/2017: port and PEG tube placement     12/28/2017 - 01/25/2018 Chemotherapy   The patient had dexamethasone (DECADRON) 4 MG tablet, 1 of 1 cycle, Start date: 12/12/2017, End date: 03/06/2018 palonosetron (ALOXI) injection 0.25 mg, 0.25 mg, Intravenous,  Once, 2 of 3 cycles Administration: 0.25 mg (12/28/2017), 0.25 mg (01/25/2018) CISplatin (PLATINOL) 196 mg in sodium chloride 0.9 % 500 mL chemo infusion, 100 mg/m2 = 196 mg, Intravenous,  Once, 2 of 3 cycles Dose modification: 80 mg/m2 (original dose 100 mg/m2, Cycle 2, Reason: Dose not tolerated, Comment: Neutropenia) Administration: 196 mg (12/28/2017), 157 mg (01/25/2018) fosaprepitant (EMEND) 150 mg, dexamethasone (DECADRON) 12 mg in sodium chloride 0.9 % 145 mL IVPB, , Intravenous,  Once, 2 of 3 cycles Administration:  (12/28/2017),  (01/25/2018)   for chemotherapy treatment.       No change in PMH/PSH/FH/SH  ALLERGIES:  has No Known Allergies.  MEDICATIONS:  No current outpatient medications on file.   No current facility-administered medications for this visit.    PHYSICAL EXAMINATION: ECOG PERFORMANCE STATUS: 0 - Asymptomatic  Today's Vitals   08/31/20 1240 08/31/20 1243  BP: 126/72   Pulse: 68   Resp: 18   Temp: 97.7 F (36.5 C)   TempSrc: Tympanic   SpO2: 98%   Weight: 168 lb 12.8 oz  (76.6 kg)   Height: 5\' 10"  (1.778 m)   PainSc:  0-No pain   Body mass index is 24.22 kg/m.  Filed Weights   08/31/20 1240  Weight: 168 lb 12.8 oz (76.6 kg)    Physical Exam Constitutional:      Appearance: Normal appearance.  HENT:     Head: Normocephalic and atraumatic.     Mouth/Throat:     Comments: He had a strong gag, but within the limits of the exam, I didn't see any tonsillar masses or oral cavity masses. Cardiovascular:     Rate and Rhythm: Normal rate and regular rhythm.     Pulses: Normal pulses.     Heart sounds: Normal heart sounds.  Pulmonary:     Effort: Pulmonary effort is normal.     Breath sounds: Normal breath sounds.  Abdominal:     General: Abdomen is flat. Bowel sounds are normal.  Musculoskeletal:     Cervical back: Rigidity (neck tightness noted likely from post radiation) present.  Neurological:     Mental Status: He is alert.     LABORATORY DATA:  I have reviewed the data as listed    Component Value Date/Time   NA 141 01/08/2020 1240   K 4.1 01/08/2020 1240  CL 106 01/08/2020 1240   CO2 27 01/08/2020 1240   GLUCOSE 84 01/08/2020 1240   BUN 14 01/08/2020 1240   CREATININE 1.26 (H) 01/08/2020 1240   CALCIUM 9.7 01/08/2020 1240   PROT 7.2 01/08/2020 1240   ALBUMIN 4.2 01/08/2020 1240   AST 13 (L) 01/08/2020 1240   ALT 11 01/08/2020 1240   ALKPHOS 59 01/08/2020 1240   BILITOT 0.5 01/08/2020 1240   GFRNONAA >60 01/08/2020 1240   GFRAA >60 10/09/2019 1509    No results found for: SPEP, UPEP  Lab Results  Component Value Date   WBC 3.8 (L) 01/08/2020   NEUTROABS 2.9 01/08/2020   HGB 14.8 01/08/2020   HCT 43.8 01/08/2020   MCV 85.9 01/08/2020   PLT 142 (L) 01/08/2020      Chemistry      Component Value Date/Time   NA 141 01/08/2020 1240   K 4.1 01/08/2020 1240   CL 106 01/08/2020 1240   CO2 27 01/08/2020 1240   BUN 14 01/08/2020 1240   CREATININE 1.26 (H) 01/08/2020 1240      Component Value Date/Time   CALCIUM 9.7  01/08/2020 1240   ALKPHOS 59 01/08/2020 1240   AST 13 (L) 01/08/2020 1240   ALT 11 01/08/2020 1240   BILITOT 0.5 01/08/2020 1240       RADIOGRAPHIC STUDIES: I spent 30 minutes or over in the care of this patient including H and P, review of records, counseling and coordination of care. I have personally reviewed the records from the other providers, discussed surveillance plan, role of TSH monitoring, need to FU with ENT and age appropriate cancer screening.   [name] MD

## 2021-01-10 ENCOUNTER — Encounter: Payer: 59 | Admitting: Medical

## 2021-01-18 DIAGNOSIS — R682 Dry mouth, unspecified: Secondary | ICD-10-CM | POA: Diagnosis not present

## 2021-01-18 DIAGNOSIS — C4492 Squamous cell carcinoma of skin, unspecified: Secondary | ICD-10-CM | POA: Diagnosis not present

## 2021-03-01 NOTE — Progress Notes (Signed)
[place] OFFICE PROGRESS NOTE  Patient Care Team: Martinique, Betty G, MD as PCP - General (Family Medicine) Eppie Gibson, MD as Attending Physician (Radiation Oncology) Tish Men, MD (Inactive) as Consulting Physician (Hematology) Karie Mainland, RD as Dietitian (Nutrition) Valentino Saxon [place], CCC-SLP as Speech Language Pathologist (Speech Pathology) Wynelle Beckmann, Melodie Bouillon, PT as Physical Therapist (Physical Therapy) Izora Gala, MD as Consulting Physician (Otolaryngology)  HEME/ONC OVERVIEW:  1. 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 Constance Holster unremarkable in  Aug 2022, no concerns.RTC recommended in 4 months or sooner PRN. -Surveillance plan:  -q2-63months x 1 year, then q72months x 1 year, then q3months x 3 years, after this as directed by ENT  -q 4month thyroid function monitoring, last TSH in June was normal, will repeat CBC, CMP and TSH today.  - Will engage with ENT survivorship clinic No evidence of recurrence on exam today.  He can return to clinic once a year.  He can get an annual TSH monitored by his primary care physician and he is in agreement with this.  Leukopenia WBC in June 2022 count of 3.5, no clinical concerns, can continue to monitor. No concerns for infections.  Will monitor the white blood cell count on complete blood count today.  If stable, this can be clinically monitored.    Orders Placed This Encounter  Procedures    Comprehensive metabolic panel    Standing Status:   Standing    Number of Occurrences:   33    Standing Expiration Date:   03/02/2022   Return in 1 year for follow-up or sooner if needed. [name], MD 12/14/20223:41 PM  CHIEF COMPLAINT: Follow for SCC tonsil,   INTERVAL HISTORY: Mr. Cu returns to clinic for follow-up of squamous cell carcinoma of the right tonsil on surveillance.    SUMMARY OF ONCOLOGIC HISTORY: Oncology History  Malignant neoplasm of tonsillar fossa ([place]) (Resolved)  10/30/2017 Miscellaneous   Presented to Dr. Redmond Baseman with 3 months of R neck mass   11/07/2017 Imaging   CT neck w/ contrast:  1. Right tonsillar mass measures up to 2.6 cm, highly concerning for a squamous cell carcinoma. 2. Enlarged homogeneous right level 2 lymph node measures up to 3.9 cm, consistent with metastatic disease. 3. Adjacent right level 3 lymph node measures 1.1 cm. 4. Lucency at C4 on the left is likely degenerative. Metastatic disease is not excluded. Please correlate with findings on PET scan if used for further evaluation.   11/12/2017 Procedure   R tonsillar biopsy by Dr. Janace Hoard   11/12/2017 Pathology Results   Accession: [id number] Invasive squamous cell carcinoma; p16 diffusely positive   11/28/2017 Imaging   PET scan: 1. Right tonsillar fossa mass is intensely hypermetabolic compatible with primary neoplasm. 2. Solitary enlarged and hypermetabolic right level 2 cervical lymph node compatible with metastatic adenopathy. 3. No evidence for FDG avid hypermetabolic distant metastasis.   11/30/2017 Cancer Staging   Staging form: Pharynx - HPV-Mediated Oropharynx, AJCC 8th Edition - Clinical stage from 11/30/2017: Stage  I (cT2, cN1, cM0, p16+) - Signed by Eppie Gibson, MD on 11/30/2017    12/07/2017 Miscellaneous   ENT evaluation to [facility] recommended against surgical resection due to the tumor extending into the right inferior nasopharynx and posterior soft palate,  which would require resection of almost 1/2 of the soft palate and the right nasopharyngeal wall extending into right posterior/lateral posterior wall.    Procedure   12/26/2017: port and PEG tube placement    12/28/2017 - 01/25/2018 Chemotherapy   The patient had dexamethasone (DECADRON) 4 MG tablet, 1 of 1 cycle, Start date: 12/12/2017, End date: 03/06/2018 palonosetron (ALOXI) injection 0.25 mg, 0.25 mg, Intravenous,  Once, 2 of 3 cycles Administration: 0.25 mg (12/28/2017), 0.25 mg (01/25/2018) CISplatin (PLATINOL) 196 mg in sodium chloride 0.9 % 500 mL chemo infusion, 100 mg/m2 = 196 mg, Intravenous,  Once, 2 of 3 cycles Dose modification: 80 mg/m2 (original dose 100 mg/m2, Cycle 2, Reason: Dose not tolerated, Comment: Neutropenia) Administration: 196 mg (12/28/2017), 157 mg (01/25/2018) fosaprepitant (EMEND) 150 mg, dexamethasone (DECADRON) 12 mg in sodium chloride 0.9 % 145 mL IVPB, , Intravenous,  Once, 2 of 3 cycles Administration:  (12/28/2017),  (01/25/2018)   for chemotherapy treatment.      Since last visit, no new clinical concerns.  No change in swallowing, speech, sore throat.  No hearing loss or new neuropathy.  No change in breathing, bowel habits or urinary habits.  No interim infections or hospitalizations.  No change in PMH/PSH/FH/SH  ALLERGIES:  has No Known Allergies.  MEDICATIONS:  No current outpatient medications on file.   No current facility-administered medications for this visit.    PHYSICAL EXAMINATION: ECOG PERFORMANCE STATUS: 0 - Asymptomatic  Today's Vitals   03/02/21 1504  BP: (!) 141/89  Pulse: 87  Resp: 17  Temp: 97.7 F (36.5 C)  TempSrc: Tympanic  SpO2: 99%  Weight: 176 lb 3 oz (79.9 kg)   Body mass index is 25.28 kg/m.  Filed Weights   03/02/21 1504  Weight: 176 lb 3 oz (79.9 kg)    Physical Exam Constitutional:      Appearance: Normal appearance.  HENT:     Head: Normocephalic and atraumatic.     Mouth/Throat:     Comments:  He had a strong gag, but within the limits of the exam, I didn't see any tonsillar masses or oral cavity masses. Cardiovascular:     Rate and Rhythm: Normal rate and regular rhythm.     Pulses: Normal pulses.     Heart sounds: Normal heart sounds.  Pulmonary:     Effort: Pulmonary effort is normal.     Breath sounds: Normal breath sounds.  Abdominal:     General: Abdomen is flat. Bowel sounds are normal.  Musculoskeletal:     Cervical back: Rigidity (neck tightness noted likely from post radiation) present.  Neurological:     Mental Status: He is alert.     LABORATORY DATA:  I have reviewed the data as listed    Component Value Date/Time   NA 141 08/31/2020 1320   K 4.4 08/31/2020 1320   CL 106 08/31/2020 1320   CO2 27 08/31/2020 1320   GLUCOSE 97 08/31/2020 1320   BUN 12 08/31/2020 1320   CREATININE 1.14 08/31/2020 1320   CALCIUM 9.5 08/31/2020 1320   PROT 7.4 08/31/2020 1320   ALBUMIN 4.1 08/31/2020 1320   AST 14 (L) 08/31/2020 1320   ALT 14 08/31/2020 1320   ALKPHOS 67 08/31/2020 1320  BILITOT 0.5 08/31/2020 1320   GFRNONAA >60 08/31/2020 1320   GFRAA >60 10/09/2019 1509    No results found for: SPEP, UPEP  Lab Results  Component Value Date   WBC 3.5 (L) 08/31/2020   NEUTROABS 2.5 08/31/2020   HGB 14.7 08/31/2020   HCT 43.8 08/31/2020   MCV 85.4 08/31/2020   PLT 197 08/31/2020      Chemistry      Component Value Date/Time   NA 141 08/31/2020 1320   K 4.4 08/31/2020 1320   CL 106 08/31/2020 1320   CO2 27 08/31/2020 1320   BUN 12 08/31/2020 1320   CREATININE 1.14 08/31/2020 1320      Component Value Date/Time   CALCIUM 9.5 08/31/2020 1320   ALKPHOS 67 08/31/2020 1320   AST 14 (L) 08/31/2020 1320   ALT 14 08/31/2020 1320   BILITOT 0.5 08/31/2020 1320       RADIOGRAPHIC STUDIES: I spent 20 minutes or over in the care of this patient including H and P, review of records, counseling and coordination of care. I have personally reviewed the records  from the other providers, discussed surveillance plan, role of TSH monitoring, need to FU with ENT and age appropriate cancer screening.   [name] MD

## 2021-03-02 ENCOUNTER — Inpatient Hospital Stay: Payer: 59

## 2021-03-02 ENCOUNTER — Inpatient Hospital Stay: Payer: 59 | Attending: Hematology and Oncology | Admitting: Hematology and Oncology

## 2021-03-02 ENCOUNTER — Encounter: Payer: Self-pay | Admitting: Hematology and Oncology

## 2021-03-02 ENCOUNTER — Other Ambulatory Visit: Payer: Self-pay

## 2021-03-02 VITALS — BP 141/89 | HR 87 | Temp 97.7°F | Resp 17 | Wt 176.2 lb

## 2021-03-02 DIAGNOSIS — C090 Malignant neoplasm of tonsillar fossa: Secondary | ICD-10-CM | POA: Insufficient documentation

## 2021-03-02 DIAGNOSIS — D72819 Decreased white blood cell count, unspecified: Secondary | ICD-10-CM | POA: Diagnosis not present

## 2021-03-02 DIAGNOSIS — Z79899 Other long term (current) drug therapy: Secondary | ICD-10-CM | POA: Diagnosis not present

## 2021-03-02 LAB — COMPREHENSIVE METABOLIC PANEL
ALT: 21 U/L (ref 0–44)
AST: 17 U/L (ref 15–41)
Albumin: 4.4 g/dL (ref 3.5–5.0)
Alkaline Phosphatase: 62 U/L (ref 38–126)
Anion gap: 8 (ref 5–15)
BUN: 15 mg/dL (ref 8–23)
CO2: 27 mmol/L (ref 22–32)
Calcium: 9.6 mg/dL (ref 8.9–10.3)
Chloride: 108 mmol/L (ref 98–111)
Creatinine, Ser: 1.23 mg/dL (ref 0.61–1.24)
GFR, Estimated: >60 mL/min (ref >60)
Glucose, Bld: 96 mg/dL (ref 70–99)
Potassium: 4.4 mmol/L (ref 3.5–5.1)
Sodium: 143 mmol/L (ref 135–145)
Total Bilirubin: 0.5 mg/dL (ref 0.3–1.2)
Total Protein: 7.2 g/dL (ref 6.5–8.1)

## 2021-03-02 LAB — CBC WITH DIFFERENTIAL/PLATELET
Abs Immature Granulocytes: 0.01 10*3/uL (ref 0.00–0.07)
Basophils Absolute: 0 10*3/uL (ref 0.0–0.1)
Basophils Relative: 0 %
Eosinophils Absolute: 0 10*3/uL (ref 0.0–0.5)
Eosinophils Relative: 1 %
HCT: 46 % (ref 39.0–52.0)
Hemoglobin: 15.2 g/dL (ref 13.0–17.0)
Immature Granulocytes: 0 %
Lymphocytes Relative: 13 %
Lymphs Abs: 0.6 10*3/uL — ABNORMAL LOW (ref 0.7–4.0)
MCH: 28.3 pg (ref 26.0–34.0)
MCHC: 33 g/dL (ref 30.0–36.0)
MCV: 85.7 fL (ref 80.0–100.0)
Monocytes Absolute: 0.3 10*3/uL (ref 0.1–1.0)
Monocytes Relative: 7 %
Neutro Abs: 3.6 10*3/uL (ref 1.7–7.7)
Neutrophils Relative %: 79 %
Platelets: 183 10*3/uL (ref 150–400)
RBC: 5.37 MIL/uL (ref 4.22–5.81)
RDW: 12.7 % (ref 11.5–15.5)
WBC: 4.6 10*3/uL (ref 4.0–10.5)
nRBC: 0 % (ref 0.0–0.2)

## 2021-03-03 LAB — TSH: TSH: 2.336 u[IU]/mL (ref 0.320–4.118)

## 2022-01-02 DIAGNOSIS — C4492 Squamous cell carcinoma of skin, unspecified: Secondary | ICD-10-CM | POA: Diagnosis not present

## 2022-01-02 DIAGNOSIS — R682 Dry mouth, unspecified: Secondary | ICD-10-CM | POA: Diagnosis not present

## 2022-03-01 ENCOUNTER — Inpatient Hospital Stay: Payer: 59

## 2022-03-01 ENCOUNTER — Inpatient Hospital Stay: Payer: 59 | Attending: Hematology and Oncology | Admitting: Hematology and Oncology

## 2022-03-01 VITALS — BP 132/79 | HR 79 | Temp 98.1°F | Resp 16 | Ht 70.0 in | Wt 170.2 lb

## 2022-03-01 DIAGNOSIS — Z9221 Personal history of antineoplastic chemotherapy: Secondary | ICD-10-CM | POA: Diagnosis not present

## 2022-03-01 DIAGNOSIS — Z923 Personal history of irradiation: Secondary | ICD-10-CM | POA: Diagnosis not present

## 2022-03-01 DIAGNOSIS — C090 Malignant neoplasm of tonsillar fossa: Secondary | ICD-10-CM | POA: Diagnosis not present

## 2022-03-01 DIAGNOSIS — Z85818 Personal history of malignant neoplasm of other sites of lip, oral cavity, and pharynx: Secondary | ICD-10-CM | POA: Insufficient documentation

## 2022-03-01 LAB — CBC WITH DIFFERENTIAL/PLATELET
Abs Immature Granulocytes: 0 10*3/uL (ref 0.00–0.07)
Basophils Absolute: 0 10*3/uL (ref 0.0–0.1)
Basophils Relative: 1 %
Eosinophils Absolute: 0 10*3/uL (ref 0.0–0.5)
Eosinophils Relative: 1 %
HCT: 43.8 % (ref 39.0–52.0)
Hemoglobin: 14.8 g/dL (ref 13.0–17.0)
Immature Granulocytes: 0 %
Lymphocytes Relative: 15 %
Lymphs Abs: 0.7 10*3/uL (ref 0.7–4.0)
MCH: 29.2 pg (ref 26.0–34.0)
MCHC: 33.8 g/dL (ref 30.0–36.0)
MCV: 86.6 fL (ref 80.0–100.0)
Monocytes Absolute: 0.3 10*3/uL (ref 0.1–1.0)
Monocytes Relative: 8 %
Neutro Abs: 3.3 10*3/uL (ref 1.7–7.7)
Neutrophils Relative %: 75 %
Platelets: 173 10*3/uL (ref 150–400)
RBC: 5.06 MIL/uL (ref 4.22–5.81)
RDW: 12.5 % (ref 11.5–15.5)
WBC: 4.4 10*3/uL (ref 4.0–10.5)
nRBC: 0 % (ref 0.0–0.2)

## 2022-03-01 LAB — CMP (CANCER CENTER ONLY)
ALT: 17 U/L (ref 0–44)
AST: 20 U/L (ref 15–41)
Albumin: 4.4 g/dL (ref 3.5–5.0)
Alkaline Phosphatase: 57 U/L (ref 38–126)
Anion gap: 7 (ref 5–15)
BUN: 12 mg/dL (ref 8–23)
CO2: 30 mmol/L (ref 22–32)
Calcium: 9.3 mg/dL (ref 8.9–10.3)
Chloride: 105 mmol/L (ref 98–111)
Creatinine: 1.22 mg/dL (ref 0.61–1.24)
GFR, Estimated: >60 mL/min (ref >60)
Glucose, Bld: 101 mg/dL — ABNORMAL HIGH (ref 70–99)
Potassium: 3.9 mmol/L (ref 3.5–5.1)
Sodium: 142 mmol/L (ref 135–145)
Total Bilirubin: 0.6 mg/dL (ref 0.3–1.2)
Total Protein: 7.2 g/dL (ref 6.5–8.1)

## 2022-03-01 NOTE — Progress Notes (Unsigned)
[place] OFFICE PROGRESS NOTE  Patient Care Team: Martinique, Betty G, MD as PCP - General (Family Medicine) Eppie Gibson, MD as Attending Physician (Radiation Oncology) Tish Men, MD (Inactive) as Consulting Physician (Hematology) Karie Mainland, RD as Dietitian (Nutrition) Valentino Saxon [place], CCC-SLP as Speech Language Pathologist (Speech Pathology) Wynelle Beckmann, Melodie Bouillon, PT as Physical Therapist (Physical Therapy) Izora Gala, MD as Consulting Physician (Otolaryngology)  HEME/ONC OVERVIEW:  1. 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 Constance Holster unremarkable in  Aug 2022, no concerns.RTC recommended in 4 months or sooner PRN. He is currently following up with Dr. Constance Holster, recently had exam which showed no concern for recurrence.  He was really worried since he felt some swelling in the right side of the neck.  There is no definitive palpable lymphadenopathy but I appreciate some likely  postradiation changes today.  We have however discussed about considering CT neck to confirm.  Patient wanted to think about it and will call us back if he decides to proceed with it.  No orders of the defined types were placed in this encounter.  Return in 1 year for follow-up or sooner if needed. Thomas Pike, MD 12/13/20233:36 PM  CHIEF COMPLAINT: Follow for SCC tonsil,   INTERVAL HISTORY: Thomas Mccall returns to clinic for follow-up of squamous cell carcinoma  of the right tonsil on surveillance.    SUMMARY OF ONCOLOGIC HISTORY: Oncology History  Malignant neoplasm of tonsillar fossa ([place]) (Resolved)  10/30/2017 Miscellaneous   Presented to Dr. Redmond Baseman with 3 months of R neck mass   11/07/2017 Imaging   CT neck w/ contrast:  1. Right tonsillar mass measures up to 2.6 cm, highly concerning for a squamous cell carcinoma. 2. Enlarged homogeneous right level 2 lymph node measures up to 3.9 cm, consistent with metastatic disease. 3. Adjacent right level 3 lymph node measures 1.1 cm. 4. Lucency at C4 on the left is likely degenerative. Metastatic disease is not excluded. Please correlate with findings on PET scan if used for further evaluation.   11/12/2017 Procedure   R tonsillar biopsy by Dr. Janace Hoard   11/12/2017 Pathology Results   Accession: [id number] Invasive squamous cell carcinoma; p16 diffusely positive   11/28/2017 Imaging   PET scan: 1. Right tonsillar fossa mass is intensely hypermetabolic compatible with primary neoplasm. 2. Solitary enlarged and hypermetabolic right level 2 cervical lymph node compatible with metastatic adenopathy. 3. No evidence for FDG avid hypermetabolic distant metastasis.   11/30/2017 Cancer Staging   Staging form: Pharynx - HPV-Mediated Oropharynx, AJCC 8th Edition - Clinical stage from 11/30/2017: Stage I (cT2, cN1, cM0, p16+) - Signed by Eppie Gibson, MD on 11/30/2017   12/07/2017 Miscellaneous   ENT [facility] to [facility] recommended against surgical resection due to the tumor extending into the right inferior nasopharynx and posterior soft palate, which would require resection of almost 1/2 of the soft palate and the right nasopharyngeal wall extending into right posterior/lateral posterior wall.    Procedure   12/26/2017: port and PEG tube placement  12/28/2017 - 01/25/2018 Chemotherapy   The patient had dexamethasone (DECADRON) 4 MG tablet, 1 of 1 cycle, Start date: 12/12/2017, End date:  03/06/2018 palonosetron (ALOXI) injection 0.25 mg, 0.25 mg, Intravenous,  Once, 2 of 3 cycles Administration: 0.25 mg (12/28/2017), 0.25 mg (01/25/2018) CISplatin (PLATINOL) 196 mg in sodium chloride 0.9 % 500 mL chemo infusion, 100 mg/m2 = 196 mg, Intravenous,  Once, 2 of 3 cycles Dose modification: 80 mg/m2 (original dose 100 mg/m2, Cycle 2, Reason: Dose not tolerated, Comment: Neutropenia) Administration: 196 mg (12/28/2017), 157 mg (01/25/2018) fosaprepitant (EMEND) 150 mg, dexamethasone (DECADRON) 12 mg in sodium chloride 0.9 % 145 mL IVPB, , Intravenous,  Once, 2 of 3 cycles Administration:  (12/28/2017),  (01/25/2018)  for chemotherapy treatment.     Since last visit, no new clinical concerns except for sorethroat. He saw Dr Constance Holster, had evaluation which was negative.  He denies any more sore throat.  He feels back to normal. No change in PMH/PSH/FH/SH  ALLERGIES:  has No Known Allergies.  MEDICATIONS:  No current outpatient medications on file.   No current facility-administered medications for this visit.    PHYSICAL EXAMINATION: ECOG PERFORMANCE STATUS: 0 - Asymptomatic  Today's Vitals   03/01/22 1527  BP: 132/79  Pulse: 79  Resp: 16  Temp: 98.1 F (36.7 C)  TempSrc: Temporal  SpO2: 99%  Weight: 170 lb 3.2 oz (77.2 kg)  Height: '5\' 10"'$  (1.778 m)   Body mass index is 24.42 kg/m.  Filed Weights   03/01/22 1527  Weight: 170 lb 3.2 oz (77.2 kg)    Physical Exam Constitutional:      Appearance: Normal appearance.  HENT:     Head: Normocephalic and atraumatic.     Mouth/Throat:     Comments: He had a strong gag, but within the limits of the exam, I didn't see any tonsillar masses or oral cavity masses. Cardiovascular:     Rate and Rhythm: Normal rate and regular rhythm.     Pulses: Normal pulses.     Heart sounds: Normal heart sounds.  Pulmonary:     Effort: Pulmonary effort is normal.     Breath sounds: Normal breath sounds.  Abdominal:     General: Abdomen  is flat. Bowel sounds are normal.  Musculoskeletal:     Cervical back: Rigidity (neck tightness noted likely from post radiation some bulkiness in the area likely postradiation changes however recommended imaging to confirm) present.  Neurological:     Mental Status: He is alert.      LABORATORY DATA:  I have reviewed the data as listed    Component Value Date/Time   NA 143 03/02/2021 1537   K 4.4 03/02/2021 1537   CL 108 03/02/2021 1537   CO2 27 03/02/2021 1537   GLUCOSE 96 03/02/2021 1537   BUN 15 03/02/2021 1537   CREATININE 1.23 03/02/2021 1537   CREATININE 1.14 08/31/2020 1320   CALCIUM 9.6 03/02/2021 1537   PROT 7.2 03/02/2021 1537   ALBUMIN 4.4 03/02/2021 1537   AST 17 03/02/2021 1537   AST 14 (L) 08/31/2020 1320   ALT 21 03/02/2021 1537   ALT 14 08/31/2020 1320   ALKPHOS 62 03/02/2021 1537   BILITOT 0.5 03/02/2021 1537   BILITOT 0.5 08/31/2020 1320   GFRNONAA >60 03/02/2021 1537   GFRNONAA >60 08/31/2020 1320   GFRAA >60 10/09/2019 1509    No results found for: "SPEP", "UPEP"  Lab Results  Component Value Date   WBC 4.6 03/02/2021  NEUTROABS 3.6 03/02/2021   HGB 15.2 03/02/2021   HCT 46.0 03/02/2021   MCV 85.7 03/02/2021   PLT 183 03/02/2021      Chemistry      Component Value Date/Time   NA 143 03/02/2021 1537   K 4.4 03/02/2021 1537   CL 108 03/02/2021 1537   CO2 27 03/02/2021 1537   BUN 15 03/02/2021 1537   CREATININE 1.23 03/02/2021 1537   CREATININE 1.14 08/31/2020 1320      Component Value Date/Time   CALCIUM 9.6 03/02/2021 1537   ALKPHOS 62 03/02/2021 1537   AST 17 03/02/2021 1537   AST 14 (L) 08/31/2020 1320   ALT 21 03/02/2021 1537   ALT 14 08/31/2020 1320   BILITOT 0.5 03/02/2021 1537   BILITOT 0.5 08/31/2020 1320       RADIOGRAPHIC STUDIES: I spent 20 minutes or over in the care of this patient including H and P, review of records, counseling and coordination of care.   Thomas Pike MD

## 2022-03-02 ENCOUNTER — Encounter: Payer: Self-pay | Admitting: Hematology and Oncology

## 2022-03-02 LAB — TSH: TSH: 2.434 u[IU]/mL (ref 0.350–4.500)

## 2022-03-06 ENCOUNTER — Telehealth: Payer: Self-pay | Admitting: Hematology and Oncology

## 2022-03-06 NOTE — Telephone Encounter (Signed)
Patient called to schedule ct scan. Transferred patient to radiology for scheduling.

## 2022-03-07 ENCOUNTER — Telehealth: Payer: Self-pay

## 2022-03-07 NOTE — Telephone Encounter (Signed)
Pt called and states he would like to proceed with CT neck and asks for it to be done before end of 2023. He is also requesting if he is a candidate fro NavDx by [facility]. Advised pt I would consult with MD about which CT neck order she prefers, as well as the NavDx testing. Pt understands we will be back in touch tomorrow with MD recommendation.

## 2022-03-08 ENCOUNTER — Other Ambulatory Visit: Payer: Self-pay

## 2022-03-08 DIAGNOSIS — C090 Malignant neoplasm of tonsillar fossa: Secondary | ICD-10-CM

## 2022-03-15 ENCOUNTER — Ambulatory Visit (HOSPITAL_COMMUNITY)
Admission: RE | Admit: 2022-03-15 | Discharge: 2022-03-15 | Disposition: A | Discharge status: Home / Self Care | Payer: 59 | Source: Ambulatory Visit | Attending: Hematology and Oncology | Admitting: Hematology and Oncology

## 2022-03-15 DIAGNOSIS — C099 Malignant neoplasm of tonsil, unspecified: Secondary | ICD-10-CM | POA: Diagnosis not present

## 2022-03-15 DIAGNOSIS — C090 Malignant neoplasm of tonsillar fossa: Secondary | ICD-10-CM | POA: Diagnosis not present

## 2022-03-15 MED ORDER — IOHEXOL 300 MG/ML  SOLN
80.0000 mL | Freq: Once | INTRAMUSCULAR | Status: AC | PRN
  Administered 2022-03-15: 80 mL via INTRAVENOUS

## 2022-03-16 ENCOUNTER — Encounter: Payer: Self-pay | Admitting: Hematology and Oncology

## 2022-03-22 ENCOUNTER — Telehealth: Payer: Self-pay | Admitting: *Deleted

## 2022-03-22 NOTE — Telephone Encounter (Addendum)
Contacted patient per Dr. Rob Hickman message below. Informed patient that copy of report sent to PCP - via fax and electronically.  Patient requested to cancel appt on 03/23/22 with Dr. Chryl Heck as it was to go over the results of this CT. Encouraged patient to contact this office for any further questions or concerns related to this result. Patient verbalized understanding of all information. Appt 03/23/22 w/Dr. Chryl Heck cancelled as requested   ----- Message from [name], MD sent at 03/21/2022  4:35 PM EST ----- Can you give him the CT results, good news. No evidence of cancer. No other follow up imaging needed. They talk about some inflammation in the artery if his right neck swelling is bothersome. Please ask him to call his PCP to discuss further management for this, also kindly fax them a copy of the report. Thanks

## 2022-03-23 ENCOUNTER — Inpatient Hospital Stay: Payer: Commercial Managed Care - PPO | Admitting: Hematology and Oncology

## 2022-04-25 ENCOUNTER — Encounter: Payer: Self-pay | Admitting: Family Medicine

## 2022-04-25 ENCOUNTER — Ambulatory Visit: Payer: Commercial Managed Care - PPO | Admitting: Family Medicine

## 2022-04-25 ENCOUNTER — Other Ambulatory Visit (HOSPITAL_COMMUNITY): Payer: Self-pay

## 2022-04-25 VITALS — BP 132/82 | HR 73 | Temp 97.8°F | Resp 16 | Ht 70.0 in | Wt 175.4 lb

## 2022-04-25 DIAGNOSIS — L02212 Cutaneous abscess of back [any part, except buttock]: Secondary | ICD-10-CM | POA: Diagnosis not present

## 2022-04-25 DIAGNOSIS — C090 Malignant neoplasm of tonsillar fossa: Secondary | ICD-10-CM

## 2022-04-25 DIAGNOSIS — L723 Sebaceous cyst: Secondary | ICD-10-CM

## 2022-04-25 MED ORDER — DOXYCYCLINE HYCLATE 100 MG PO TABS
100.0000 mg | ORAL_TABLET | Freq: Two times a day (BID) | ORAL | 0 refills | Status: AC
  Filled 2022-04-25: qty 14, 7d supply, fill #0

## 2022-04-25 NOTE — Patient Instructions (Addendum)
A few things to remember from today's visit:  Sebaceous cyst  Abscess of back - Plan: doxycycline (VIBRA-TABS) 100 MG tablet  It is a benign cyst, mildly infected at this time. Local heat may help. Oral antibiotic sent to your pharmacy. I placed a referral to surgeon to have lesion removed.  Do not use My Chart to request refills or for acute issues that need immediate attention. If you send a my chart message, it may take a few days to be addressed, specially if I am not in the office.  Please be sure medication list is accurate. If a new problem present, please set up appointment sooner than planned today.

## 2022-04-25 NOTE — Progress Notes (Signed)
ACUTE VISIT Chief Complaint  Patient presents with   lump on back   HPI: Mr.Ramie Jaevyn Tabacco is a 62 y.o. male with PMHx of tonsillar SCC here today complaining of tender lesion on his back that has been present for one month.  Mildly pruritic. He reports that the lesion has been growing and is mildly painful, similar to a bee sting.  No hx of trauma or insect bite. There has been no drainage,states that he is unable to see or reach the lesion due to its location. He is concerned about possible serious process.  Negative for fever,chills, oral lesions, cough, wheezing,or SOB. He has not tried OTC treatments.  Since his last visit on 03/01/22 he has followed with oncologist and ENT for malignant tonsillar neoplasia stage I, s/p chemotherapy.Surgical resection was not recommended due to localization  Review of Systems  Constitutional:  Negative for activity change and appetite change.  HENT:  Negative for sore throat.   Cardiovascular:  Negative for chest pain.  Gastrointestinal:  Negative for abdominal pain, nausea and vomiting.  Skin:  Negative for rash.  Neurological:  Negative for numbness.  Hematological:  Negative for adenopathy. Does not bruise/bleed easily.  See other pertinent positives and negatives in HPI.  Current Outpatient Medications on File Prior to Visit  Medication Sig Dispense Refill   [DISCONTINUED] prochlorperazine (COMPAZINE) 10 MG tablet Take 1 tablet (10 mg total) by mouth every 6 (six) hours as needed (Nausea or vomiting). (Patient not taking: Reported on 05/21/2018) 30 tablet 1   No current facility-administered medications on file prior to visit.   Past Medical History:  Diagnosis Date   Headache    History of radiation therapy 12/27/17- 02/13/18   right tonsil and bilateral neck.    Mass    right tonsil   Wears glasses    No Known Allergies  Social History   Socioeconomic History   Marital status: Married    Spouse name: Not on file    Number of children: Not on file   Years of education: Not on file   Highest education level: Master's degree (e.g., MA, MS, MEng, MEd, MSW, MBA)  Occupational History   Not on file  Tobacco Use   Smoking status: Never   Smokeless tobacco: Never  Vaping Use   Vaping Use: Never used  Substance and Sexual Activity   Alcohol use: Not Currently   Drug use: Never   Sexual activity: Yes    Partners: Female  Other Topics Concern   Not on file  Social History Narrative   Not on file   Social Determinants of Health   Financial Resource Strain: Unknown (04/24/2022)   Overall Financial Resource Strain (CARDIA)    Difficulty of Paying Living Expenses: Patient refused  Food Insecurity: Unknown (04/24/2022)   Hunger Vital Sign    Worried About [place] in the Last Year: Patient refused    [place] in the Last Year: Patient refused  Transportation Needs: No Transportation Needs (04/24/2022)   PRAPARE - Hydrologist (Medical): No    Lack of Transportation (Non-Medical): No  Physical Activity: Insufficiently Active (04/24/2022)   Exercise Vital Sign    Days of Exercise per Week: 3 days    Minutes of Exercise per Session: 30 min  Stress: No Stress Concern Present (04/24/2022)   [place]    Feeling of Stress : Only a little  Social Connections: Unknown (04/24/2022)   Social Connection and Isolation Panel [NHANES]    Frequency of Communication with Friends and Family: Patient refused    Frequency of Social Gatherings with Friends and Family: Patient refused    Attends Religious Services: Patient refused    Active Member of Clubs or Organizations: Yes    Attends Music therapist: More than 4 times per year    Marital Status: Married   Vitals:   04/25/22 1019  BP: 132/82  Pulse: 73  Resp: 16  Temp: 97.8 F (36.6 C)  SpO2: 99%   Body mass index is 25.16  kg/m.  Physical Exam Vitals and nursing note reviewed.  Constitutional:      General: He is not in acute distress.    Appearance: He is well-developed.  HENT:     Head: Normocephalic and atraumatic.  Eyes:     Conjunctiva/sclera: Conjunctivae normal.  Cardiovascular:     Rate and Rhythm: Normal rate and regular rhythm.     Heart sounds: No murmur heard. Pulmonary:     Effort: Pulmonary effort is normal. No respiratory distress.     Breath sounds: Normal breath sounds.  Lymphadenopathy:     Cervical: No cervical adenopathy.  Skin:    General: Skin is warm.     Findings: Lesion present. No erythema or rash.       Neurological:     General: No focal deficit present.     Mental Status: He is oriented to person, place, and time.  Psychiatric:        Mood and Affect: Mood and affect normal.   ASSESSMENT AND PLAN:  Mr. Vidur was seen today for lump on back.  Diagnoses and all orders for this visit:  Abscess of back Not ready to I&D. If able to do so due to localization, warm compressions or local heat may help. Recommend oral abx.  -     doxycycline (VIBRA-TABS) 100 MG tablet; Take 1 tablet (100 mg total) by mouth 2 (two) times daily for 7 days.  Sebaceous cyst Reassured about serious process,based on hx and examination. We discussed options, he would like to have it removed, referral to general surgeon placed.  -     Ambulatory referral to General Surgery  Malignant neoplasm of tonsillar fossa (HCC) S/P chemotherapy. Annual surveillance with ENT and oncologist.  Return if symptoms worsen or fail to improve.  Aureliano Oshields G. [place], MD  [facility]. [place] office.

## 2022-05-16 ENCOUNTER — Other Ambulatory Visit (HOSPITAL_COMMUNITY): Payer: Self-pay

## 2022-05-16 ENCOUNTER — Other Ambulatory Visit: Payer: Self-pay | Admitting: Surgery

## 2022-05-16 DIAGNOSIS — L723 Sebaceous cyst: Secondary | ICD-10-CM | POA: Diagnosis not present

## 2022-05-16 DIAGNOSIS — L089 Local infection of the skin and subcutaneous tissue, unspecified: Secondary | ICD-10-CM | POA: Diagnosis not present

## 2022-05-16 MED ORDER — DOXYCYCLINE MONOHYDRATE 100 MG PO CAPS
100.0000 mg | ORAL_CAPSULE | Freq: Two times a day (BID) | ORAL | 0 refills | Status: AC
  Filled 2022-05-16: qty 14, 7d supply, fill #0

## 2022-05-24 ENCOUNTER — Other Ambulatory Visit (HOSPITAL_COMMUNITY): Payer: Self-pay

## 2022-06-05 ENCOUNTER — Other Ambulatory Visit: Payer: Self-pay

## 2022-06-05 ENCOUNTER — Encounter (HOSPITAL_BASED_OUTPATIENT_CLINIC_OR_DEPARTMENT_OTHER): Payer: Self-pay | Admitting: Surgery

## 2022-06-13 ENCOUNTER — Ambulatory Visit (HOSPITAL_BASED_OUTPATIENT_CLINIC_OR_DEPARTMENT_OTHER): Admission: RE | Admit: 2022-06-13 | Payer: Commercial Managed Care - PPO | Source: Ambulatory Visit | Admitting: Surgery

## 2022-06-13 SURGERY — EXCISION, LESION, BACK
Anesthesia: Monitor Anesthesia Care

## 2022-09-16 ENCOUNTER — Other Ambulatory Visit (HOSPITAL_COMMUNITY): Payer: Self-pay

## 2023-02-24 ENCOUNTER — Telehealth: Payer: Self-pay | Admitting: Hematology and Oncology

## 2023-02-24 NOTE — Telephone Encounter (Signed)
Spoke with patient confirming upcoming appointment  

## 2023-03-02 ENCOUNTER — Ambulatory Visit: Payer: Self-pay | Admitting: Hematology and Oncology

## 2023-04-03 ENCOUNTER — Inpatient Hospital Stay: Payer: Self-pay | Attending: Hematology and Oncology | Admitting: Hematology and Oncology

## 2023-04-03 ENCOUNTER — Inpatient Hospital Stay: Payer: Self-pay

## 2023-04-03 ENCOUNTER — Encounter: Payer: Self-pay | Admitting: Hematology and Oncology

## 2023-04-03 VITALS — BP 140/75 | HR 80 | Temp 97.7°F | Resp 17 | Wt 177.5 lb

## 2023-04-03 DIAGNOSIS — C090 Malignant neoplasm of tonsillar fossa: Secondary | ICD-10-CM

## 2023-04-03 DIAGNOSIS — Z85818 Personal history of malignant neoplasm of other sites of lip, oral cavity, and pharynx: Secondary | ICD-10-CM | POA: Insufficient documentation

## 2023-04-03 DIAGNOSIS — Z79899 Other long term (current) drug therapy: Secondary | ICD-10-CM | POA: Insufficient documentation

## 2023-04-03 LAB — CBC WITH DIFFERENTIAL/PLATELET
Abs Immature Granulocytes: 0.01 10*3/uL (ref 0.00–0.07)
Basophils Absolute: 0 10*3/uL (ref 0.0–0.1)
Basophils Relative: 1 %
Eosinophils Absolute: 0 10*3/uL (ref 0.0–0.5)
Eosinophils Relative: 1 %
HCT: 48.3 % (ref 39.0–52.0)
Hemoglobin: 16.1 g/dL (ref 13.0–17.0)
Immature Granulocytes: 0 %
Lymphocytes Relative: 19 %
Lymphs Abs: 0.8 10*3/uL (ref 0.7–4.0)
MCH: 28.5 pg (ref 26.0–34.0)
MCHC: 33.3 g/dL (ref 30.0–36.0)
MCV: 85.6 fL (ref 80.0–100.0)
Monocytes Absolute: 0.4 10*3/uL (ref 0.1–1.0)
Monocytes Relative: 9 %
Neutro Abs: 3.2 10*3/uL (ref 1.7–7.7)
Neutrophils Relative %: 70 %
Platelets: 175 10*3/uL (ref 150–400)
RBC: 5.64 MIL/uL (ref 4.22–5.81)
RDW: 12.5 % (ref 11.5–15.5)
WBC: 4.5 10*3/uL (ref 4.0–10.5)
nRBC: 0 % (ref 0.0–0.2)

## 2023-04-03 LAB — TSH: TSH: 4.028 u[IU]/mL (ref 0.350–4.500)

## 2023-04-03 LAB — CMP (CANCER CENTER ONLY)
ALT: 15 U/L (ref 0–44)
AST: 14 U/L — ABNORMAL LOW (ref 15–41)
Albumin: 4.7 g/dL (ref 3.5–5.0)
Alkaline Phosphatase: 64 U/L (ref 38–126)
Anion gap: 5 (ref 5–15)
BUN: 12 mg/dL (ref 8–23)
CO2: 30 mmol/L (ref 22–32)
Calcium: 9.6 mg/dL (ref 8.9–10.3)
Chloride: 106 mmol/L (ref 98–111)
Creatinine: 1.25 mg/dL — ABNORMAL HIGH (ref 0.61–1.24)
GFR, Estimated: >60 mL/min (ref >60)
Glucose, Bld: 104 mg/dL — ABNORMAL HIGH (ref 70–99)
Potassium: 4.1 mmol/L (ref 3.5–5.1)
Sodium: 141 mmol/L (ref 135–145)
Total Bilirubin: 0.6 mg/dL (ref 0.0–1.2)
Total Protein: 7.2 g/dL (ref 6.5–8.1)

## 2023-04-03 NOTE — Progress Notes (Signed)
 [facility] OFFICE PROGRESS NOTE  Patient Care Team: Jordan, Betty G, MD as PCP - General (Family Medicine) Izell Domino, MD as Attending Physician (Radiation Oncology) Maryelizabeth Callander, MD (Inactive) as Consulting Physician (Hematology) Daryle Heron CROME, RD as Dietitian (Nutrition) Jacelyn Lupita NOVAK, CCC-SLP as Speech Language Pathologist (Speech Pathology) Lanis Carbon, Florina CROME, PT as Physical Therapist (Physical Therapy) Jesus Oliphant, MD as Consulting Physician (Otolaryngology)  HEME/ONC OVERVIEW:  1. 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 -Last imaging with no evidence or residual, recurrent, new mucosal or sub mucosal mass lesion. No lymphadenopathy. -Last labs in Dec 2023, no hypothyroidism. He remains in remission clinically. No concerns. No tonsillar masses noted on exam. No palpable regional adenopathy. -At this time, we will see he needs one more appt with Dr Jesus, sent a message to NN - Repeat labs today. At this point, since he remained disease free for 5 yrs, we will discharge him from Medical Oncology. -He should continue annual labs for hypothyroidism eval with his PCP.  No orders of the defined types were placed in this encounter.  Return in 1 year for follow-up or sooner if needed. Amber Stalls, MD 1/14/20252:08 PM  CHIEF COMPLAINT: Follow for SCC tonsil,   INTERVAL HISTORY: [name] returns to  clinic for follow-up of squamous cell carcinoma of the right tonsil on surveillance.    SUMMARY OF ONCOLOGIC HISTORY: Oncology History  Malignant neoplasm of tonsillar fossa (HCC) (Resolved)  10/30/2017 Miscellaneous   Presented to Dr. Carlie with 3 months of R neck mass   11/07/2017 Imaging   CT neck w/ contrast:  1. Right tonsillar mass measures up to 2.6 cm, highly concerning for a squamous cell carcinoma. 2. Enlarged homogeneous right level 2 lymph node measures up to 3.9 cm, consistent with metastatic disease. 3. Adjacent right level 3 lymph node measures 1.1 cm. 4. Lucency at C4 on the left is likely degenerative. Metastatic disease is not excluded. Please correlate with findings on PET scan if used for further evaluation.   11/12/2017 Procedure   R tonsillar biopsy by Dr. Roark   11/12/2017 Pathology Results   Accession: [id number] Invasive squamous cell carcinoma; p16 diffusely positive   11/28/2017 Imaging   PET scan: 1. Right tonsillar fossa mass is intensely hypermetabolic compatible with primary neoplasm. 2. Solitary enlarged and hypermetabolic right level 2 cervical lymph node compatible with metastatic adenopathy. 3. No evidence for FDG avid hypermetabolic distant metastasis.   11/30/2017 Cancer Staging   Staging form: Pharynx - HPV-Mediated Oropharynx, AJCC 8th Edition - Clinical stage from 11/30/2017: Stage I (cT2, cN1, cM0, p16+) - Signed by Izell Domino, MD on 11/30/2017   12/07/2017 Miscellaneous   ENT evaluation to [facility] recommended against surgical resection due to the tumor extending into the right inferior nasopharynx and posterior soft palate, which would require resection of almost 1/2 of the soft palate and the right nasopharyngeal wall extending into right posterior/lateral posterior wall.    Procedure   12/26/2017: port and  PEG tube placement    12/28/2017 - 01/25/2018 Chemotherapy   The patient had dexamethasone  (DECADRON ) 4 MG tablet, 1 of 1 cycle,  Start date: 12/12/2017, End date: 03/06/2018 palonosetron  (ALOXI ) injection 0.25 mg, 0.25 mg, Intravenous,  Once, 2 of 3 cycles Administration: 0.25 mg (12/28/2017), 0.25 mg (01/25/2018) CISplatin  (PLATINOL ) 196 mg in sodium chloride  0.9 % 500 mL chemo infusion, 100 mg/m2 = 196 mg, Intravenous,  Once, 2 of 3 cycles Dose modification: 80 mg/m2 (original dose 100 mg/m2, Cycle 2, Reason: Dose not tolerated, Comment: Neutropenia) Administration: 196 mg (12/28/2017), 157 mg (01/25/2018) fosaprepitant  (EMEND ) 150 mg, dexamethasone  (DECADRON ) 12 mg in sodium chloride  0.9 % 145 mL IVPB, , Intravenous,  Once, 2 of 3 cycles Administration:  (12/28/2017),  (01/25/2018)  for chemotherapy treatment.      Discussed the use of AI scribe software for clinical note transcription with the patient, who gave verbal consent to proceed.  History of Present Illness     The patient, with a history of head and neck cancer treated with chemo-radiation five years ago, presents for a routine follow-up. He reports no new health issues since his last visit in December. He has some residual dryness and difficulty swallowing, particularly with bread, but considers this an inconvenience rather than a significant problem. His appetite is good and his weight has been stable, even increasing slightly. He has been trying to stay active. He believes he is up-to-date with other cancer screenings, including colonoscopy. He has no new procedures, hospitalizations, or medications to report.  ALLERGIES:  has no known allergies.  MEDICATIONS:  Current Outpatient Medications  Medication Sig Dispense Refill   doxycycline  (MONODOX ) 100 MG capsule Take 1 capsule (100 mg total) by mouth 2 (two) times daily for 7 days 14 capsule 0   No current facility-administered medications for this visit.    PHYSICAL EXAMINATION: ECOG PERFORMANCE STATUS: 0 - Asymptomatic  There were no vitals filed for this visit.  There is no height or weight on  file to calculate BMI.  There were no vitals filed for this visit.   Physical Exam Constitutional:      Appearance: Normal appearance.  HENT:     Head: Normocephalic and atraumatic.     Mouth/Throat:     Comments: He had a strong gag, but within the limits of the exam, I didn't see any tonsillar masses or oral cavity masses. Cardiovascular:     Rate and Rhythm: Normal rate and regular rhythm.     Pulses: Normal pulses.     Heart sounds: Normal heart sounds.  Pulmonary:     Effort: Pulmonary effort is normal.     Breath sounds: Normal breath sounds.  Abdominal:     General: Abdomen is flat. Bowel sounds are normal.  Musculoskeletal:     Cervical back: Rigidity (no palpable cervical adenopathy) present.  Neurological:     Mental Status: He is alert.      LABORATORY DATA:  I have reviewed the data as listed    Component Value Date/Time   NA 142 03/01/2022 1558   K 3.9 03/01/2022 1558   CL 105 03/01/2022 1558   CO2 30 03/01/2022 1558   GLUCOSE 101 (H) 03/01/2022 1558   BUN 12 03/01/2022 1558   CREATININE 1.22 03/01/2022 1558   CALCIUM 9.3 03/01/2022 1558   PROT 7.2 03/01/2022 1558   ALBUMIN 4.4 03/01/2022 1558   AST 20 03/01/2022 1558   ALT 17 03/01/2022 1558   ALKPHOS 57 03/01/2022 1558  BILITOT 0.6 03/01/2022 1558   GFRNONAA >60 03/01/2022 1558   GFRAA >60 10/09/2019 1509    No results found for: SPEP, UPEP  Lab Results  Component Value Date   WBC 4.4 03/01/2022   NEUTROABS 3.3 03/01/2022   HGB 14.8 03/01/2022   HCT 43.8 03/01/2022   MCV 86.6 03/01/2022   PLT 173 03/01/2022      Chemistry      Component Value Date/Time   NA 142 03/01/2022 1558   K 3.9 03/01/2022 1558   CL 105 03/01/2022 1558   CO2 30 03/01/2022 1558   BUN 12 03/01/2022 1558   CREATININE 1.22 03/01/2022 1558      Component Value Date/Time   CALCIUM 9.3 03/01/2022 1558   ALKPHOS 57 03/01/2022 1558   AST 20 03/01/2022 1558   ALT 17 03/01/2022 1558   BILITOT 0.6 03/01/2022  1558       RADIOGRAPHIC STUDIES: I spent 30 minutes in the care of this patient including H and P, review of records, counseling and coordination of care.   Amber Stalls MD

## 2023-04-04 NOTE — Progress Notes (Signed)
 Oncology Nurse Navigator Documentation   Mr. Brands completed his treatment for his head and neck cancer 5 years ago. He saw Dr. Arno Bibles yesterday and she has asked me to get him in to see Dr. Donalee Fruits ENT for evaluation. I called Mr. Grzybowski today and he plans to call Dr. Regenia Cape office to get set up with an appointment. I provided him with my direct contact information to call if he experiences any difficulties.  Lynetta Saran RN, BSN, OCN Head & Neck Oncology Nurse Navigator [facility] Phone # [phone]  Fax # [phone]
